# Patient Record
Sex: Female | Born: 1995
Health system: Southern US, Community
[De-identification: ages and names within clinical notes are randomized; demographics above are authoritative.]

## PROBLEM LIST (undated history)

## (undated) DIAGNOSIS — R011 Cardiac murmur, unspecified: Secondary | ICD-10-CM

## (undated) DIAGNOSIS — Z87442 Personal history of urinary calculi: Secondary | ICD-10-CM

## (undated) DIAGNOSIS — J45909 Unspecified asthma, uncomplicated: Secondary | ICD-10-CM

## (undated) DIAGNOSIS — F419 Anxiety disorder, unspecified: Secondary | ICD-10-CM

## (undated) DIAGNOSIS — R519 Headache, unspecified: Secondary | ICD-10-CM

## (undated) DIAGNOSIS — M419 Scoliosis, unspecified: Secondary | ICD-10-CM

## (undated) DIAGNOSIS — D649 Anemia, unspecified: Secondary | ICD-10-CM

## (undated) DIAGNOSIS — N281 Cyst of kidney, acquired: Secondary | ICD-10-CM

## (undated) DIAGNOSIS — N83209 Unspecified ovarian cyst, unspecified side: Secondary | ICD-10-CM

## (undated) DIAGNOSIS — F32A Depression, unspecified: Secondary | ICD-10-CM

## (undated) HISTORY — PX: TONSILLECTOMY: SUR1361

---
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
## 2017-04-15 ENCOUNTER — Encounter (HOSPITAL_COMMUNITY): Payer: Self-pay | Admitting: Emergency Medicine

## 2017-04-15 ENCOUNTER — Emergency Department (HOSPITAL_COMMUNITY)
Admission: EM | Admit: 2017-04-15 | Discharge: 2017-04-15 | Disposition: A | Discharge status: Home / Self Care | Payer: Self-pay | Attending: Emergency Medicine | Admitting: Emergency Medicine

## 2017-04-15 DIAGNOSIS — J029 Acute pharyngitis, unspecified: Secondary | ICD-10-CM | POA: Insufficient documentation

## 2017-04-15 LAB — RAPID STREP SCREEN (MED CTR MEBANE ONLY): Streptococcus, Group A Screen (Direct): NEGATIVE

## 2017-04-15 NOTE — ED Triage Notes (Signed)
Patient presents with S/S cold. Scratchy throat, ear ache cough x 1 day

## 2017-04-15 NOTE — ED Provider Notes (Signed)
[facility] Provider Note   CSN: [id number] Arrival date & time: 04/15/17  1620     History   Chief Complaint Chief Complaint  Patient presents with  . Cough    cold like S/S    HPI Jill Patel is a 21 y.o. female.  HPI   21 year old female presents today with complaints of upper respiratory infection.  Patient notes rhinorrhea and sore throat yesterday.  She notes pain in the bilateral ears.  She denies any fever, productive cough, chest pain or shortness of breath.  No nausea vomiting or abdominal pain.  Patient reports numerous close sick contacts with similar symptoms.  Patient notes history of tonsillectomy in the past.  No medications prior to arrival.       Past Medical History:  Diagnosis Date  . Asthma     There are no active problems to display for this patient.   Past Surgical History:  Procedure Laterality Date  . TONSILLECTOMY      OB History    Gravida Para Term Preterm AB Living   0             SAB TAB Ectopic Multiple Live Births                   Home Medications    Prior to Admission medications   Not on File    Family History Family History  Problem Relation Age of Onset  . Diabetes Maternal Grandfather   . Heart disease Maternal Grandfather   . Cancer Maternal Grandfather     Social History Social History  Substance Use Topics  . Smoking status: Never Smoker  . Smokeless tobacco: Never Used  . Alcohol use No     Allergies   Kiwi extract; Aleve [naproxen sodium]; Mango flavor; Other; and Pineapple   Review of Systems Review of Systems  All other systems reviewed and are negative.    Physical Exam Updated Vital Signs BP 101/63 (BP Location: Left Arm)   Pulse 82   Temp 98.4 F (36.9 C) (Oral)   Resp 16   LMP 03/18/2017   SpO2 100%   Physical Exam  Constitutional: She is oriented to person, place, and time. She appears well-developed and well-nourished.  HENT:  Head: Normocephalic and  atraumatic.  Eyes: Pupils are equal, round, and reactive to light. Conjunctivae are normal. Right eye exhibits no discharge. Left eye exhibits no discharge. No scleral icterus.  Neck: Normal range of motion. No JVD present. No tracheal deviation present.  Cardiovascular: Normal rate, regular rhythm, normal heart sounds and intact distal pulses.  Exam reveals no gallop and no friction rub.   No murmur heard. Pulmonary/Chest: Effort normal and breath sounds normal. No stridor. No respiratory distress. She has no wheezes. She has no rales. She exhibits no tenderness.  Neurological: She is alert and oriented to person, place, and time. Coordination normal.  Psychiatric: She has a normal mood and affect. Her behavior is normal. Judgment and thought content normal.  Nursing note and vitals reviewed.    ED Treatments / Results  Labs (all labs ordered are listed, but only abnormal results are displayed) Labs Reviewed  RAPID STREP SCREEN (NOT AT [facility])  CULTURE, GROUP A STREP [facility])    EKG  EKG Interpretation None       Radiology No results found.  Procedures Procedures (including critical care time)  Medications Ordered in ED Medications - No data to display   Initial Impression / Assessment and Plan /  ED Course  I have reviewed the triage vital signs and the nursing notes.  Pertinent labs & imaging results that were available during my care of the patient were reviewed by me and considered in my medical decision making (see chart for details).      Final Clinical Impressions(s) / ED Diagnoses   Final diagnoses:  Viral pharyngitis    Labs:   Imaging:  Consults:  Therapeutics:  Discharge Meds:   Assessment/Plan: 21 year old female presents today with likely viral URI.  She is well-appearing in no acute distress.  No signs of bacterial infection.  Symptomatic care instructions given strict return precautions given.  Patient verbalized understanding and agreement to  today's plan had no further questions or concerns the time discharge.      New Prescriptions There are no discharge medications for this patient.    Eyvonne Mechanic, PA-C 04/15/17 2038    Pricilla Loveless, MD 04/17/17 [phone]

## 2017-04-15 NOTE — Discharge Instructions (Signed)
Please read attached information. If you experience any new or worsening signs or symptoms please return to the emergency room for evaluation. Please follow-up with your primary care provider or specialist as discussed.  °

## 2017-04-18 LAB — CULTURE, GROUP A STREP (THRC)

## 2017-11-06 ENCOUNTER — Ambulatory Visit (HOSPITAL_COMMUNITY)
Admission: EM | Admit: 2017-11-06 | Discharge: 2017-11-06 | Disposition: A | Discharge status: Home / Self Care | Payer: BC Managed Care – PPO | Attending: Family Medicine | Admitting: Family Medicine

## 2017-11-06 ENCOUNTER — Encounter (HOSPITAL_COMMUNITY): Payer: Self-pay | Admitting: Emergency Medicine

## 2017-11-06 ENCOUNTER — Other Ambulatory Visit: Payer: Self-pay

## 2017-11-06 DIAGNOSIS — J321 Chronic frontal sinusitis: Secondary | ICD-10-CM | POA: Diagnosis not present

## 2017-11-06 MED ORDER — AMOXICILLIN-POT CLAVULANATE 875-125 MG PO TABS: 1.0000 | ORAL_TABLET | Freq: Two times a day (BID) | ORAL | 0 refills | Status: DC

## 2017-11-06 MED ORDER — PREDNISONE 10 MG PO TABS: 40.0000 mg | ORAL_TABLET | Freq: Every day | ORAL | 0 refills | Status: AC

## 2017-11-06 NOTE — ED Triage Notes (Signed)
C/o "stuffy" nose, slight cough and sore throat

## 2017-11-06 NOTE — ED Provider Notes (Signed)
[facility]    CSN: [id number] Arrival date & time: 11/06/17  1814     History   Chief Complaint Chief Complaint  Patient presents with  . Facial Pain    HPI Jill Patel is a 22 y.o. female.   22 year old female, with history of asthma, presenting today due to possible sinusitis.  Patient has had facial pressure, nasal congestion and cough for the past week.  Symptoms been gradually worsening.  Patient does have a history of recurrent sinusitis.  Denies any fever or chills.  The history is provided by the patient.  URI  Presenting symptoms: congestion and cough   Presenting symptoms: no ear pain, no fever and no sore throat   Severity:  Moderate Onset quality:  Gradual Duration:  1 week Timing:  Constant Progression:  Worsening Chronicity:  Recurrent Relieved by:  Nothing Worsened by:  Nothing Ineffective treatments:  None tried Associated symptoms: sinus pain   Associated symptoms: no arthralgias, no headaches, no myalgias, no neck pain, no sneezing and no swollen glands   Risk factors: sick contacts   Risk factors: not elderly, no chronic cardiac disease, no chronic kidney disease, no chronic respiratory disease, no diabetes mellitus, no recent illness and no recent travel     Past Medical History:  Diagnosis Date  . Asthma     There are no active problems to display for this patient.   Past Surgical History:  Procedure Laterality Date  . TONSILLECTOMY      OB History    Gravida  0   Para      Term      Preterm      AB      Living        SAB      TAB      Ectopic      Multiple      Live Births               Home Medications    Prior to Admission medications   Medication Sig Start Date End Date Taking? Authorizing Provider  amoxicillin-clavulanate (AUGMENTIN) 875-125 MG tablet Take 1 tablet by mouth every 12 (twelve) hours. 11/06/17   Ahyana Skillin C, PA-C  predniSONE (DELTASONE) 10 MG tablet Take 4 tablets (40 mg  total) by mouth daily for 5 days. 11/06/17 11/11/17  Alecia Lemming, PA-C    Family History Family History  Problem Relation Age of Onset  . Diabetes Maternal Grandfather   . Heart disease Maternal Grandfather   . Cancer Maternal Grandfather     Social History Social History   Tobacco Use  . Smoking status: Never Smoker  . Smokeless tobacco: Never Used  Substance Use Topics  . Alcohol use: No  . Drug use: No     Allergies   Kiwi extract; Aleve [naproxen sodium]; Mango flavor; Other; and Pineapple   Review of Systems Review of Systems  Constitutional: Negative for chills and fever.  HENT: Positive for congestion and sinus pain. Negative for ear pain, sneezing and sore throat.   Eyes: Negative for pain and visual disturbance.  Respiratory: Positive for cough. Negative for shortness of breath.   Cardiovascular: Negative for chest pain and palpitations.  Gastrointestinal: Negative for abdominal pain and vomiting.  Genitourinary: Negative for dysuria and hematuria.  Musculoskeletal: Negative for arthralgias, back pain, myalgias and neck pain.  Skin: Negative for color change and rash.  Neurological: Negative for seizures, syncope and headaches.  All other systems reviewed  and are negative.    Physical Exam Triage Vital Signs ED Triage Vitals [11/06/17 1909]  Enc Vitals Group     BP 108/70     Pulse Rate 84     Resp      Temp 97.8 F (36.6 C)     Temp Source Oral     SpO2 100 %     Weight      Height      Head Circumference      Peak Flow      Pain Score      Pain Loc      Pain Edu?      Excl. in GC?    No data found.  Updated Vital Signs BP 108/70 (BP Location: Left Arm)   Pulse 84   Temp 97.8 F (36.6 C) (Oral)   LMP 11/04/2017   SpO2 100%   Visual Acuity Right Eye Distance:   Left Eye Distance:   Bilateral Distance:    Right Eye Near:   Left Eye Near:    Bilateral Near:     Physical Exam  Constitutional: She appears well-developed and  well-nourished. No distress.  HENT:  Head: Normocephalic and atraumatic.  Right Ear: Hearing, tympanic membrane, external ear and ear canal normal.  Left Ear: Hearing, tympanic membrane, external ear and ear canal normal.  Nose: Right sinus exhibits maxillary sinus tenderness and frontal sinus tenderness. Left sinus exhibits maxillary sinus tenderness and frontal sinus tenderness.  Mouth/Throat: Oropharynx is clear and moist. No oropharyngeal exudate, posterior oropharyngeal edema, posterior oropharyngeal erythema or tonsillar abscesses.  Eyes: Conjunctivae are normal.  Neck: Neck supple.  Cardiovascular: Normal rate and regular rhythm.  No murmur heard. Pulmonary/Chest: Effort normal and breath sounds normal. No stridor. No respiratory distress. She has no wheezes. She has no rhonchi. She has no rales.  Abdominal: Soft. There is no tenderness.  Musculoskeletal: She exhibits no edema.  Neurological: She is alert.  Skin: Skin is warm and dry.  Psychiatric: She has a normal mood and affect.  Nursing note and vitals reviewed.    UC Treatments / Results  Labs (all labs ordered are listed, but only abnormal results are displayed) Labs Reviewed - No data to display  EKG None Radiology No results found.  Procedures Procedures (including critical care time)  Medications Ordered in UC Medications - No data to display   Initial Impression / Assessment and Plan / UC Course  I have reviewed the triage vital signs and the nursing notes.  Pertinent labs & imaging results that were available during my care of the patient were reviewed by me and considered in my medical decision making (see chart for details).     Sinusitis - will treat with augmentin and prednisone  Final Clinical Impressions(s) / UC Diagnoses   Final diagnoses:  Chronic frontal sinusitis    ED Discharge Orders        Ordered    amoxicillin-clavulanate (AUGMENTIN) 875-125 MG tablet  Every 12 hours      11/06/17 1915    predniSONE (DELTASONE) 10 MG tablet  Daily     11/06/17 1915       Controlled Substance Prescriptions [place] Controlled Substance Registry consulted? Not Applicable   Cameron AliBlue, Starlina Lapre C, PA-C 11/06/17 1922

## 2017-12-26 ENCOUNTER — Encounter (HOSPITAL_COMMUNITY): Payer: Self-pay | Admitting: Family Medicine

## 2017-12-26 ENCOUNTER — Ambulatory Visit (HOSPITAL_COMMUNITY)
Admission: EM | Admit: 2017-12-26 | Discharge: 2017-12-26 | Disposition: A | Discharge status: Home / Self Care | Payer: BC Managed Care – PPO | Attending: Family Medicine | Admitting: Family Medicine

## 2017-12-26 DIAGNOSIS — B354 Tinea corporis: Secondary | ICD-10-CM

## 2017-12-26 MED ORDER — KETOCONAZOLE 2 % EX CREA: 1.0000 "application " | TOPICAL_CREAM | Freq: Every day | CUTANEOUS | 0 refills | Status: DC

## 2017-12-26 NOTE — ED Provider Notes (Signed)
[facility]    CSN: [id number] Arrival date & time: 12/26/17  1758     History   Chief Complaint Chief Complaint  Patient presents with  . Rash    HPI Jill Patel is a 22 y.o. female.   HPI  Patient's had a rash for 2 weeks.  It is present on her arms chest and back.  It does not itch.  She has not used any creams.  She is here because it appears to be getting worse.  She is in good health and on no medications.  Past Medical History:  Diagnosis Date  . Asthma     There are no active problems to display for this patient.   Past Surgical History:  Procedure Laterality Date  . TONSILLECTOMY      OB History    Gravida  0   Para      Term      Preterm      AB      Living        SAB      TAB      Ectopic      Multiple      Live Births               Home Medications    Prior to Admission medications   Medication Sig Start Date End Date Taking? Authorizing Provider  ketoconazole (NIZORAL) 2 % cream Apply 1 application topically daily. 12/26/17   Eustace MooreNelson, Aristea Posada Sue, MD    Family History Family History  Problem Relation Age of Onset  . Diabetes Maternal Grandfather   . Heart disease Maternal Grandfather   . Cancer Maternal Grandfather     Social History Social History   Tobacco Use  . Smoking status: Never Smoker  . Smokeless tobacco: Never Used  Substance Use Topics  . Alcohol use: No  . Drug use: No     Allergies   Kiwi extract; Aleve [naproxen sodium]; Mango flavor; Other; and Pineapple   Review of Systems Review of Systems  Constitutional: Negative for chills and fever.  HENT: Negative for ear pain and sore throat.   Eyes: Negative for pain and visual disturbance.  Respiratory: Negative for cough and shortness of breath.   Cardiovascular: Negative for chest pain and palpitations.  Gastrointestinal: Negative for abdominal pain and vomiting.  Genitourinary: Negative for dysuria and hematuria.    Musculoskeletal: Negative for arthralgias and back pain.  Skin: Positive for rash. Negative for color change.  Neurological: Negative for seizures and syncope.  All other systems reviewed and are negative.    Physical Exam Triage Vital Signs ED Triage Vitals  Enc Vitals Group     BP 12/26/17 1818 105/63     Pulse Rate 12/26/17 1818 80     Resp 12/26/17 1818 18     Temp 12/26/17 1818 98.2 F (36.8 C)     Temp src --      SpO2 12/26/17 1818 100 %     Weight --      Height --      Head Circumference --      Peak Flow --      Pain Score 12/26/17 1817 0     Pain Loc --      Pain Edu? --      Excl. in GC? --    No data found.  Updated Vital Signs BP 105/63   Pulse 80   Temp 98.2 F (36.8 C)  Resp 18   SpO2 100%   Visual Acuity Right Eye Distance:   Left Eye Distance:   Bilateral Distance:    Right Eye Near:   Left Eye Near:    Bilateral Near:     Physical Exam  Constitutional: She appears well-developed and well-nourished. No distress.  HENT:  Head: Normocephalic and atraumatic.  Mouth/Throat: Oropharynx is clear and moist.  Eyes: Pupils are equal, round, and reactive to light. Conjunctivae are normal.  Neck: Normal range of motion.  Cardiovascular: Normal rate.  Pulmonary/Chest: Effort normal. No respiratory distress.  Abdominal: Soft. She exhibits no distension.  Musculoskeletal: Normal range of motion. She exhibits no edema.  Neurological: She is alert.  Skin: Skin is warm and dry.  On skin of right bicep, left breast, and mid back there are multiple circular to ovoid regions.  Annular.  Scaling around the edge and clear in the center.     UC Treatments / Results  Labs (all labs ordered are listed, but only abnormal results are displayed) Labs Reviewed - No data to display  EKG None  Radiology No results found.  Procedures Procedures (including critical care time)  Medications Ordered in UC Medications - No data to display  Initial  Impression / Assessment and Plan / UC Course  I have reviewed the triage vital signs and the nursing notes.  Pertinent labs & imaging results that were available during my care of the patient were reviewed by me and considered in my medical decision making (see chart for details).     Discussed tinea corporis.  Causes, treatment, care Final Clinical Impressions(s) / UC Diagnoses   Final diagnoses:  Tinea corporis     Discharge Instructions     Apply once a day Return as needed   ED Prescriptions    Medication Sig Dispense Auth. Provider   ketoconazole (NIZORAL) 2 % cream Apply 1 application topically daily. 15 g Eustace Moore, MD     Controlled Substance Prescriptions [place] Controlled Substance Registry consulted? Not Applicable   Eustace Moore, MD 12/26/17 [phone]

## 2017-12-26 NOTE — Discharge Instructions (Signed)
Apply once a day Return as needed

## 2017-12-26 NOTE — ED Triage Notes (Signed)
Pt here for circular dry patches to skin. 1 on her right arm, 1 on leg and 2 on breast. sts they don't itch and this has been x 1 month.

## 2018-05-02 ENCOUNTER — Other Ambulatory Visit: Payer: Self-pay

## 2018-05-02 ENCOUNTER — Encounter (HOSPITAL_COMMUNITY): Payer: Self-pay

## 2018-05-02 ENCOUNTER — Emergency Department (HOSPITAL_COMMUNITY)
Admission: EM | Admit: 2018-05-02 | Discharge: 2018-05-03 | Disposition: A | Discharge status: Home / Self Care | Payer: BC Managed Care – PPO | Attending: Emergency Medicine | Admitting: Emergency Medicine

## 2018-05-02 DIAGNOSIS — J45909 Unspecified asthma, uncomplicated: Secondary | ICD-10-CM | POA: Insufficient documentation

## 2018-05-02 DIAGNOSIS — T7621XA Adult sexual abuse, suspected, initial encounter: Secondary | ICD-10-CM | POA: Diagnosis present

## 2018-05-02 DIAGNOSIS — T7421XA Adult sexual abuse, confirmed, initial encounter: Secondary | ICD-10-CM | POA: Diagnosis not present
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
## 2018-05-03 LAB — PREGNANCY, URINE: PREG TEST UR: NEGATIVE

## 2018-05-03 LAB — POC URINE PREG, ED: Preg Test, Ur: NEGATIVE

## 2018-05-03 MED ORDER — ULIPRISTAL ACETATE 30 MG PO TABS
30.0000 mg | ORAL_TABLET | Freq: Once | ORAL | Status: AC
  Administered 2018-05-03: 30 mg via ORAL
  Filled 2018-05-03: qty 1

## 2018-05-03 MED ORDER — ONDANSETRON 8 MG PO TBDP
8.0000 mg | ORAL_TABLET | Freq: Once | ORAL | Status: AC
  Administered 2018-05-03: 8 mg via ORAL
  Filled 2018-05-03: qty 1
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
## 2018-05-03 NOTE — SANE Note (Signed)
Follow-up Phone Call  Patient gives verbal consent for a FNE/SANE follow-up phone call in 48-72 hours: NO Patient's telephone number: [phone] Patient gives verbal consent to leave voicemail at the phone number listed above: No DO NOT CALL between the hours of: No consent to call

## 2018-07-08 IMAGING — US US ART/VEN ABD/PELV/SCROTUM DOPPLER LTD
1 series · 13 of 25 positions shown · non-contrast
Comparison: None

CLINICAL DATA: Left lower quadrant pain for 1 day

EXAM:
TRANSABDOMINAL AND TRANSVAGINAL ULTRASOUND OF PELVIS
DOPPLER ULTRASOUND OF OVARIES
TECHNIQUE: Both transabdominal and transvaginal ultrasound examinations of the
pelvis were performed. Transabdominal technique was performed for
global imaging of the pelvis including uterus, ovaries, adnexal
regions, and pelvic cul-de-sac.
It was necessary to proceed with endovaginal exam following the
transabdominal exam to visualize the uterus and right ovary. Color
and duplex Doppler ultrasound was utilized to evaluate blood flow to
the ovaries.

[Series 1: us art/ven abd/pelv/scrotum doppler ltd · 0.20mm/px · 13 of 106 slices shown]
[im 1/106]
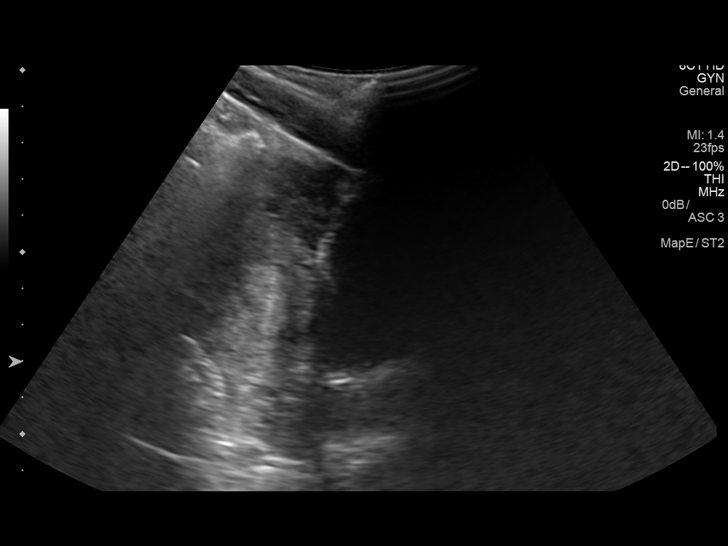
[im 9/106]
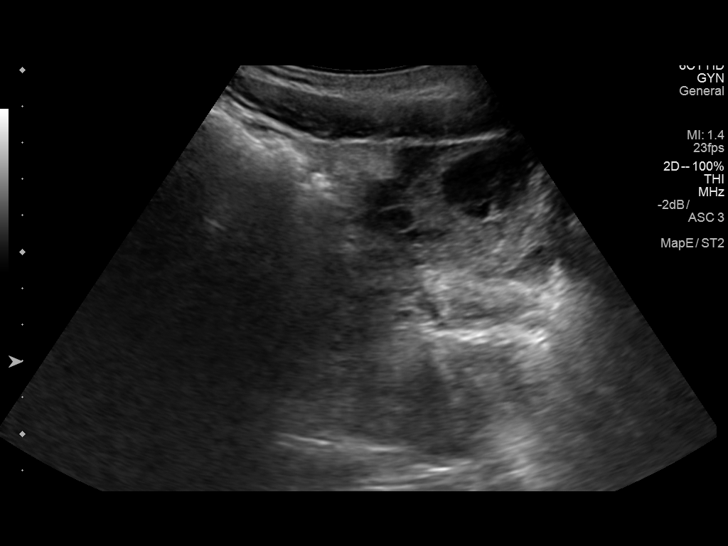
[im 18/106]
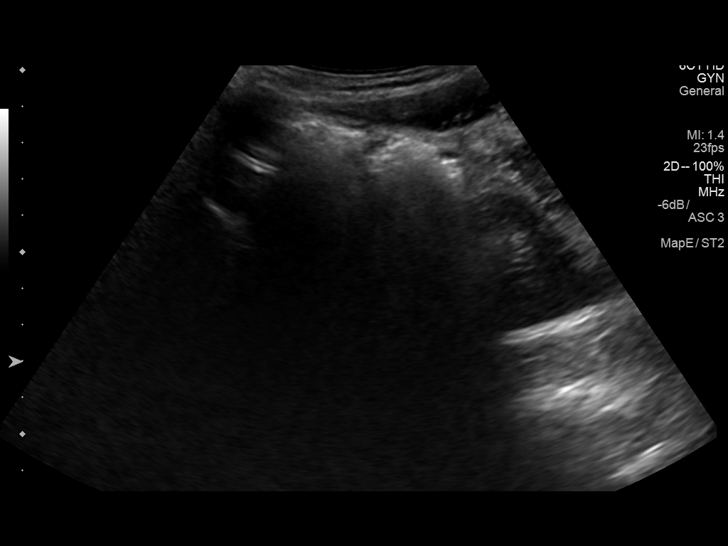
[im 27/106]
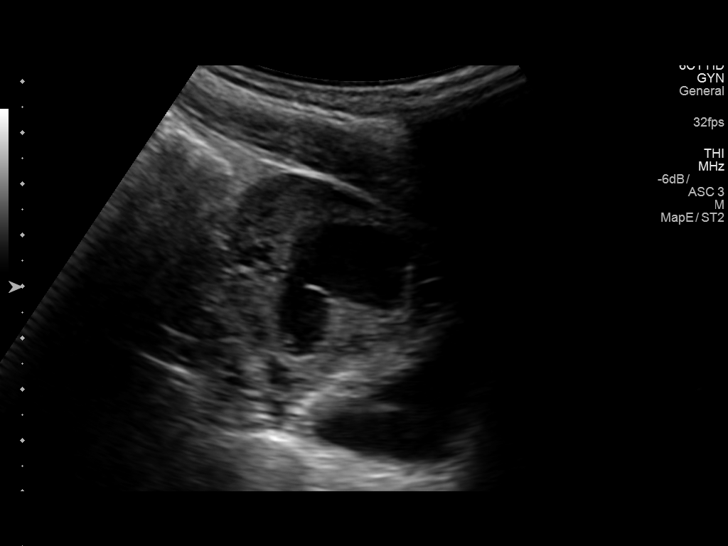
[im 36/106]
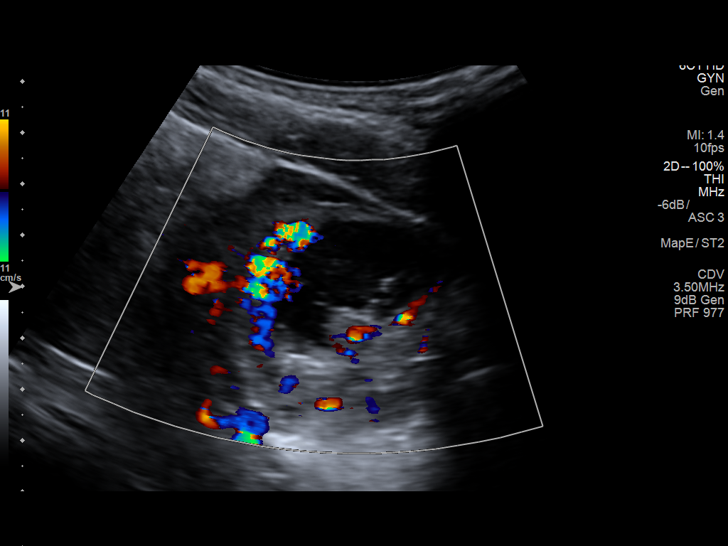
[im 44/106]
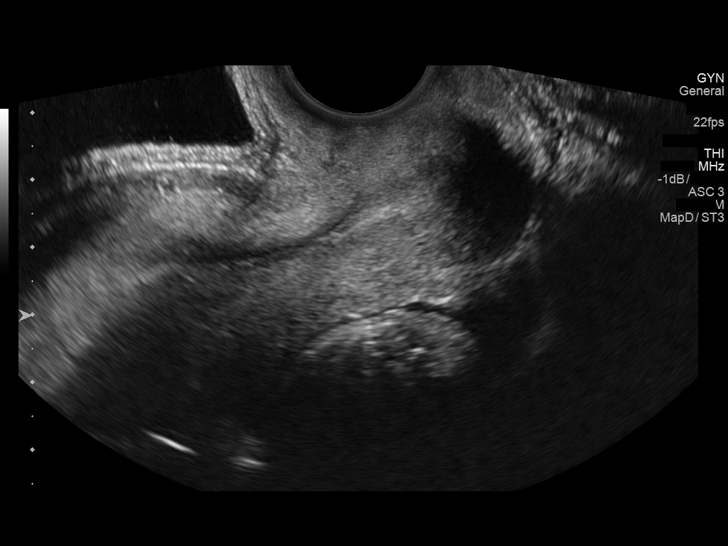
[im 53/106]
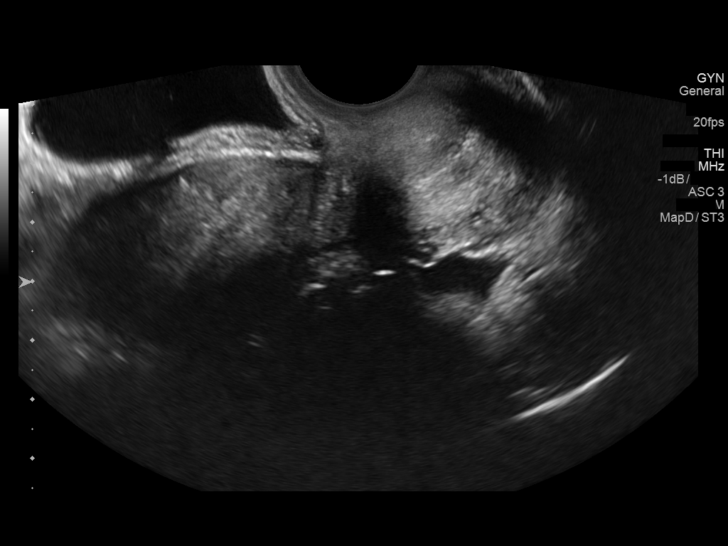
[im 62/106]
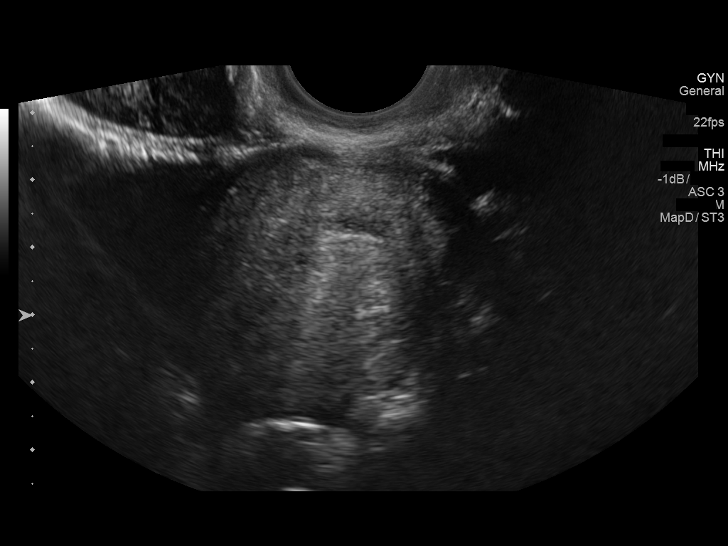
[im 71/106]
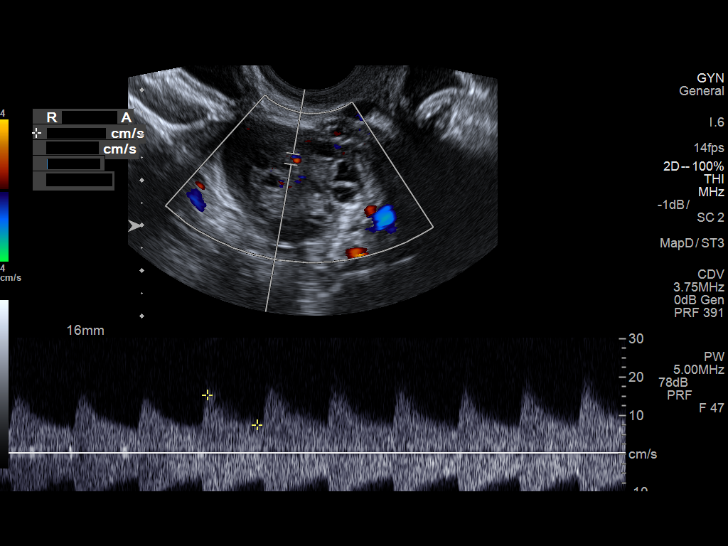
[im 79/106]
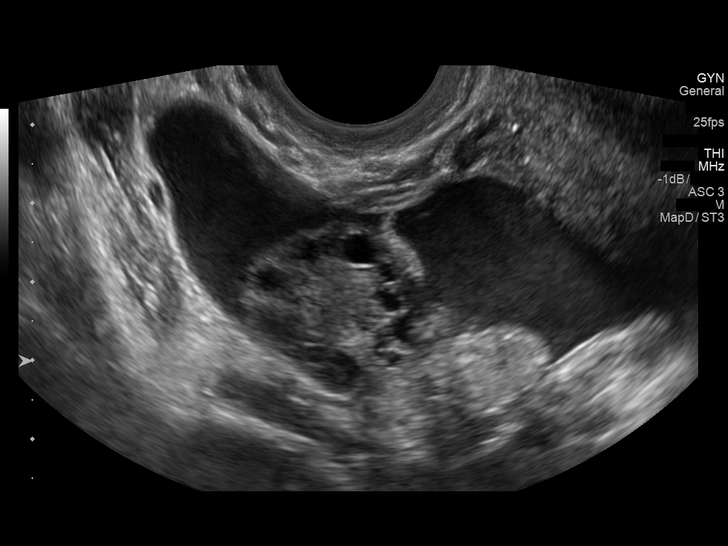
[im 88/106]
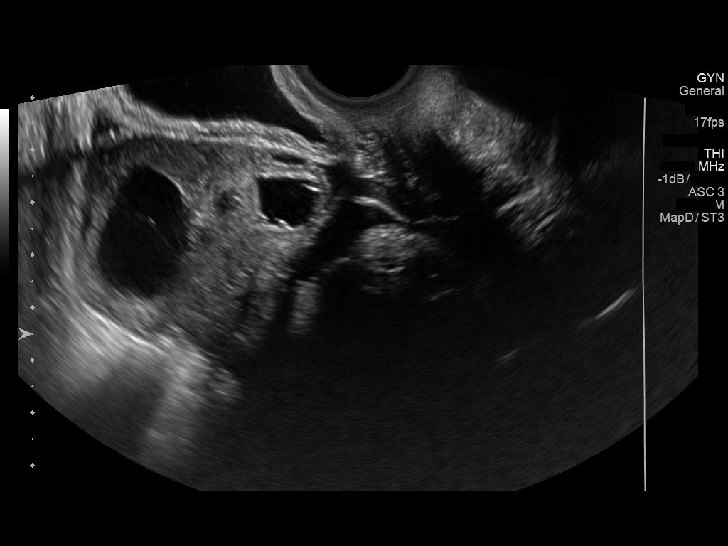
[im 97/106]
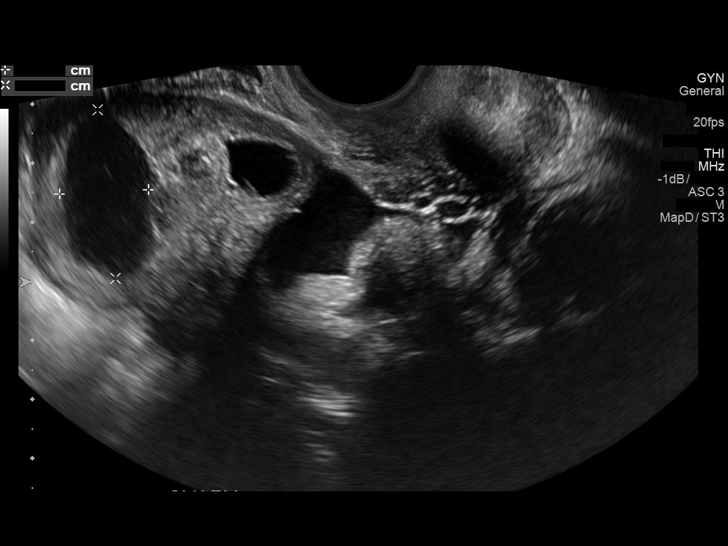
[im 106/106]
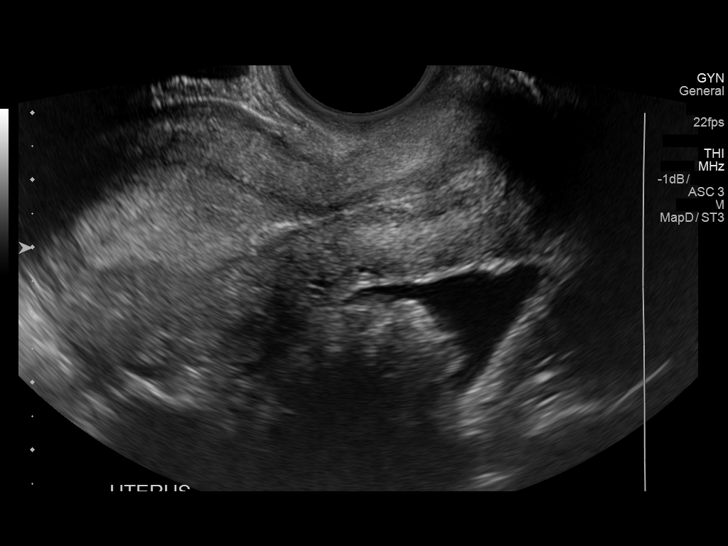

[13 of 25 positions shown; findings below may reference images not displayed]

FINDINGS: Uterus

Measurements: 6.7 x 3.7 x 4.4 cm. No fibroids or other mass
visualized.

Endometrium

Thickness: 12 mm.  No focal abnormality visualized.

Right ovary

Measurements: 2.4 x 2.3 x 2.2 cm. Normal appearance/no adnexal mass.

Left ovary

Measurements: 5.3 x 4 x 3.4 cm. Mildly complex cyst in the left
ovary measuring 1.5 x 2.9 x 2 cm.

Pulsed Doppler evaluation of both ovaries demonstrates normal
low-resistance arterial and venous waveforms.

Other findings

Small amount of free fluid within the pelvis and bilateral  adnexa.
IMPRESSION: 1. No sonographic evidence for ovarian torsion
2. Small free fluid in the pelvis and bilateral adnexum. Complex
appearing cyst left ovary measuring up to 2.9 cm, possible
hemorrhagic cyst.

## 2020-10-12 ENCOUNTER — Ambulatory Visit
Admission: EM | Admit: 2020-10-12 | Discharge: 2020-10-12 | Disposition: A | Discharge status: Home / Self Care | Payer: BC Managed Care – PPO | Attending: Emergency Medicine | Admitting: Emergency Medicine

## 2020-10-12 ENCOUNTER — Other Ambulatory Visit: Payer: Self-pay

## 2020-10-12 DIAGNOSIS — N898 Other specified noninflammatory disorders of vagina: Secondary | ICD-10-CM | POA: Insufficient documentation

## 2020-10-12 DIAGNOSIS — R102 Pelvic and perineal pain: Secondary | ICD-10-CM | POA: Insufficient documentation

## 2020-10-12 DIAGNOSIS — N926 Irregular menstruation, unspecified: Secondary | ICD-10-CM | POA: Insufficient documentation

## 2020-10-12 LAB — POCT URINE PREGNANCY: Preg Test, Ur: NEGATIVE

## 2020-10-12 MED ORDER — ONDANSETRON 4 MG PO TBDP: 4.0000 mg | ORAL_TABLET | Freq: Three times a day (TID) | ORAL | 0 refills | Status: DC | PRN

## 2020-10-12 MED ORDER — ACETAMINOPHEN-CAFF-PYRILAMINE 500-60-15 MG PO TABS: 2.0000 | ORAL_TABLET | Freq: Three times a day (TID) | ORAL | 0 refills | Status: DC | PRN

## 2020-10-12 NOTE — ED Provider Notes (Signed)
[facility]    CSN: [id number] Arrival date & time: 10/12/20  1417      History   Chief Complaint Chief Complaint  Patient presents with  . Amenorrhea    HPI Jill Patel is a 25 y.o. female history of asthma presenting today for evaluation of late menstrual cycle..  Reports last menstrual cycle was mid February, was supposed to start on 3/19, but is approximately 5 days late.  She has had some cramping in lower abdomen recently which is typical for her menstrual cycles.  She is a nauseous in the mornings.  Reports some hot flashes and increased gas of recently.  She is not on birth control.  Does report increase in discharge recently, but denies other pelvic symptoms.  HPI  Past Medical History:  Diagnosis Date  . Asthma     There are no problems to display for this patient.   Past Surgical History:  Procedure Laterality Date  . TONSILLECTOMY      OB History    Gravida  0   Para      Term      Preterm      AB      Living        SAB      IAB      Ectopic      Multiple      Live Births               Home Medications    Prior to Admission medications   Medication Sig Start Date End Date Taking? Authorizing Provider  Acetaminophen-Caff-Pyrilamine 500-60-15 MG TABS Take 2 tablets by mouth every 8 (eight) hours as needed (cramping). 10/12/20  Yes Makisha Marrin C, PA-C  ondansetron (ZOFRAN ODT) 4 MG disintegrating tablet Take 1 tablet (4 mg total) by mouth every 8 (eight) hours as needed for nausea or vomiting. 10/12/20  Yes Iqra Rotundo, Barrett C, PA-C    Family History Family History  Problem Relation Age of Onset  . Diabetes Maternal Grandfather   . Heart disease Maternal Grandfather   . Cancer Maternal Grandfather     Social History Social History   Tobacco Use  . Smoking status: Never Smoker  . Smokeless tobacco: Never Used  Vaping Use  . Vaping Use: Some days  . Substances: Nicotine, Flavoring  Substance Use  Topics  . Alcohol use: No  . Drug use: No     Allergies   Kiwi extract, Aleve [naproxen sodium], Mango flavor, Other, and Pineapple   Review of Systems Review of Systems  Constitutional: Negative for fever.  Respiratory: Negative for shortness of breath.   Cardiovascular: Negative for chest pain.  Gastrointestinal: Positive for nausea. Negative for abdominal pain, diarrhea and vomiting.  Genitourinary: Positive for menstrual problem. Negative for dysuria, flank pain, genital sores, hematuria, vaginal bleeding, vaginal discharge and vaginal pain.  Musculoskeletal: Negative for back pain.  Skin: Negative for rash.  Neurological: Negative for dizziness, light-headedness and headaches.     Physical Exam Triage Vital Signs ED Triage Vitals  Enc Vitals Group     BP 10/12/20 1511 109/71     Pulse Rate 10/12/20 1511 77     Resp 10/12/20 1511 18     Temp 10/12/20 1511 98.3 F (36.8 C)     Temp Source 10/12/20 1511 Oral     SpO2 10/12/20 1511 98 %     Weight --      Height --  Head Circumference --      Peak Flow --      Pain Score 10/12/20 1509 0     Pain Loc --      Pain Edu? --      Excl. in GC? --    No data found.  Updated Vital Signs BP 109/71 (BP Location: Left Arm)   Pulse 77   Temp 98.3 F (36.8 C) (Oral)   Resp 18   LMP 09/08/2020 (Exact Date)   SpO2 98%   Visual Acuity Right Eye Distance:   Left Eye Distance:   Bilateral Distance:    Right Eye Near:   Left Eye Near:    Bilateral Near:     Physical Exam Vitals and nursing note reviewed.  Constitutional:      Appearance: She is well-developed.     Comments: No acute distress  HENT:     Head: Normocephalic and atraumatic.     Nose: Nose normal.  Eyes:     Conjunctiva/sclera: Conjunctivae normal.  Cardiovascular:     Rate and Rhythm: Normal rate.  Pulmonary:     Effort: Pulmonary effort is normal. No respiratory distress.  Abdominal:     General: There is no distension.  Musculoskeletal:         General: Normal range of motion.     Cervical back: Neck supple.  Skin:    General: Skin is warm and dry.  Neurological:     Mental Status: She is alert and oriented to person, place, and time.      UC Treatments / Results  Labs (all labs ordered are listed, but only abnormal results are displayed) Labs Reviewed  POCT URINE PREGNANCY  CERVICOVAGINAL ANCILLARY ONLY    EKG   Radiology No results found.  Procedures Procedures (including critical care time)  Medications Ordered in UC Medications - No data to display  Initial Impression / Assessment and Plan / UC Course  I have reviewed the triage vital signs and the nursing notes.  Pertinent labs & imaging results that were available during my care of the patient were reviewed by me and considered in my medical decision making (see chart for details).     Pregnancy test negative, screening for STDs with vaginal swab, treating symptomatically and supportively with Zofran and Midol as needed for nausea and cramping.  Continue to monitor for onset of cycle and follow-up with OB/GYN if continuing to have amenorrhea.  Discussed strict return precautions. Patient verbalized understanding and is agreeable with plan.  Final Clinical Impressions(s) / UC Diagnoses   Final diagnoses:  Pelvic cramping  Vaginal discharge  Menstrual period late     Discharge Instructions     Pregnancy test negative Vaginal swab pending to screen for STDs/discharge causes Zofran as needed for nausea May use Midol as needed for cramping Follow-up if not improving or worsening   ED Prescriptions    Medication Sig Dispense Auth. Provider   ondansetron (ZOFRAN ODT) 4 MG disintegrating tablet Take 1 tablet (4 mg total) by mouth every 8 (eight) hours as needed for nausea or vomiting. 20 tablet Bertrum Helmstetter C, PA-C   Acetaminophen-Caff-Pyrilamine 500-60-15 MG TABS Take 2 tablets by mouth every 8 (eight) hours as needed (cramping). 20  tablet Aubry Rankin, [place] C, PA-C     PDMP not reviewed this encounter.   Lew Dawes, [place] 10/12/20 1552

## 2020-10-12 NOTE — Discharge Instructions (Addendum)
Pregnancy test negative Vaginal swab pending to screen for STDs/discharge causes Zofran as needed for nausea May use Midol as needed for cramping Follow-up if not improving or worsening

## 2020-10-12 NOTE — ED Triage Notes (Signed)
Pt presents for late period.  Was due for period on 03/19.  Took test on 03/21 and it was negative.  Has had unprotected intercourse in the last month.  Was told by sister that her food cravings are weird.  Also reports gas x 3 days and hot flashes.

## 2020-10-13 ENCOUNTER — Telehealth (HOSPITAL_COMMUNITY): Payer: Self-pay | Admitting: Emergency Medicine

## 2020-10-13 LAB — CERVICOVAGINAL ANCILLARY ONLY
Bacterial Vaginitis (gardnerella): POSITIVE — AB
Candida Glabrata: NEGATIVE
Candida Vaginitis: NEGATIVE
Chlamydia: POSITIVE — AB
Comment: NEGATIVE
Comment: NEGATIVE
Comment: NEGATIVE
Comment: NEGATIVE
Comment: NEGATIVE
Comment: NORMAL
Neisseria Gonorrhea: NEGATIVE
Trichomonas: POSITIVE — AB

## 2020-10-13 MED ORDER — DOXYCYCLINE HYCLATE 100 MG PO CAPS: 100.0000 mg | ORAL_CAPSULE | Freq: Two times a day (BID) | ORAL | 0 refills | Status: AC

## 2020-10-13 MED ORDER — METRONIDAZOLE 500 MG PO TABS: 500.0000 mg | ORAL_TABLET | Freq: Two times a day (BID) | ORAL | 0 refills | Status: DC

## 2020-11-10 ENCOUNTER — Emergency Department (HOSPITAL_COMMUNITY): Payer: BC Managed Care – PPO

## 2020-11-10 ENCOUNTER — Encounter (HOSPITAL_COMMUNITY): Payer: Self-pay | Admitting: *Deleted

## 2020-11-10 ENCOUNTER — Other Ambulatory Visit: Payer: Self-pay

## 2020-11-10 ENCOUNTER — Emergency Department (HOSPITAL_COMMUNITY)
Admission: EM | Admit: 2020-11-10 | Discharge: 2020-11-10 | Disposition: A | Discharge status: Home / Self Care | Payer: BC Managed Care – PPO | Attending: Emergency Medicine | Admitting: Emergency Medicine

## 2020-11-10 DIAGNOSIS — A749 Chlamydial infection, unspecified: Secondary | ICD-10-CM | POA: Insufficient documentation

## 2020-11-10 DIAGNOSIS — R1084 Generalized abdominal pain: Secondary | ICD-10-CM | POA: Diagnosis present

## 2020-11-10 DIAGNOSIS — N939 Abnormal uterine and vaginal bleeding, unspecified: Secondary | ICD-10-CM | POA: Diagnosis not present

## 2020-11-10 DIAGNOSIS — R102 Pelvic and perineal pain: Secondary | ICD-10-CM

## 2020-11-10 DIAGNOSIS — J45909 Unspecified asthma, uncomplicated: Secondary | ICD-10-CM | POA: Insufficient documentation

## 2020-11-10 HISTORY — DX: Cyst of kidney, acquired: N28.1

## 2020-11-10 HISTORY — DX: Unspecified ovarian cyst, unspecified side: N83.209

## 2020-11-10 LAB — CBC
HCT: 36.5 % (ref 36.0–46.0)
Hemoglobin: 12.2 g/dL (ref 12.0–15.0)
MCH: 31.2 pg (ref 26.0–34.0)
MCHC: 33.4 g/dL (ref 30.0–36.0)
MCV: 93.4 fL (ref 80.0–100.0)
Platelets: 215 10*3/uL (ref 150–400)
RBC: 3.91 MIL/uL (ref 3.87–5.11)
RDW: 11.8 % (ref 11.5–15.5)
WBC: 7.7 10*3/uL (ref 4.0–10.5)
nRBC: 0 % (ref 0.0–0.2)

## 2020-11-10 LAB — BASIC METABOLIC PANEL
Anion gap: 6 (ref 5–15)
BUN: 14 mg/dL (ref 6–20)
CO2: 25 mmol/L (ref 22–32)
Calcium: 9.2 mg/dL (ref 8.9–10.3)
Chloride: 109 mmol/L (ref 98–111)
Creatinine, Ser: 0.72 mg/dL (ref 0.44–1.00)
GFR, Estimated: >60 mL/min (ref >60)
Glucose, Bld: 95 mg/dL (ref 70–99)
Potassium: 3.6 mmol/L (ref 3.5–5.1)
Sodium: 140 mmol/L (ref 135–145)

## 2020-11-10 LAB — ABO/RH: ABO/RH(D): A POS

## 2020-11-10 LAB — HCG, QUANTITATIVE, PREGNANCY: hCG, Beta Chain, Quant, S: <1 m[IU]/mL (ref <5)

## 2020-11-10 MED ORDER — DOXYCYCLINE HYCLATE 100 MG PO CAPS: 100.0000 mg | ORAL_CAPSULE | Freq: Two times a day (BID) | ORAL | 0 refills | Status: DC

## 2020-11-10 MED ORDER — ONDANSETRON HCL 4 MG/2ML IJ SOLN
4.0000 mg | Freq: Once | INTRAMUSCULAR | Status: AC
  Administered 2020-11-10: 4 mg via INTRAVENOUS
  Filled 2020-11-10: qty 2

## 2020-11-10 NOTE — Discharge Instructions (Addendum)
Please take antibiotics as prescribed Recheck with a gyn doctor in the next 1-2 weeks

## 2020-11-10 NOTE — ED Triage Notes (Signed)
Pt states she is 2 months preg, started having abd cramps yesterday afternoon. This morning noticed some vaginal bleeding/spotting.

## 2020-11-10 NOTE — ED Provider Notes (Addendum)
Jill Patel Provider Note   CSN: [id number] Arrival date & time: 11/10/20  1103     History Chief Complaint  Patient presents with  . Vaginal Bleeding    Jill Patel is a 25 y.o. female.  HPI 25 year old G0 P1 last normal menstrual period in February, with last episode of vaginal bleeding in March.  She states [facility]  She been having some abdominal pain and vaginal bleeding.  It started as spotting she feels that it has increased now.  She is not lightheaded.  She denies any headache, head injury, chest pain, dyspnea, passing out, nausea, or vomiting.  She has not had any prenatal care    Past Medical History:  Diagnosis Date  . Asthma   . Kidney cysts   . Ovarian cyst     There are no problems to display for this patient.   Past Surgical History:  Procedure Laterality Date  . TONSILLECTOMY       OB History    Gravida  1   Para      Term      Preterm      AB      Living        SAB      IAB      Ectopic      Multiple      Live Births              Family History  Problem Relation Age of Onset  . Diabetes Maternal Grandfather   . Heart disease Maternal Grandfather   . Cancer Maternal Grandfather     Social History   Tobacco Use  . Smoking status: Never Smoker  . Smokeless tobacco: Never Used  Vaping Use  . Vaping Use: Some days  . Substances: Nicotine, Flavoring  Substance Use Topics  . Alcohol use: No  . Drug use: No    Home Medications Prior to Admission medications   Medication Sig Start Date End Date Taking? Authorizing Provider  Acetaminophen-Caff-Pyrilamine 500-60-15 MG TABS Take 2 tablets by mouth every 8 (eight) hours as needed (cramping). 10/12/20   Wieters, Hallie C, PA-C  metroNIDAZOLE (FLAGYL) 500 MG tablet Take 1 tablet (500 mg total) by mouth 2 (two) times daily. 10/13/20   Merrilee Jansky, MD  ondansetron (ZOFRAN  ODT) 4 MG disintegrating tablet Take 1 tablet (4 mg total) by mouth every 8 (eight) hours as needed for nausea or vomiting. 10/12/20   Wieters, Hallie C, PA-C    Allergies    Kiwi extract, Aleve [naproxen sodium], Mango flavor, Other, and Pineapple  Review of Systems   Review of Systems  All other systems reviewed and are negative.   Physical Exam Updated Vital Signs BP 130/84 (BP Location: Left Arm)   Pulse 97   Temp 98.7 F (37.1 C) (Oral)   Resp 18   Ht 1.6 m (5\' 3" )   Wt 54.9 kg   LMP 10/13/2020   SpO2 100%   BMI 21.43 kg/m   Physical Exam Vitals and nursing note reviewed. Exam conducted with a chaperone present.  Constitutional:      Appearance: Normal appearance.  HENT:     Head: Normocephalic.     Right Ear: External ear normal.     Left Ear: External ear normal.     Mouth/Throat:     Mouth: Mucous membranes are moist.  Eyes:  Extraocular Movements: Extraocular movements intact.     Pupils: Pupils are equal, round, and reactive to light.  Cardiovascular:     Rate and Rhythm: Normal rate and regular rhythm.     Pulses: Normal pulses.     Heart sounds: Normal heart sounds.  Pulmonary:     Effort: Pulmonary effort is normal.     Breath sounds: Normal breath sounds.  Abdominal:     Palpations: Abdomen is soft.     Tenderness: There is abdominal tenderness.     Comments: Mild diffuse tenderness to palpation  Genitourinary:    Vagina: Bleeding present.     Cervix: Cervical motion tenderness present.     Uterus: Enlarged.      Adnexa:        Right: No mass.         Left: No mass.    Musculoskeletal:     Cervical back: Normal range of motion.  Neurological:     Mental Status: She is alert.     ED Results / Procedures / Treatments   Labs (all labs ordered are listed, but only abnormal results are displayed) Labs Reviewed  CBC  HCG, QUANTITATIVE, PREGNANCY  BASIC METABOLIC PANEL  ABO/RH    EKG None  Radiology No results  found.  Procedures Procedures   Medications Ordered in ED Medications  ondansetron (ZOFRAN) injection 4 mg (has no administration in time range)    ED Course  I have reviewed the triage vital signs and the nursing notes.  Pertinent labs & imaging results that were available during my care of the patient were reviewed by me and considered in my medical decision making (see chart for details).    MDM Rules/Calculators/A&P                          25 year old female reports positive pregnancy test at [name] and at home.  She is having pelvic pain and bleeding.  Review of records reveal positive chlamydia and trichomonas March 24.  She reports being treated with Flagyl.  She does not report any other medication.  Patient with vaginal bleeding here.  Based on dates this would be consistent with a regular menstrual period Patient is having pelvic ultrasound performed now Pelvic US without acute abnormality Patient appears stable for d/c Final Clinical Impression(s) / ED Diagnoses Final diagnoses:  Vaginal bleeding  Chlamydia    Rx / DC Orders ED Discharge Orders         Ordered    doxycycline (VIBRAMYCIN) 100 MG capsule  2 times daily        11/10/20 1428           Margarita Grizzle, MD 11/10/20 1428    Margarita Grizzle, MD 11/10/20 1428

## 2021-05-28 ENCOUNTER — Other Ambulatory Visit: Payer: Self-pay

## 2021-05-28 ENCOUNTER — Emergency Department (HOSPITAL_BASED_OUTPATIENT_CLINIC_OR_DEPARTMENT_OTHER)
Admission: EM | Admit: 2021-05-28 | Discharge: 2021-05-28 | Disposition: A | Discharge status: Home / Self Care | Payer: BC Managed Care – PPO | Source: Home / Self Care | Attending: Emergency Medicine | Admitting: Emergency Medicine

## 2021-05-28 ENCOUNTER — Encounter (HOSPITAL_BASED_OUTPATIENT_CLINIC_OR_DEPARTMENT_OTHER): Payer: Self-pay | Admitting: *Deleted

## 2021-05-28 ENCOUNTER — Emergency Department (HOSPITAL_COMMUNITY)
Admission: EM | Admit: 2021-05-28 | Discharge: 2021-05-28 | Disposition: A | Discharge status: Home / Self Care | Payer: BC Managed Care – PPO | Attending: Emergency Medicine | Admitting: Emergency Medicine

## 2021-05-28 DIAGNOSIS — X58XXXA Exposure to other specified factors, initial encounter: Secondary | ICD-10-CM | POA: Insufficient documentation

## 2021-05-28 DIAGNOSIS — M545 Low back pain, unspecified: Secondary | ICD-10-CM | POA: Insufficient documentation

## 2021-05-28 DIAGNOSIS — S339XXA Sprain of unspecified parts of lumbar spine and pelvis, initial encounter: Secondary | ICD-10-CM | POA: Insufficient documentation

## 2021-05-28 DIAGNOSIS — J45909 Unspecified asthma, uncomplicated: Secondary | ICD-10-CM | POA: Diagnosis not present

## 2021-05-28 DIAGNOSIS — Z5321 Procedure and treatment not carried out due to patient leaving prior to being seen by health care provider: Secondary | ICD-10-CM | POA: Insufficient documentation

## 2021-05-28 DIAGNOSIS — S39012A Strain of muscle, fascia and tendon of lower back, initial encounter: Secondary | ICD-10-CM

## 2021-05-28 HISTORY — DX: Scoliosis, unspecified: M41.9

## 2021-05-28 MED ORDER — METHOCARBAMOL 500 MG PO TABS: 500.0000 mg | ORAL_TABLET | Freq: Once | ORAL | Status: DC

## 2021-05-28 MED ORDER — PREDNISONE 50 MG PO TABS
60.0000 mg | ORAL_TABLET | Freq: Once | ORAL | Status: AC
  Administered 2021-05-28: 60 mg via ORAL
  Filled 2021-05-28: qty 1

## 2021-05-28 MED ORDER — PREDNISONE 20 MG PO TABS: ORAL_TABLET | ORAL | 0 refills | Status: DC

## 2021-05-28 MED ORDER — CYCLOBENZAPRINE HCL 5 MG PO TABS: 5.0000 mg | ORAL_TABLET | Freq: Three times a day (TID) | ORAL | 0 refills | Status: DC | PRN

## 2021-05-28 MED ORDER — CYCLOBENZAPRINE HCL 5 MG PO TABS
5.0000 mg | ORAL_TABLET | Freq: Once | ORAL | Status: AC
  Administered 2021-05-28: 5 mg via ORAL
  Filled 2021-05-28: qty 1

## 2021-05-28 NOTE — ED Provider Notes (Signed)
[facility] EMERGENCY DEPARTMENT Provider Note   CSN: [id number] Arrival date & time: 05/28/21  1612     History Chief Complaint  Patient presents with   Back Pain    Jill Patel is a 25 y.o. female here presenting with back pain.  Patient has history of scoliosis.  She states that she woke up yesterday noticed some swelling in the left side of her lower back.  Denies any trauma or injury.  Patient has been taking ibuprofen with no relief.  Denies any numbness or weakness or incontinence.  Denies any urinary symptoms or being pregnant.  The history is provided by the patient.      Past Medical History:  Diagnosis Date   Asthma    Kidney cysts    Ovarian cyst    Scoliosis     There are no problems to display for this patient.   Past Surgical History:  Procedure Laterality Date   TONSILLECTOMY       OB History     Gravida  1   Para      Term      Preterm      AB      Living         SAB      IAB      Ectopic      Multiple      Live Births              Family History  Problem Relation Age of Onset   Diabetes Maternal Grandfather    Heart disease Maternal Grandfather    Cancer Maternal Grandfather     Social History   Tobacco Use   Smoking status: Never   Smokeless tobacco: Never  Vaping Use   Vaping Use: Some days   Substances: Nicotine, Flavoring  Substance Use Topics   Alcohol use: No   Drug use: No    Home Medications Prior to Admission medications   Medication Sig Start Date End Date Taking? Authorizing Provider  Acetaminophen-Caff-Pyrilamine 500-60-15 MG TABS Take 2 tablets by mouth every 8 (eight) hours as needed (cramping). Patient not taking: Reported on 11/10/2020 10/12/20   Wieters, Fran [place] C, PA-C  doxycycline (VIBRAMYCIN) 100 MG capsule Take 1 capsule (100 mg total) by mouth 2 (two) times daily. 11/10/20   Margarita Grizzle, MD  metroNIDAZOLE (FLAGYL) 500 MG tablet Take 1 tablet (500 mg total) by mouth 2  (two) times daily. Patient not taking: Reported on 11/10/2020 10/13/20   Merrilee Jansky, MD  ondansetron (ZOFRAN ODT) 4 MG disintegrating tablet Take 1 tablet (4 mg total) by mouth every 8 (eight) hours as needed for nausea or vomiting. Patient not taking: Reported on 11/10/2020 10/12/20   Wieters, Fran Lowes C, PA-C  Prenatal Vit-Fe Fumarate-FA (PRENATAL PO) Take 1 tablet by mouth daily.    [provider]    Allergies    Kiwi extract, Aleve [naproxen sodium], Mango flavor, Other, and Pineapple  Review of Systems   Review of Systems  Musculoskeletal:  Positive for back pain.  All other systems reviewed and are negative.  Physical Exam Updated Vital Signs BP (!) 105/58 (BP Location: Right Arm)   Pulse 67   Temp 98.6 F (37 C) (Oral)   Resp 16   Ht 5\' 3"  (1.6 m)   Wt 54.4 kg   LMP 05/14/2021 (Exact Date)   SpO2 100%   BMI 21.26 kg/m   Physical Exam Vitals and nursing note reviewed.  Constitutional:  Appearance: Normal appearance.     Comments: Slightly uncomfortable  HENT:     Head: Normocephalic.     Nose: Nose normal.     Mouth/Throat:     Mouth: Mucous membranes are moist.  Eyes:     Extraocular Movements: Extraocular movements intact.     Pupils: Pupils are equal, round, and reactive to light.  Cardiovascular:     Rate and Rhythm: Normal rate and regular rhythm.     Pulses: Normal pulses.     Heart sounds: Normal heart sounds.  Pulmonary:     Effort: Pulmonary effort is normal.     Breath sounds: Normal breath sounds.  Abdominal:     General: Abdomen is flat.     Palpations: Abdomen is soft.  Musculoskeletal:     Cervical back: Normal range of motion and neck supple.     Comments: Patient has slowly ulcers in the lower back.  Patient has mild left paralumbar tenderness.  No saddle anesthesia.  Skin:    Capillary Refill: Capillary refill takes less than 2 seconds.  Neurological:     General: No focal deficit present.     Mental Status: She is alert  and oriented to person, place, and time.     Comments: Patient has no saddle anesthesia.  Patient has normal reflexes bilateral legs.  Patient able to ambulate by herself.  Patient has normal strength with hip flexion bilaterally  Psychiatric:        Behavior: Behavior normal.    ED Results / Procedures / Treatments   Labs (all labs ordered are listed, but only abnormal results are displayed) Labs Reviewed - No data to display  EKG None  Radiology No results found.  Procedures Procedures   Medications Ordered in ED Medications  predniSONE (DELTASONE) tablet 60 mg (has no administration in time range)  cyclobenzaprine (FLEXERIL) tablet 5 mg (has no administration in time range)    ED Course  I have reviewed the triage vital signs and the nursing notes.  Pertinent labs & imaging results that were available during my care of the patient were reviewed by me and considered in my medical decision making (see chart for details).    MDM Rules/Calculators/A&P                           Jill Patel is a 25 y.o. female  Here with back pain.  Patient has a history of scoliosis. Has no trauma or injury. Neuro exam unremarkable. Likely muscle strain vs sciatica. Will dc home with steroids, NSAIDs, muscle relaxants   Final Clinical Impression(s) / ED Diagnoses Final diagnoses:  None    Rx / DC Orders ED Discharge Orders     None        Charlynne Pander, MD 05/28/21 1756

## 2021-05-28 NOTE — Discharge Instructions (Addendum)
You likely strain your lower back   Take motrin for pain   Take flexeril for muscle spasms   Take prednisone as prescribed   See orthopedic or spine doctor for follow up   Return to ER if you have worse back pain, numbness, weakness

## 2021-05-28 NOTE — ED Triage Notes (Signed)
Sen by Lucien Mons earlier today LWBS d/t wait Pt states the left lower part of her back  States she has scoliosis and this happens sometimes. States she needs to be checked out and get a note for work.

## 2021-05-28 NOTE — ED Triage Notes (Signed)
Pt states the left lower part of her back is swollen. States she has scoliosis and this happens sometimes. States she needs to be checked out and get a note for work.

## 2021-05-28 NOTE — ED Provider Notes (Signed)
Emergency Medicine Provider Triage Evaluation Note  Jill Patel , a 25 y.o. female  was evaluated in triage.  Pt complains of sided low back pain.  States that it started yesterday and has been constant, aching, nonradiating pain.  Denies trauma or falls.  She states that she is scoliosis and has had similar pain before previously.  She denies weakness to her lower extremities, saddle anesthesia, fevers, IV drug use.  Review of Systems  Positive: Low back pain Negative: Fever  Physical Exam  BP 121/68 (BP Location: Left Arm)   Pulse 93   Temp 99 F (37.2 C) (Oral)   Resp 17   Ht 5\' 3"  (1.6 m)   Wt 54.4 kg   LMP 05/14/2021 (Exact Date)   SpO2 99%   BMI 21.26 kg/m  Gen:   Awake, no distress   Resp:  Normal effort  MSK:   Moves extremities without difficulty.  Pulses 2+ bilateral DP. Other:  Left lumbar paraspinal scale or tenderness to palpation.  There is no midline bony tenderness.  Ambulates without difficulty.  Medical Decision Making  Medically screening exam initiated at 2:19 PM.  Appropriate orders placed.  Helayne Feighner was informed that the remainder of the evaluation will be completed by another provider, this initial triage assessment does not replace that evaluation, and the importance of remaining in the ED until their evaluation is complete.     05/16/2021, PA-C 05/28/21 1422    13/07/22, MD 05/28/21 1431

## 2021-05-29 ENCOUNTER — Telehealth: Payer: Self-pay

## 2021-05-29 NOTE — Telephone Encounter (Signed)
Transition Care Management Follow-up Telephone Call Date of discharge and from where: 05/28/2021-[facility]  How have you been since you were released from the hospital? Patient stated she is doing ok and will pick up her meds today.  Any questions or concerns? No  Items Reviewed: Did the pt receive and understand the discharge instructions provided? Yes  Medications obtained and verified? Yes  Other? No  Any new allergies since your discharge? No  Dietary orders reviewed? No Do you have support at home? Yes   Home Care and Equipment/Supplies: Were home health services ordered? not applicable If so, what is the name of the agency? N/A  Has the agency set up a time to come to the patient's home? not applicable Were any new equipment or medical supplies ordered?  No What is the name of the medical supply agency? N/A Were you able to get the supplies/equipment? not applicable Do you have any questions related to the use of the equipment or supplies? No  Functional Questionnaire: (I = Independent and D = Dependent) ADLs: I  Bathing/Dressing- I  Meal Prep- I  Eating- I  Maintaining continence- I  Transferring/Ambulation- I  Managing Meds- I  Follow up appointments reviewed:  PCP Hospital f/u appt confirmed? No   Specialist Hospital f/u appt confirmed? No   Are transportation arrangements needed? No  If their condition worsens, is the pt aware to call PCP or go to the Emergency Dept.? Yes Was the patient provided with contact information for the PCP's office or ED? Yes Was to pt encouraged to call back with questions or concerns? Yes

## 2021-07-22 NOTE — L&D Delivery Note (Signed)
Delivery Note Jill Patel is a 26 y.o. G4P0030 at [redacted]w[redacted]d admitted for IOL for decreased fetal movements.   GBS Status: Negative/-- (09/06 0950) Maximum Maternal Temperature: 98.4  Labor course: Initial SVE: closed. Augmentation with: AROM, Pitocin, and Cytotec. She then progressed to complete.  ROM: 4h 2m with clear fluid  Birth: At 61 a viable female was delivered via spontaneous vaginal delivery (Presentation: ;  ). Nuchal cord present: Yes.  Shoulders and body delivered in usual fashion. Infant placed directly on mom's abdomen for bonding/skin-to-skin, baby dried and stimulated. Cord clamped x 2 after 5  minute and cut by FOB.  Cord blood collected.  The placenta separated spontaneously and delivered via gentle cord traction.  Pitocin infused rapidly IV per protocol.  Fundus firm with massage.  Placenta inspected and appears to be intact with a 3 VC.  Placenta/Cord with the following complications: None .  Cord pH: not collected Sponge and instrument count were correct x2.  Intrapartum complications:  None Anesthesia:  epidural Episiotomy: none Lacerations:  small side-wall lacerations on maternal left that are hemostatic and well-approximated Suture Repair:  NA EBL (mL): 112   Infant: APGAR (1 MIN): 9   APGAR (5 MINS): 9   APGAR (10 MINS):    Infant weight: pending  Mom to postpartum.  Baby to Couplet care / Skin to Skin. Placenta to  L and D    Plans to Breastfeed Contraception: none Circumcision: N/A  Note sent to [facility]: [facility] for pp visit.  Mervyn Skeeters Annick Dimaio CNM, [facility] 04/16/2022 6:26 PM

## 2021-08-16 ENCOUNTER — Ambulatory Visit (INDEPENDENT_AMBULATORY_CARE_PROVIDER_SITE_OTHER): Payer: BC Managed Care – PPO

## 2021-08-16 ENCOUNTER — Other Ambulatory Visit: Payer: Self-pay

## 2021-08-16 DIAGNOSIS — N912 Amenorrhea, unspecified: Secondary | ICD-10-CM | POA: Diagnosis not present

## 2021-08-16 DIAGNOSIS — Z3481 Encounter for supervision of other normal pregnancy, first trimester: Secondary | ICD-10-CM

## 2021-08-16 DIAGNOSIS — O219 Vomiting of pregnancy, unspecified: Secondary | ICD-10-CM

## 2021-08-16 LAB — POCT URINE PREGNANCY: Preg Test, Ur: POSITIVE — AB

## 2021-08-16 MED ORDER — BLOOD PRESSURE KIT DEVI: 1.0000 | 0 refills | Status: DC

## 2021-08-16 MED ORDER — DICLEGIS 10-10 MG PO TBEC: 2.0000 | DELAYED_RELEASE_TABLET | Freq: Every day | ORAL | 5 refills | Status: DC

## 2021-08-16 NOTE — Progress Notes (Signed)
Jill Patel presents today for UPT. She has no unusual complaints. LMP: 07/15/21 EDD 04/21/22 Patient is [redacted]w[redacted]d by LMP.    OBJECTIVE: Appears well, in no apparent distress.  OB History     Gravida  1   Para      Term      Preterm      AB      Living         SAB      IAB      Ectopic      Multiple      Live Births             Home UPT Result: positive In-Office UPT result:positive  I have reviewed the patient's medical, obstetrical, social, and family histories, and medications.   ASSESSMENT: Positive pregnancy test  PLAN Prenatal care to be completed at: [facility] Patient to be scheduled for New OB intake in about 4-5 weeks.

## 2021-08-29 ENCOUNTER — Emergency Department (HOSPITAL_BASED_OUTPATIENT_CLINIC_OR_DEPARTMENT_OTHER)
Admission: EM | Admit: 2021-08-29 | Discharge: 2021-08-29 | Disposition: A | Discharge status: Home / Self Care | Payer: BC Managed Care – PPO | Attending: Emergency Medicine | Admitting: Emergency Medicine

## 2021-08-29 ENCOUNTER — Emergency Department (HOSPITAL_BASED_OUTPATIENT_CLINIC_OR_DEPARTMENT_OTHER): Payer: BC Managed Care – PPO

## 2021-08-29 ENCOUNTER — Other Ambulatory Visit: Payer: Self-pay

## 2021-08-29 ENCOUNTER — Encounter (HOSPITAL_BASED_OUTPATIENT_CLINIC_OR_DEPARTMENT_OTHER): Payer: Self-pay | Admitting: Emergency Medicine

## 2021-08-29 DIAGNOSIS — O219 Vomiting of pregnancy, unspecified: Secondary | ICD-10-CM | POA: Insufficient documentation

## 2021-08-29 DIAGNOSIS — Z3A01 Less than 8 weeks gestation of pregnancy: Secondary | ICD-10-CM | POA: Insufficient documentation

## 2021-08-29 DIAGNOSIS — R102 Pelvic and perineal pain: Secondary | ICD-10-CM

## 2021-08-29 DIAGNOSIS — Z20822 Contact with and (suspected) exposure to covid-19: Secondary | ICD-10-CM | POA: Diagnosis not present

## 2021-08-29 DIAGNOSIS — O26891 Other specified pregnancy related conditions, first trimester: Secondary | ICD-10-CM | POA: Diagnosis not present

## 2021-08-29 DIAGNOSIS — R197 Diarrhea, unspecified: Secondary | ICD-10-CM | POA: Insufficient documentation

## 2021-08-29 DIAGNOSIS — R1031 Right lower quadrant pain: Secondary | ICD-10-CM | POA: Diagnosis not present

## 2021-08-29 DIAGNOSIS — R109 Unspecified abdominal pain: Secondary | ICD-10-CM | POA: Diagnosis not present

## 2021-08-29 DIAGNOSIS — R1032 Left lower quadrant pain: Secondary | ICD-10-CM | POA: Diagnosis not present

## 2021-08-29 DIAGNOSIS — Z3491 Encounter for supervision of normal pregnancy, unspecified, first trimester: Secondary | ICD-10-CM

## 2021-08-29 DIAGNOSIS — O26899 Other specified pregnancy related conditions, unspecified trimester: Secondary | ICD-10-CM

## 2021-08-29 DIAGNOSIS — N9489 Other specified conditions associated with female genital organs and menstrual cycle: Secondary | ICD-10-CM | POA: Diagnosis not present

## 2021-08-29 DIAGNOSIS — R112 Nausea with vomiting, unspecified: Secondary | ICD-10-CM

## 2021-08-29 LAB — URINALYSIS, ROUTINE W REFLEX MICROSCOPIC
Bilirubin Urine: NEGATIVE
Glucose, UA: NEGATIVE mg/dL
Ketones, ur: NEGATIVE mg/dL
Nitrite: NEGATIVE
Protein, ur: NEGATIVE mg/dL
Specific Gravity, Urine: 1.02 (ref 1.005–1.030)
pH: 7 (ref 5.0–8.0)

## 2021-08-29 LAB — BASIC METABOLIC PANEL
Anion gap: 9 (ref 5–15)
BUN: 8 mg/dL (ref 6–20)
CO2: 24 mmol/L (ref 22–32)
Calcium: 9.8 mg/dL (ref 8.9–10.3)
Chloride: 102 mmol/L (ref 98–111)
Creatinine, Ser: 0.62 mg/dL (ref 0.44–1.00)
GFR, Estimated: >60 mL/min (ref >60)
Glucose, Bld: 83 mg/dL (ref 70–99)
Potassium: 3.5 mmol/L (ref 3.5–5.1)
Sodium: 135 mmol/L (ref 135–145)

## 2021-08-29 LAB — RESP PANEL BY RT-PCR (FLU A&B, COVID) ARPGX2
Influenza A by PCR: NEGATIVE
Influenza B by PCR: NEGATIVE
SARS Coronavirus 2 by RT PCR: NEGATIVE

## 2021-08-29 LAB — HCG, QUANTITATIVE, PREGNANCY: hCG, Beta Chain, Quant, S: 68141 m[IU]/mL — ABNORMAL HIGH (ref <5)

## 2021-08-29 LAB — CBC WITH DIFFERENTIAL/PLATELET
Abs Immature Granulocytes: 0.02 10*3/uL (ref 0.00–0.07)
Basophils Absolute: 0 10*3/uL (ref 0.0–0.1)
Basophils Relative: 0 %
Eosinophils Absolute: 0 10*3/uL (ref 0.0–0.5)
Eosinophils Relative: 0 %
HCT: 37 % (ref 36.0–46.0)
Hemoglobin: 13 g/dL (ref 12.0–15.0)
Immature Granulocytes: 0 %
Lymphocytes Relative: 23 %
Lymphs Abs: 2.2 10*3/uL (ref 0.7–4.0)
MCH: 31.5 pg (ref 26.0–34.0)
MCHC: 35.1 g/dL (ref 30.0–36.0)
MCV: 89.6 fL (ref 80.0–100.0)
Monocytes Absolute: 0.6 10*3/uL (ref 0.1–1.0)
Monocytes Relative: 7 %
Neutro Abs: 6.7 10*3/uL (ref 1.7–7.7)
Neutrophils Relative %: 70 %
Platelets: 268 10*3/uL (ref 150–400)
RBC: 4.13 MIL/uL (ref 3.87–5.11)
RDW: 11.2 % — ABNORMAL LOW (ref 11.5–15.5)
WBC: 9.6 10*3/uL (ref 4.0–10.5)
nRBC: 0 % (ref 0.0–0.2)

## 2021-08-29 LAB — URINALYSIS, MICROSCOPIC (REFLEX)

## 2021-08-29 MED ORDER — LACTATED RINGERS IV BOLUS
1000.0000 mL | Freq: Once | INTRAVENOUS | Status: AC
  Administered 2021-08-29: 1000 mL via INTRAVENOUS

## 2021-08-29 NOTE — Progress Notes (Signed)
Warm blanket provided to patient at this time. No further needs.  °

## 2021-08-29 NOTE — Discharge Instructions (Addendum)
You were seen in the ER for evaluation of your diarrhea and lower abdominal pain. Your lab work was normal. Your [place] was normal. The abdominal pain is likely muscular due to you vomiting and/or growing pains from pregnancy. Please increase the dosage on your Diclegis as prescribed to help with the nausea and vomiting. You can also try 5 small meals a day instead of 3 meals. Try a bland diet. If you have any vaginal bleeding, abnormal vaginal discharge, worsening abdominal pain, or nausea and vomiting uncontrolled by your medication, please return to the ER.

## 2021-08-29 NOTE — ED Notes (Signed)
Dc instructions reviewed with patient no questions or concerns at this time. Will follow up with obgyn

## 2021-08-29 NOTE — ED Triage Notes (Signed)
Pt [redacted] weeks pregnant woke up vomiting/diarrhea today, now complaints of lower abd and lower back pain.

## 2021-08-29 NOTE — ED Provider Notes (Signed)
[facility] Provider Note   CSN: [id number] Arrival date & time: 08/29/21  4580     History Chief Complaint  Patient presents with   Abdominal Pain   Emesis    Jill Patel is a 26 y.o. female G1P0 presents to the ED for evaluation of nausea, vomiting, diarrhea, and lower abdominal pain.  Around 0700-0800 the patient was in the bathroom and had an episode of diarrhea.  She reports that after that she got hot and vomited and has been dry heaving.  She reports she is having some diffuse lower abdominal tenderness. Last night, she at [place]. Her LMP was 07-15-2021.  She has [facility], but no ultrasound performed.  She denies any dysuria, hematuria, fevers, vaginal bleeding, vaginal discharge, chest pain, dark/tarry stools, hematemesis, hematochezia ,or shortness of breath.  She reports that she has been having nausea and vomiting throughout her pregnancy and was given Diclegis early on in addition to prenatals. She reports she occasionally takes her Diclegis at night. Medical history includes asthma and scoliosis.  Allergic to NSAIDs.  Denies any tobacco, EtOH, illicit drug use ever.   Abdominal Pain Associated symptoms: diarrhea, nausea and vomiting   Associated symptoms: no chills, no dysuria, no fever, no hematuria, no vaginal bleeding and no vaginal discharge   Emesis Associated symptoms: abdominal pain and diarrhea   Associated symptoms: no chills and no fever       Home Medications Prior to Admission medications   Medication Sig Start Date End Date Taking? Authorizing Provider  Blood Pressure Monitoring (BLOOD PRESSURE KIT) DEVI 1 kit by Does not apply route once a week. 08/16/21   Woodroe Mode, MD  DICLEGIS 10-10 MG TBEC Take 2 tablets by mouth at bedtime. If symptoms persist, add one tablet in the morning and one in the afternoon 08/16/21   Woodroe Mode, MD  Prenatal Vit-Fe Fumarate-FA (PRENATAL PO)  Take 1 tablet by mouth daily.    [provider]      Allergies    Kiwi extract, Aleve [naproxen sodium], Mango flavor, Other, and Pineapple    Review of Systems   Review of Systems  Constitutional:  Negative for chills and fever.  Gastrointestinal:  Positive for abdominal pain, diarrhea, nausea and vomiting. Negative for blood in stool.  Genitourinary:  Negative for dysuria, hematuria, vaginal bleeding, vaginal discharge and vaginal pain.   Physical Exam Updated Vital Signs BP 112/60    Pulse 71    Temp 98.5 F (36.9 C) (Oral)    Resp 18    Ht _0  (1.6 m)    Wt 56.7 kg    LMP 07/15/2021 (Exact Date)    SpO2 100%    BMI 22.14 kg/m  Physical Exam Vitals and nursing note reviewed.  Constitutional:      General: She is not in acute distress.    Appearance: Normal appearance. She is not toxic-appearing.  HENT:     Head: Normocephalic and atraumatic.  Eyes:     General: No scleral icterus. Cardiovascular:     Rate and Rhythm: Normal rate and regular rhythm.  Pulmonary:     Effort: Pulmonary effort is normal. No respiratory distress.     Breath sounds: Normal breath sounds.  Abdominal:     General: Abdomen is flat. Bowel sounds are normal. There is no distension.     Palpations: Abdomen is soft. There is no mass.     Tenderness: There  is abdominal tenderness in the right lower quadrant, suprapubic area and left lower quadrant. There is no right CVA tenderness, left CVA tenderness, guarding or rebound. Negative signs include Rovsing's sign, psoas sign and obturator sign.  Genitourinary:    Comments: Deferred given no vaginal complaints Musculoskeletal:        General: No deformity.     Cervical back: Normal range of motion.  Skin:    General: Skin is warm and dry.  Neurological:     General: No focal deficit present.     Mental Status: She is alert. Mental status is at baseline.     ED Results / Procedures / Treatments   Labs (all labs ordered are listed, but  only abnormal results are displayed) Labs Reviewed  URINALYSIS, ROUTINE W REFLEX MICROSCOPIC - Abnormal; Notable for the following components:      Result Value   Hgb urine dipstick TRACE (*)    Leukocytes,Ua SMALL (*)    All other components within normal limits  CBC WITH DIFFERENTIAL/PLATELET - Abnormal; Notable for the following components:   RDW 11.2 (*)    All other components within normal limits  URINALYSIS, MICROSCOPIC (REFLEX) - Abnormal; Notable for the following components:   Bacteria, UA RARE (*)    All other components within normal limits  BASIC METABOLIC PANEL  HCG, QUANTITATIVE, PREGNANCY    EKG None  Radiology US OB LESS THAN 14 WEEKS WITH OB TRANSVAGINAL  Result Date: 08/29/2021 CLINICAL DATA:  Abdominal pain. EXAM: OBSTETRIC <14 WK [place] AND TRANSVAGINAL OB US TECHNIQUE: Both transabdominal and transvaginal ultrasound examinations were performed for complete evaluation of the gestation as well as the maternal uterus, adnexal regions, and pelvic cul-de-sac. Transvaginal technique was performed to assess early pregnancy. COMPARISON:  None. FINDINGS: Intrauterine gestational sac: Single Yolk sac:  Not Visualized. Embryo:  Visualized. Cardiac Activity: Visualized. Heart Rate: 122 bpm CRL:  9.0 mm   6 w   6 d                  [place] EDC: April 21, 2022. Subchorionic hemorrhage:  None visualized. Maternal uterus/adnexae: Ovaries unremarkable. No free fluid is noted. IMPRESSION: Single live intrauterine gestation of 6 weeks 6 days. Electronically Signed   By: Marijo Conception M.D.   On: 08/29/2021 13:14     Procedures Procedures    Medications Ordered in ED Medications  lactated ringers bolus 1,000 mL (1,000 mLs Intravenous New Bag/Given 08/29/21 1120)    ED Course/ Medical Decision Making/ A&P                           Medical Decision Making Amount and/or Complexity of Data Reviewed Labs: ordered. Radiology: ordered.  26 year old female presents emergency department for  evaluation of nausea, vomiting, diarrhea, and lower abdominal pain since today.  Differential diagnosis includes but is not limited to, viral illness, COVID, flu, hyperemesis gravidarum, normal abdominal pain in pregnancy, ectopic, ovarian torsion, UTI, diverticulitis, colitis.  Vital signs are unremarkable.  Patient normotensive, afebrile, normal pulse rate, satting 100% on room air without any increased work of breathing.  Physical exam shows a mildly tender lower abdomen with no point tenderness.  No distention.  No overlying skin changes noted.  Her lungs are clear to auscultation bilaterally.  She has moist mucous membranes.  I think this is likely her hyperemesis gravidarum in pregnancy, but concern for lower abdominal pain.  Will order ultrasound to rule out torsion  and or ectopic pregnancy.  Labs ordered.  This time, I will suspicion for any colitis or diverticulitis as the patient only had 1 episode of not black, not bloody diarrhea. Likely due to the food she had last night as she has not had any other episodes.  I independently interpreted and reviewed the patient's labs and imaging and agree with radiologist interpretations.  BMP shows no electrolyte abnormality.  CBC shows no go cytosis or anemia.  Urinalysis shows trace blood with small amount of leukocytes but rare bacteria and 0-5 white blood cells and 0-5 red blood cells seen on microscopy.  6-10 squames visualized.  Likely dirty catch.  Not consistent with UTI patient does not have any urinary tract symptoms.  hCG [place] Brown is 68,141 which is consistent with her LMP .  COVID and flu are negative.  Ultrasound shows Single live intrauterine gestation of 6 weeks 6 days.   Patient was given 1 L LR and has not had any vomiting at this time.  Will p.o. challenge once ultrasound has populated.  PO Challenge passed. The patient reports that she is feeling better and would like to go home. At this time, I think this is reasonable. The patient has a  confirmed IUP, normal lab work, and can now tolerate foods/fluids. I recommended she increase her dose of her Diclegis to BID instead of nightly. Recommended she continue to keep in contact with her OBGYN and attend her upcoming appointment. Strict return precautions given. The patient verbalizes understanding and agrees to plan. The patient is stable and being discharged home in good condition.   Final Clinical Impression(s) / ED Diagnoses Final diagnoses:  Nausea vomiting and diarrhea  Normal intrauterine pregnancy on prenatal ultrasound in first trimester    Rx / DC Orders ED Discharge Orders     None         Sherrell Puller, PA-C 08/31/21 [place], DO 08/31/21 1534

## 2021-08-29 NOTE — ED Notes (Signed)
EDP at bedside  

## 2021-08-30 ENCOUNTER — Telehealth: Payer: Self-pay | Admitting: *Deleted

## 2021-08-30 NOTE — Telephone Encounter (Signed)
Transition Care Management Follow-up Telephone Call Date of discharge and from where: 08/29/2021 - [facility] How have you been since you were released from the hospital? "I am doing fine" Any questions or concerns? No  Items Reviewed: Did the pt receive and understand the discharge instructions provided? Yes  Medications obtained and verified?  N/A Other? No  Any new allergies since your discharge? No  Dietary orders reviewed? No Do you have support at home? Yes    Functional Questionnaire: (I = Independent and D = Dependent) ADLs: I  Bathing/Dressing- I  Meal Prep- I  Eating- I  Maintaining continence- I  Transferring/Ambulation- I  Managing Meds- I  Follow up appointments reviewed:  PCP Hospital f/u appt confirmed? No  - no PCP Specialist Hospital f/u appt confirmed? No   Are transportation arrangements needed? No  If their condition worsens, is the pt aware to call PCP or go to the Emergency Dept.? Yes Was the patient provided with contact information for the PCP's office or ED? Yes Was to pt encouraged to call back with questions or concerns? Yes

## 2021-09-13 ENCOUNTER — Other Ambulatory Visit: Payer: Self-pay

## 2021-09-13 ENCOUNTER — Emergency Department (HOSPITAL_BASED_OUTPATIENT_CLINIC_OR_DEPARTMENT_OTHER)
Admission: EM | Admit: 2021-09-13 | Discharge: 2021-09-13 | Disposition: A | Discharge status: Home / Self Care | Payer: BC Managed Care – PPO | Attending: Emergency Medicine | Admitting: Emergency Medicine

## 2021-09-13 ENCOUNTER — Encounter (HOSPITAL_BASED_OUTPATIENT_CLINIC_OR_DEPARTMENT_OTHER): Payer: Self-pay | Admitting: Emergency Medicine

## 2021-09-13 DIAGNOSIS — O99281 Endocrine, nutritional and metabolic diseases complicating pregnancy, first trimester: Secondary | ICD-10-CM | POA: Diagnosis not present

## 2021-09-13 DIAGNOSIS — Z3A01 Less than 8 weeks gestation of pregnancy: Secondary | ICD-10-CM | POA: Diagnosis not present

## 2021-09-13 DIAGNOSIS — E871 Hypo-osmolality and hyponatremia: Secondary | ICD-10-CM | POA: Diagnosis not present

## 2021-09-13 DIAGNOSIS — D72829 Elevated white blood cell count, unspecified: Secondary | ICD-10-CM | POA: Diagnosis not present

## 2021-09-13 DIAGNOSIS — O219 Vomiting of pregnancy, unspecified: Secondary | ICD-10-CM | POA: Diagnosis not present

## 2021-09-13 DIAGNOSIS — E878 Other disorders of electrolyte and fluid balance, not elsewhere classified: Secondary | ICD-10-CM | POA: Diagnosis not present

## 2021-09-13 LAB — URINALYSIS, ROUTINE W REFLEX MICROSCOPIC
Bilirubin Urine: NEGATIVE
Glucose, UA: NEGATIVE mg/dL
Hgb urine dipstick: NEGATIVE
Ketones, ur: 15 mg/dL — AB
Nitrite: NEGATIVE
Protein, ur: NEGATIVE mg/dL
Specific Gravity, Urine: 1.015 (ref 1.005–1.030)
pH: 7 (ref 5.0–8.0)

## 2021-09-13 LAB — CBC WITH DIFFERENTIAL/PLATELET
Abs Immature Granulocytes: 0.09 10*3/uL — ABNORMAL HIGH (ref 0.00–0.07)
Basophils Absolute: 0 10*3/uL (ref 0.0–0.1)
Basophils Relative: 0 %
Eosinophils Absolute: 0 10*3/uL (ref 0.0–0.5)
Eosinophils Relative: 0 %
HCT: 31.5 % — ABNORMAL LOW (ref 36.0–46.0)
Hemoglobin: 11 g/dL — ABNORMAL LOW (ref 12.0–15.0)
Immature Granulocytes: 1 %
Lymphocytes Relative: 9 %
Lymphs Abs: 1.2 10*3/uL (ref 0.7–4.0)
MCH: 31.3 pg (ref 26.0–34.0)
MCHC: 34.9 g/dL (ref 30.0–36.0)
MCV: 89.5 fL (ref 80.0–100.0)
Monocytes Absolute: 1 10*3/uL (ref 0.1–1.0)
Monocytes Relative: 7 %
Neutro Abs: 11.4 10*3/uL — ABNORMAL HIGH (ref 1.7–7.7)
Neutrophils Relative %: 83 %
Platelets: 287 10*3/uL (ref 150–400)
RBC: 3.52 MIL/uL — ABNORMAL LOW (ref 3.87–5.11)
RDW: 11.3 % — ABNORMAL LOW (ref 11.5–15.5)
WBC: 13.9 10*3/uL — ABNORMAL HIGH (ref 4.0–10.5)
nRBC: 0 % (ref 0.0–0.2)

## 2021-09-13 LAB — COMPREHENSIVE METABOLIC PANEL
ALT: 16 U/L (ref 0–44)
AST: 19 U/L (ref 15–41)
Albumin: 3.9 g/dL (ref 3.5–5.0)
Alkaline Phosphatase: 57 U/L (ref 38–126)
Anion gap: 8 (ref 5–15)
BUN: 5 mg/dL — ABNORMAL LOW (ref 6–20)
CO2: 23 mmol/L (ref 22–32)
Calcium: 9.1 mg/dL (ref 8.9–10.3)
Chloride: 102 mmol/L (ref 98–111)
Creatinine, Ser: 0.6 mg/dL (ref 0.44–1.00)
GFR, Estimated: >60 mL/min (ref >60)
Glucose, Bld: 75 mg/dL (ref 70–99)
Potassium: 3.4 mmol/L — ABNORMAL LOW (ref 3.5–5.1)
Sodium: 133 mmol/L — ABNORMAL LOW (ref 135–145)
Total Bilirubin: 0.4 mg/dL (ref 0.3–1.2)
Total Protein: 6.5 g/dL (ref 6.5–8.1)

## 2021-09-13 LAB — LIPASE, BLOOD: Lipase: 27 U/L (ref 11–51)

## 2021-09-13 LAB — URINALYSIS, MICROSCOPIC (REFLEX)

## 2021-09-13 MED ORDER — METOCLOPRAMIDE HCL 10 MG PO TABS: 10.0000 mg | ORAL_TABLET | Freq: Four times a day (QID) | ORAL | 0 refills | Status: DC

## 2021-09-13 MED ORDER — SODIUM CHLORIDE 0.9 % IV SOLN: INTRAVENOUS | Status: DC | PRN

## 2021-09-13 MED ORDER — CEPHALEXIN 500 MG PO CAPS: 500.0000 mg | ORAL_CAPSULE | Freq: Two times a day (BID) | ORAL | 0 refills | Status: DC

## 2021-09-13 MED ORDER — SODIUM CHLORIDE 0.9 % IV BOLUS
1000.0000 mL | Freq: Once | INTRAVENOUS | Status: AC
  Administered 2021-09-13: 1000 mL via INTRAVENOUS

## 2021-09-13 MED ORDER — PROCHLORPERAZINE EDISYLATE 10 MG/2ML IJ SOLN
5.0000 mg | Freq: Once | INTRAMUSCULAR | Status: AC
  Administered 2021-09-13: 5 mg via INTRAVENOUS
  Filled 2021-09-13: qty 2

## 2021-09-13 MED ORDER — METOCLOPRAMIDE HCL 5 MG/ML IJ SOLN
10.0000 mg | Freq: Once | INTRAMUSCULAR | Status: AC
  Administered 2021-09-13: 10 mg via INTRAVENOUS
  Filled 2021-09-13: qty 2

## 2021-09-13 NOTE — ED Provider Notes (Signed)
[facility] DEPART[facility] Provider Note   CSN: [id number] Arrival date & time: 09/13/21  0857     History  Chief Complaint  Patient presents with   Emesis    Jill Patel is a 26 y.o. female.  Jill Patel is a 26 y.o. female currently approximately [redacted] weeks pregnant, who presents to the emergency department for evaluation of vomiting.  Patient reports since 6 PM last night she has had persistent vomiting.  Reports no blood in the vomit.  She denies associated abdominal pain.  Did have 1 episode of nonbloody diarrhea.  Reports she has not been able to keep down anything today including the Diclegis that was previously prescribed by her OB/GYN for nausea and vomiting.  She reports she has been feeling a bit lightheaded but has not passed out.  No associated pelvic pain, no vaginal bleeding or vaginal discharge.  Patient has already had ultrasound with confirmed IUP.  The history is provided by the patient and a friend.      Home Medications Prior to Admission medications   Medication Sig Start Date End Date Taking? Authorizing Provider  metoCLOPramide (REGLAN) 10 MG tablet Take 1 tablet (10 mg total) by mouth every 6 (six) hours. Patient not taking: Reported on 09/17/2021 09/13/21  Yes Jacqlyn Larsen, PA-C  acetaminophen (TYLENOL) 500 MG tablet Take 2 tablets (1,000 mg total) by mouth every 6 (six) hours as needed for moderate pain. Patient not taking: Reported on 09/17/2021 09/14/21   Julianne Handler, CNM  Blood Pressure Monitoring (BLOOD PRESSURE KIT) DEVI 1 kit by Does not apply route once a week. 08/16/21   Woodroe Mode, MD  cyclobenzaprine (FLEXERIL) 10 MG tablet Take 1 tablet (10 mg total) by mouth 3 (three) times daily as needed for muscle spasms. 09/14/21   Julianne Handler, CNM  DICLEGIS 10-10 MG TBEC Take 2 tablets by mouth at bedtime. If symptoms persist, add one tablet in the morning and one in the afternoon 08/16/21   Woodroe Mode, MD  Prenatal  Vit-Fe Fumarate-FA (PRENATAL PO) Take 1 tablet by mouth daily.    [provider]  prochlorperazine (COMPAZINE) 10 MG tablet Take 1 tablet (10 mg total) by mouth every 6 (six) hours as needed for nausea or vomiting. Patient not taking: Reported on 09/17/2021 09/14/21   Julianne Handler, CNM      Allergies    Kiwi extract, Aleve [naproxen sodium], Mango flavor, Other, and Pineapple    Review of Systems   Review of Systems  Constitutional:  Negative for chills and fever.  HENT: Negative.    Respiratory:  Negative for cough and shortness of breath.   Cardiovascular:  Negative for chest pain.  Gastrointestinal:  Positive for diarrhea, nausea and vomiting. Negative for abdominal pain and blood in stool.  Genitourinary:  Negative for dysuria, frequency, pelvic pain, vaginal bleeding and vaginal discharge.  Musculoskeletal:  Negative for myalgias.  Neurological:  Positive for light-headedness. Negative for syncope.  All other systems reviewed and are negative.  Physical Exam Updated Vital Signs BP (!) 92/47 (BP Location: Right Arm)    Pulse 83    Temp 98.3 F (36.8 C) (Oral)    Resp 16    LMP 07/15/2021 (Exact Date)    SpO2 99%  Physical Exam Vitals and nursing note reviewed.  Constitutional:      General: She is not in acute distress.    Appearance: Normal appearance. She is well-developed. She is not diaphoretic.  HENT:  Head: Normocephalic and atraumatic.  Eyes:     General:        Right eye: No discharge.        Left eye: No discharge.     Pupils: Pupils are equal, round, and reactive to light.  Cardiovascular:     Rate and Rhythm: Normal rate and regular rhythm.     Pulses: Normal pulses.     Heart sounds: Normal heart sounds.  Pulmonary:     Effort: Pulmonary effort is normal. No respiratory distress.     Breath sounds: Normal breath sounds. No wheezing or rales.     Comments: Respirations equal and unlabored, patient able to speak in full sentences, lungs clear to  auscultation bilaterally  Abdominal:     General: Bowel sounds are normal. There is no distension.     Palpations: Abdomen is soft. There is no mass.     Tenderness: There is no abdominal tenderness. There is no guarding.     Comments: Abdomen soft, nondistended, nontender to palpation in all quadrants without guarding or peritoneal signs  Musculoskeletal:        General: No deformity.     Cervical back: Neck supple.  Skin:    General: Skin is warm and dry.     Capillary Refill: Capillary refill takes less than 2 seconds.  Neurological:     Mental Status: She is alert and oriented to person, place, and time.     Coordination: Coordination normal.     Comments: Speech is clear, able to follow commands Moves extremities without ataxia, coordination intact  Psychiatric:        Mood and Affect: Mood normal.        Behavior: Behavior normal.    ED Results / Procedures / Treatments   Labs (all labs ordered are listed, but only abnormal results are displayed) Labs Reviewed  COMPREHENSIVE METABOLIC PANEL - Abnormal; Notable for the following components:      Result Value   Sodium 133 (*)    Potassium 3.4 (*)    BUN 5 (*)    All other components within normal limits  CBC WITH DIFFERENTIAL/PLATELET - Abnormal; Notable for the following components:   WBC 13.9 (*)    RBC 3.52 (*)    Hemoglobin 11.0 (*)    HCT 31.5 (*)    RDW 11.3 (*)    Neutro Abs 11.4 (*)    Abs Immature Granulocytes 0.09 (*)    All other components within normal limits  URINALYSIS, ROUTINE W REFLEX MICROSCOPIC - Abnormal; Notable for the following components:   Color, Urine STRAW (*)    Ketones, ur 15 (*)    Leukocytes,Ua SMALL (*)    All other components within normal limits  URINALYSIS, MICROSCOPIC (REFLEX) - Abnormal; Notable for the following components:   Bacteria, UA RARE (*)    All other components within normal limits  LIPASE, BLOOD    EKG None  Radiology No results  found.  Procedures Procedures    Medications Ordered in ED Medications  sodium chloride 0.9 % bolus 1,000 mL (0 mLs Intravenous Stopped 09/13/21 1336)  metoCLOPramide (REGLAN) injection 10 mg (10 mg Intravenous Given 09/13/21 1026)  prochlorperazine (COMPAZINE) injection 5 mg (5 mg Intravenous Given 09/13/21 1336)    ED Course/ Medical Decision Making/ A&P                           .This patient presents to the ED  for concern of Nausea and vomiting, currently pregnant, this involves an extensive number of treatment options, and is a complaint that carries with it a high risk of complications and morbidity.  The differential diagnosis includes morning sickness, hyperemesis gravidarum, gastroenteritis, PUD, food borne illness, dehydration, electrolyte derangement   Co morbidities that complicate the patient evaluation  pregnancy   Additional history obtained:  Additional history obtained from friend at bedside External records from outside source obtained and reviewed including recent OB appointments, prior ultrasound   Lab Tests:  I Ordered, and personally interpreted labs.  The pertinent results include:  Mild leukocytosis, likely in the setting of repetitive vomiting, hemoglobin at baseline, very mild hyponatremia and hypochloremia likely in the setting of vomiting, normal renal function and liver function.  Normal lipase.  Urinalysis with rare bacteria present, will treat for asymptomatic bacteriuria in pregnancy   Cardiac Monitoring:  The patient was maintained on a cardiac monitor.  I personally viewed and interpreted the cardiac monitored which showed an underlying rhythm of: NSR   Medicines ordered and prescription drug management:  I ordered medication including IV fluid bolus, IV Reglan Reevaluation of the patient after these medicines showed that the patient improved, patient still having some mild nausea but no further vomiting, dose of Compazine given as well I  have reviewed the patients home medicines and have made adjustments as needed   Test Considered:  Pelvic ultrasound but given that patient has already had intrauterine pregnancy confirmed that she is not having any abdominal pain or associated vaginal bleeding do not feel this is indicated at this time.  Discussed this with patient who is in agreement.   Critical Interventions:  Antiemetics and rehydration   Problem List / ED Course:  Patient with nausea and vomiting in the setting of early pregnancy, suspect morning sickness versus gastroenteritis or foodborne illness, no abdominal tenderness.  After medications patient's symptoms have resolved and she is tolerating p.o., lab work is overall reassuring   Reevaluation:  After the interventions noted above, I reevaluated the patient and found that they have :improved     Dispostion:  After consideration of the diagnostic results and the patients response to treatment, I feel that the patent would benefit from discharge home with continued outpatient treatment, given her response she will not require admission at this time.          Final Clinical Impression(s) / ED Diagnoses Final diagnoses:  Nausea and vomiting in pregnancy    Rx / DC Orders ED Discharge Orders          Ordered    metoCLOPramide (REGLAN) 10 MG tablet  Every 6 hours        09/13/21 1417    cephALEXin (KEFLEX) 500 MG capsule  2 times daily,   Status:  Discontinued        09/13/21 1417              Janet Berlin 09/18/21 0116    Isla Pence, MD 09/18/21 0700

## 2021-09-13 NOTE — ED Triage Notes (Signed)
Emesis , non stop from 6 pm last night , diarrhea , denies abdominal pain , lightheaded .

## 2021-09-13 NOTE — Discharge Instructions (Addendum)
Your lab work today was reassuring, very mild signs of dehydration.  Your urine did show a small amount of bacteria in it, please take antibiotics as prescribed.  To help with nausea and vomiting make sure you are eating small frequent meals, use Diclegis as prescribed by your OB/GYN.  For breakthrough nausea and vomiting you can use prescribed Reglan.  Follow-up with your OB/GYN closely for continued treatment of nausea and vomiting in pregnancy.  Return for new or worsening symptoms.

## 2021-09-13 NOTE — ED Notes (Signed)
ED Provider at bedside. 

## 2021-09-14 ENCOUNTER — Encounter (HOSPITAL_COMMUNITY): Payer: Self-pay

## 2021-09-14 ENCOUNTER — Emergency Department (HOSPITAL_COMMUNITY): Payer: BC Managed Care – PPO

## 2021-09-14 ENCOUNTER — Inpatient Hospital Stay (HOSPITAL_COMMUNITY)
Admission: AD | Admit: 2021-09-14 | Discharge: 2021-09-14 | Disposition: A | Discharge status: Home / Self Care | Payer: BC Managed Care – PPO | Attending: Obstetrics and Gynecology | Admitting: Obstetrics and Gynecology

## 2021-09-14 ENCOUNTER — Other Ambulatory Visit: Payer: Self-pay

## 2021-09-14 ENCOUNTER — Telehealth: Payer: Self-pay

## 2021-09-14 DIAGNOSIS — Z3A08 8 weeks gestation of pregnancy: Secondary | ICD-10-CM | POA: Diagnosis not present

## 2021-09-14 DIAGNOSIS — Z3A01 Less than 8 weeks gestation of pregnancy: Secondary | ICD-10-CM | POA: Diagnosis not present

## 2021-09-14 DIAGNOSIS — O210 Mild hyperemesis gravidarum: Secondary | ICD-10-CM | POA: Insufficient documentation

## 2021-09-14 DIAGNOSIS — O26891 Other specified pregnancy related conditions, first trimester: Secondary | ICD-10-CM | POA: Diagnosis not present

## 2021-09-14 DIAGNOSIS — O99891 Other specified diseases and conditions complicating pregnancy: Secondary | ICD-10-CM | POA: Diagnosis not present

## 2021-09-14 DIAGNOSIS — M791 Myalgia, unspecified site: Secondary | ICD-10-CM | POA: Diagnosis not present

## 2021-09-14 DIAGNOSIS — Y92410 Unspecified street and highway as the place of occurrence of the external cause: Secondary | ICD-10-CM | POA: Diagnosis not present

## 2021-09-14 DIAGNOSIS — M7918 Myalgia, other site: Secondary | ICD-10-CM | POA: Diagnosis not present

## 2021-09-14 DIAGNOSIS — O219 Vomiting of pregnancy, unspecified: Secondary | ICD-10-CM | POA: Diagnosis not present

## 2021-09-14 LAB — WET PREP, GENITAL
Sperm: NONE SEEN
Trich, Wet Prep: NONE SEEN
WBC, Wet Prep HPF POC: <10 (ref <10)
Yeast Wet Prep HPF POC: NONE SEEN

## 2021-09-14 MED ORDER — PROCHLORPERAZINE MALEATE 10 MG PO TABS
10.0000 mg | ORAL_TABLET | Freq: Four times a day (QID) | ORAL | Status: DC | PRN
  Administered 2021-09-14: 10 mg via ORAL
  Filled 2021-09-14 (×2): qty 1

## 2021-09-14 MED ORDER — PROCHLORPERAZINE MALEATE 10 MG PO TABS: 10.0000 mg | ORAL_TABLET | Freq: Four times a day (QID) | ORAL | 0 refills | Status: DC | PRN

## 2021-09-14 MED ORDER — ACETAMINOPHEN 500 MG PO TABS: 1000.0000 mg | ORAL_TABLET | Freq: Four times a day (QID) | ORAL | 0 refills | Status: DC | PRN

## 2021-09-14 MED ORDER — CYCLOBENZAPRINE HCL 10 MG PO TABS
10.0000 mg | ORAL_TABLET | Freq: Three times a day (TID) | ORAL | 0 refills | Status: DC | PRN
Start: 2021-09-14 — End: 2022-02-11

## 2021-09-14 MED ORDER — ALUM & MAG HYDROXIDE-SIMETH 200-200-20 MG/5ML PO SUSP
30.0000 mL | Freq: Once | ORAL | Status: AC
  Administered 2021-09-14: 30 mL via ORAL
  Filled 2021-09-14: qty 30

## 2021-09-14 MED ORDER — CYCLOBENZAPRINE HCL 5 MG PO TABS
10.0000 mg | ORAL_TABLET | Freq: Three times a day (TID) | ORAL | Status: DC | PRN
  Administered 2021-09-14: 10 mg via ORAL
  Filled 2021-09-14: qty 2

## 2021-09-14 MED ORDER — ACETAMINOPHEN 500 MG PO TABS
1000.0000 mg | ORAL_TABLET | Freq: Four times a day (QID) | ORAL | Status: DC | PRN
  Administered 2021-09-14: 1000 mg via ORAL
  Filled 2021-09-14: qty 2

## 2021-09-14 NOTE — Telephone Encounter (Signed)
Transition Care Management Follow-up Telephone Call Date of discharge and from where: 09/13/2021 from [facility] How have you been since you were released from the hospital? Patient stated that she is feeling some better. Patient did not have any questions or concerns at this time.  Any questions or concerns? No  Items Reviewed: Did the pt receive and understand the discharge instructions provided? Yes  Medications obtained and verified? Yes  Other? No  Any new allergies since your discharge? No  Dietary orders reviewed? No Do you have support at home? Yes   Functional Questionnaire: (I = Independent and D = Dependent) ADLs: I  Bathing/Dressing- I  Meal Prep- I  Eating- I  Maintaining continence- I  Transferring/Ambulation- I  Managing Meds- I   Follow up appointments reviewed:  PCP Hospital f/u appt confirmed? No   Specialist Hospital f/u appt confirmed? Yes  Scheduled to see Sharen Counter, CNM on 10/02/2021 @ 03:10pm. Are transportation arrangements needed? No  If their condition worsens, is the pt aware to call PCP or go to the Emergency Dept.? Yes Was the patient provided with contact information for the PCP's office or ED? Yes Was to pt encouraged to call back with questions or concerns? Yes
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
## 2021-09-16 LAB — CULTURE, OB URINE: Culture: <10000 — AB

## 2021-09-17 ENCOUNTER — Ambulatory Visit (INDEPENDENT_AMBULATORY_CARE_PROVIDER_SITE_OTHER): Payer: BC Managed Care – PPO

## 2021-09-17 VITALS — BP 105/53 | HR 88 | Temp 97.8°F | Wt 125.0 lb

## 2021-09-17 DIAGNOSIS — Z1331 Encounter for screening for depression: Secondary | ICD-10-CM

## 2021-09-17 DIAGNOSIS — Z3A00 Weeks of gestation of pregnancy not specified: Secondary | ICD-10-CM

## 2021-09-17 DIAGNOSIS — Z3480 Encounter for supervision of other normal pregnancy, unspecified trimester: Secondary | ICD-10-CM | POA: Insufficient documentation

## 2021-09-17 DIAGNOSIS — Z3481 Encounter for supervision of other normal pregnancy, first trimester: Secondary | ICD-10-CM

## 2021-09-17 LAB — GC/CHLAMYDIA PROBE AMP (~~LOC~~) NOT AT ARMC
Chlamydia: POSITIVE — AB
Comment: NEGATIVE
Comment: NORMAL
Neisseria Gonorrhea: NEGATIVE
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
## 2021-09-17 NOTE — Progress Notes (Signed)
Patient was assessed and managed by nursing staff during this encounter. I have reviewed the chart and agree with the documentation and plan. I have also made any necessary editorial changes. ° °Beau Vanduzer, MD °09/17/2021 12:32 PM  ° °

## 2021-09-18 ENCOUNTER — Telehealth (HOSPITAL_COMMUNITY): Payer: Self-pay

## 2021-09-18 MED ORDER — AZITHROMYCIN 500 MG PO TABS: 1000.0000 mg | ORAL_TABLET | Freq: Once | ORAL | 0 refills | Status: AC

## 2021-09-19 DIAGNOSIS — A749 Chlamydial infection, unspecified: Secondary | ICD-10-CM | POA: Insufficient documentation

## 2021-10-02 ENCOUNTER — Encounter: Payer: BC Managed Care – PPO | Admitting: Advanced Practice Midwife

## 2021-10-02 ENCOUNTER — Other Ambulatory Visit: Payer: Self-pay | Admitting: Advanced Practice Midwife

## 2021-10-02 ENCOUNTER — Ambulatory Visit (INDEPENDENT_AMBULATORY_CARE_PROVIDER_SITE_OTHER): Payer: BC Managed Care – PPO | Admitting: Licensed Clinical Social Worker

## 2021-10-02 ENCOUNTER — Other Ambulatory Visit: Payer: Self-pay

## 2021-10-02 DIAGNOSIS — Z1331 Encounter for screening for depression: Secondary | ICD-10-CM

## 2021-10-02 DIAGNOSIS — F32A Depression, unspecified: Secondary | ICD-10-CM

## 2021-10-02 DIAGNOSIS — O99340 Other mental disorders complicating pregnancy, unspecified trimester: Secondary | ICD-10-CM

## 2021-10-03 ENCOUNTER — Other Ambulatory Visit (HOSPITAL_COMMUNITY)
Admission: RE | Admit: 2021-10-03 | Discharge: 2021-10-03 | Disposition: A | Discharge status: Home / Self Care | Payer: BC Managed Care – PPO | Source: Ambulatory Visit

## 2021-10-03 ENCOUNTER — Ambulatory Visit (INDEPENDENT_AMBULATORY_CARE_PROVIDER_SITE_OTHER): Payer: BC Managed Care – PPO

## 2021-10-03 VITALS — BP 110/52 | HR 66 | Wt 112.0 lb

## 2021-10-03 DIAGNOSIS — Z3480 Encounter for supervision of other normal pregnancy, unspecified trimester: Secondary | ICD-10-CM | POA: Diagnosis not present

## 2021-10-03 DIAGNOSIS — O98811 Other maternal infectious and parasitic diseases complicating pregnancy, first trimester: Secondary | ICD-10-CM | POA: Insufficient documentation

## 2021-10-03 DIAGNOSIS — A749 Chlamydial infection, unspecified: Secondary | ICD-10-CM

## 2021-10-03 DIAGNOSIS — Z62810 Personal history of physical and sexual abuse in childhood: Secondary | ICD-10-CM

## 2021-10-03 DIAGNOSIS — Z3490 Encounter for supervision of normal pregnancy, unspecified, unspecified trimester: Secondary | ICD-10-CM | POA: Diagnosis present

## 2021-10-03 DIAGNOSIS — Z3A11 11 weeks gestation of pregnancy: Secondary | ICD-10-CM | POA: Diagnosis not present

## 2021-10-03 NOTE — Progress Notes (Signed)
NEW OB, reports no problems today. 
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
## 2021-10-03 NOTE — BH Specialist Note (Signed)
Integrated Behavioral Health Initial In-Person Visit ? ?MRN: [id number] ?Name: Margrette Wynia ? ?Number of Integrated Behavioral Health Clinician visits: 1 ?Session Start time:   3:35pm ?Session End time: 4:06pm ?Total time in minutes: 31 mins in person at [facility]  ? ?Types of Service: Individual psychotherapy ? ?Interpretor:No. Interpretor Name and Language: none ? ? Warm Hand Off Completed. ?  ? ?  ? ? ?Subjective: ?Ellary Casamento is a 26 y.o. female accompanied by Partner/Significant Other ?Patient was referred by Chana Bode MD  for positive depressive screen. ?Patient reports the following symptoms/concerns: depressed mood, social isolation, limited social support, worry, crying and negative self image  ?Duration of problem: over one year; Severity of problem: mild ? ?Objective: ?Mood: Depressed and Affect: Depressed ?Risk of harm to self or others: No plan to harm self or others ? ?Life Context: ?Family and Social: Partner is supportive  ?School/Work: n/a ?Self-Care: n/a ?Life Changes: New pregnancy ? ?Patient and/or Family's Strengths/Protective Factors: ?Concrete supports in place (healthy food, safe environments, etc.) ? ?Goals Addressed: ?Patient will: ?Reduce symptoms of: depression ?Increase knowledge and/or ability of: coping skills  ?Demonstrate ability to: Increase healthy adjustment to current life circumstances and Increase adequate support systems for patient/family ? ?Progress towards Goals: ?Ongoing ? ?Interventions: ?Interventions utilized: Supportive Counseling  ?Standardized Assessments completed: PHQ 9 ? ?Patient and/or Family Response: Ms. Mentzer responded well to visit ? ? ?Assessment: ?Patient currently experiencing depression affecting pregnancy. ?  ?Patient may benefit from integrated behavioral health. ? ?Plan: ?Follow up with behavioral health clinician on : 10/31/2021 ?Behavioral recommendations: Communicate needs with partner for added support, prioritize rest, begin journal  writing to identify emotional triggers, engage in self care  ?Referral(s): Integrated [employer] (In Clinic) ? ? ?Gwyndolyn Saxon, LCSW ? ? ? ? ? ? ? ? ?

## 2021-10-04 LAB — CBC/D/PLT+RPR+RH+ABO+RUBIGG...
Antibody Screen: NEGATIVE
Basophils Absolute: 0 10*3/uL (ref 0.0–0.2)
Basos: 0 %
EOS (ABSOLUTE): 0 10*3/uL (ref 0.0–0.4)
Eos: 0 %
HCV Ab: NONREACTIVE
HIV Screen 4th Generation wRfx: NONREACTIVE
Hematocrit: 29.4 % — ABNORMAL LOW (ref 34.0–46.6)
Hemoglobin: 10.1 g/dL — ABNORMAL LOW (ref 11.1–15.9)
Hepatitis B Surface Ag: NEGATIVE
Immature Grans (Abs): 0 10*3/uL (ref 0.0–0.1)
Immature Granulocytes: 0 %
Lymphocytes Absolute: 2 10*3/uL (ref 0.7–3.1)
Lymphs: 28 %
MCH: 30.9 pg (ref 26.6–33.0)
MCHC: 34.4 g/dL (ref 31.5–35.7)
MCV: 90 fL (ref 79–97)
Monocytes Absolute: 0.4 10*3/uL (ref 0.1–0.9)
Monocytes: 6 %
Neutrophils Absolute: 4.7 10*3/uL (ref 1.4–7.0)
Neutrophils: 66 %
Platelets: 270 10*3/uL (ref 150–450)
RBC: 3.27 x10E6/uL — ABNORMAL LOW (ref 3.77–5.28)
RDW: 11.5 % — ABNORMAL LOW (ref 11.7–15.4)
RPR Ser Ql: NONREACTIVE
Rh Factor: POSITIVE
Rubella Antibodies, IGG: 1.41 index (ref >0.99)
WBC: 7.2 10*3/uL (ref 3.4–10.8)

## 2021-10-04 LAB — HEMOGLOBIN A1C
Est. average glucose Bld gHb Est-mCnc: 97 mg/dL
Hgb A1c MFr Bld: 5 % (ref 4.8–5.6)

## 2021-10-04 LAB — CERVICOVAGINAL ANCILLARY ONLY
Chlamydia: NEGATIVE
Comment: NEGATIVE
Comment: NEGATIVE
Comment: NORMAL
Neisseria Gonorrhea: NEGATIVE
Trichomonas: NEGATIVE

## 2021-10-04 LAB — HCV INTERPRETATION

## 2021-10-05 ENCOUNTER — Other Ambulatory Visit: Payer: Self-pay | Admitting: *Deleted

## 2021-10-05 LAB — CYTOLOGY - PAP
Comment: NEGATIVE
Diagnosis: NEGATIVE
Diagnosis: REACTIVE
High risk HPV: NEGATIVE

## 2021-10-05 MED ORDER — IRON (FERROUS SULFATE) 325 (65 FE) MG PO TABS: 1.0000 | ORAL_TABLET | ORAL | 3 refills | Status: DC

## 2021-10-05 NOTE — Progress Notes (Signed)
Iron supplement ordered today per provider request. ?See lab result ?

## 2021-10-18 ENCOUNTER — Telehealth: Payer: Self-pay

## 2021-10-18 ENCOUNTER — Other Ambulatory Visit: Payer: Self-pay

## 2021-10-18 DIAGNOSIS — D563 Thalassemia minor: Secondary | ICD-10-CM

## 2021-10-18 NOTE — Telephone Encounter (Signed)
Patient made aware of [facility] results and that she will be referred to Genetic Counseling for further evaluation. Referral to MFM placed. Patient informed that she will be contacted regarding appt date and time. Patient verbalized understanding ?

## 2021-10-18 NOTE — Telephone Encounter (Signed)
Attempted to call patient to inform of [employer] results. Patient is a Silent Carrier for Alpha- Thalassemia. ? ?No answer. Unable to leave vm due to vm box not being set up. ?

## 2021-10-24 ENCOUNTER — Other Ambulatory Visit: Payer: Self-pay | Admitting: *Deleted

## 2021-10-24 DIAGNOSIS — Z3480 Encounter for supervision of other normal pregnancy, unspecified trimester: Secondary | ICD-10-CM

## 2021-10-31 ENCOUNTER — Ambulatory Visit (INDEPENDENT_AMBULATORY_CARE_PROVIDER_SITE_OTHER): Payer: BC Managed Care – PPO | Admitting: Licensed Clinical Social Worker

## 2021-10-31 ENCOUNTER — Ambulatory Visit (INDEPENDENT_AMBULATORY_CARE_PROVIDER_SITE_OTHER): Payer: BC Managed Care – PPO | Admitting: Certified Nurse Midwife

## 2021-10-31 VITALS — BP 106/61 | HR 71 | Wt 117.2 lb

## 2021-10-31 DIAGNOSIS — F32A Depression, unspecified: Secondary | ICD-10-CM

## 2021-10-31 DIAGNOSIS — O99340 Other mental disorders complicating pregnancy, unspecified trimester: Secondary | ICD-10-CM

## 2021-10-31 DIAGNOSIS — D563 Thalassemia minor: Secondary | ICD-10-CM

## 2021-10-31 DIAGNOSIS — Z3402 Encounter for supervision of normal first pregnancy, second trimester: Secondary | ICD-10-CM

## 2021-10-31 DIAGNOSIS — Z3A15 15 weeks gestation of pregnancy: Secondary | ICD-10-CM
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
## 2021-11-01 NOTE — BH Specialist Note (Signed)
Integrated Behavioral Health Follow Up In-Person Visit ? ?MRN: [id number] ?Name: Jill Patel ? ?Number of [facility] Clinician visits: 2 ?Session Start time: 0915 ?  ?Session End time: 0950 ? ?Total time in minutes: 35 ?In person at [facility]  ? ?Types of Service: Individual psychotherapy ? ?Interpretor:No. Interpretor Name and Language: none ? ?Subjective: ?[name] is a 26 y.o. female accompanied by Partner/Significant Other ?Patient was referred by Rosendo Gros MD for positive depression screen. ?Patient reports the following symptoms/concerns: low mood, unstable housing, feeling on edge  ?Duration of problem: over one year ; Severity of problem: mild ? ?Objective: ?Mood: Depressed and Affect: Depressed ?Risk of harm to self or others: No plan to harm self or others ? ?Life Context: ?Family and Social: Lives with friend in student housing  ?School/Work: n/a ?Self-Care: n/a ?Life Changes: New pregnancy ? ?Patient and/or Family's Strengths/Protective Factors: ?Sense of purpose ? ?Goals Addressed: ?Patient will: ? Reduce symptoms of: depression  ? Increase knowledge and/or ability of: coping skills and self-management skills  ? Demonstrate ability to: Increase healthy adjustment to current life circumstances ? ?Progress towards Goals: ?Ongoing ? ?Interventions: ?Interventions utilized:  Supportive Counseling ?Standardized Assessments completed: PHQ 9 ? ?Patient and/or Family Response: Ms Crump responded well to visit  ? ?Assessment: ?Patient currently experiencing depression.  ? ?Patient may benefit from integrated behavioral health. ? ?Plan: ?Follow up with behavioral health clinician on :  11/28/2021 ?Behavioral recommendations:  Communicate needs with partner for added support, prioritize rest, begin journal writing to identify emotional triggers, engage in self care  ?Referral(s): [facility] (In Clinic) ?"From scale of 1-10, how likely are you to follow  plan?":   ? ?Lynnea Ferrier, LCSW ? ? ?

## 2021-11-02 LAB — AFP, SERUM, OPEN SPINA BIFIDA
AFP MoM: 0.93
AFP Value: 38 ng/mL
Gest. Age on Collection Date: 15.3 weeks
Maternal Age At EDD: 23.9 yr
OSBR Risk 1 IN: 10000
Test Results:: NEGATIVE
Weight: 117 [lb_av]

## 2021-11-27 ENCOUNTER — Ambulatory Visit: Payer: BC Managed Care – PPO | Admitting: *Deleted

## 2021-11-27 ENCOUNTER — Other Ambulatory Visit: Payer: Self-pay | Admitting: Genetics

## 2021-11-27 ENCOUNTER — Encounter: Payer: Self-pay | Admitting: *Deleted

## 2021-11-27 ENCOUNTER — Ambulatory Visit: Payer: BC Managed Care – PPO

## 2021-11-27 ENCOUNTER — Ambulatory Visit (HOSPITAL_BASED_OUTPATIENT_CLINIC_OR_DEPARTMENT_OTHER): Payer: BC Managed Care – PPO

## 2021-11-27 ENCOUNTER — Other Ambulatory Visit: Payer: Self-pay | Admitting: *Deleted

## 2021-11-27 ENCOUNTER — Ambulatory Visit (HOSPITAL_BASED_OUTPATIENT_CLINIC_OR_DEPARTMENT_OTHER): Payer: BC Managed Care – PPO | Admitting: Obstetrics

## 2021-11-27 VITALS — BP 105/64 | HR 75

## 2021-11-27 DIAGNOSIS — Z148 Genetic carrier of other disease: Secondary | ICD-10-CM | POA: Diagnosis not present

## 2021-11-27 DIAGNOSIS — O35BXX0 Maternal care for other (suspected) fetal abnormality and damage, fetal cardiac anomalies, not applicable or unspecified: Secondary | ICD-10-CM | POA: Diagnosis not present

## 2021-11-27 DIAGNOSIS — Z3480 Encounter for supervision of other normal pregnancy, unspecified trimester: Secondary | ICD-10-CM | POA: Insufficient documentation

## 2021-11-27 DIAGNOSIS — D563 Thalassemia minor: Secondary | ICD-10-CM

## 2021-11-27 DIAGNOSIS — O358XX0 Maternal care for other (suspected) fetal abnormality and damage, not applicable or unspecified: Secondary | ICD-10-CM | POA: Diagnosis not present

## 2021-11-27 DIAGNOSIS — Z3A19 19 weeks gestation of pregnancy: Secondary | ICD-10-CM

## 2021-11-27 DIAGNOSIS — Z363 Encounter for antenatal screening for malformations: Secondary | ICD-10-CM

## 2021-11-27 DIAGNOSIS — Z362 Encounter for other antenatal screening follow-up: Secondary | ICD-10-CM

## 2021-11-27 NOTE — Progress Notes (Signed)
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   ?EDC: 04/21/2022 LMP: 07/15/2021 Referring Provider:  ?Brand Males, CNM  ?EGA: 107w2d Genetic Counselor: ?Teena Dunk, MS, CGC  ?OB Hx: G4P0030 Date of Appointment: 11/27/2021  ?Accompanied by: Father of the current pregnancy (Jill Patel) Face to Face Time: 40 Minutes  ? ?Previous Testing Completed: ?Jill Patel previously completed Non-Invasive Prenatal Screening (NIPS) in this pregnancy. The result is low risk, consistent with a female fetus. This screening significantly reduces the risk that the current pregnancy has Down syndrome, Trisomy 57, Trisomy 5, and common sex chromosome conditions, however, the risk is not zero given the limitations of NIPS. Additionally, there are many genetic conditions that cannot be detected by NIPS.  ?Jill Patel previously completed carrier screening. She screened to be a silent carrier for alpha thalassemia. She screened to not be a carrier for Cystic Fibrosis (CF), Spinal Muscular Atrophy (SMA), and beta hemoglobinopathies. A negative result on carrier screening reduces the likelihood of being a carrier, however, does not entirely rule out the possibility. ?Jill Patel previously completed a maternal serum AFP screen in this pregnancy. The result is screen negative. A negative result reduces the risk that the current pregnancy has an open neural tube defect. Closed neural tube defects and some open defects may not be detected by this screen.   ? ?Medical History:  ?This is Jill Patel's 4th pregnancy. She has had 3 early losses, 2 of which were elective. ?Reports she takes prenatal vitamins. ?Reports a chlamydia infection which was treated.  ?Denies personal history of diabetes, high blood pressure, thyroid conditions, and seizures. ?Denies bleeding and fevers in this pregnancy. ?Denies using tobacco, alcohol, or street drugs in this pregnancy.  ? ?Family History: A  pedigree was created and scanned into Epic under the Media tab. ?Jill Patel reports her father had bilateral polydactyly on his hands.  ?Maternal ethnicity reported as African American and paternal ethnicity reported as African American. ?Denies Ashkenazi Jewish ancestry. ?Family history not remarkable for consanguinity, intellectual disability, autism spectrum disorder, multiple spontaneous abortions, still births, or unexplained neonatal death.  ?   ?Genetic Counseling:  ? ?Silent Carrier for Alpha Thalassemia. Jill Patel is a silent carrier for alpha thalassemia (??/?-). Positive for the pathogenic alpha 3.7 deletion of the HBA2 gene. With Jill Patel's alpha thalassemia screening result, we know that she has three working copies of the alpha-globin genes while the 4th alpha-globin gene is deleted. Each of Jill Patel's children will either inherit two functional copies or one functional copy with one deletion from her. Jill Patel is not at an increased risk to have a baby with fetal hydrops due to Hemoglobin Barts disease (--/--) regardless of Jill's carrier status. We discussed there would be a 25% risk for the current pregnancy to be affected with Hemoglobin H disease (--/?-) if Jill is found to be an alpha thalassemia carrier in the cis configuration (??/--). Clinical features of Hemoglobin H disease are highly variable and generally develop in the first years of life. The primary symptoms include moderate anemia with marked microcytosis, jaundice, and hepatosplenomegaly. Some affected individuals do not require blood transfusions while others may require occasional blood transfusions throughout their lifetime. Because Jill Patel is a silent carrier for alpha thalassemia (??/?-), carrier screening for Jill was offered to determine risk for the current pregnancy. We reviewed with the couple that if Jill is found to be a carrier for alpha thalassemia, given his African American ancestry, it is more likely  for him to be  a silent carrier (??/?-) or a carrier in the trans configuration (?-/?-) as the cis configuration (??/--) has been reported very rarely in individuals with African American ancestry. If Jill is a non-carrier (??/??), a silent carrier (??/?-), or a carrier in the trans configuration (?-/?-) there would not be an increased risk for the pregnancy to have Hemoglobin H disease.  ? ?Family history of Polydactyly. Genetic counseling reviewed with the couple that polydactyly can be seen in isolation or alongside other congenital birth defects. When seen in isolation, polydactyly is typically inherited in an autosomal dominant manner with incomplete penetrance. In general, all babies have approximately a 3-5% risk for a birth defect. Ultrasound may detect some birth defects, but it may not detect all birth defects. A normal ultrasound does not guarantee a healthy pregnancy. ?  ? ?Testing/Screening Options:  ? ?Carrier Screening. Per the ACOG Committee Opinion 691, if an individual is found to be a carrier for a specific condition, the individual's reproductive partner should be offered testing in order to receive informed genetic counseling about potential reproductive outcomes. Genetic counseling offered Jill carrier screening for alpha thalassemia given Jill Patel's carrier status. ?  ?  ?Patient Plan: ? ?Proceed with: Routine prenatal care ?Informed consent was obtained. All questions were answered. ? ?Declined: Carrier Screening for Jill for alpha thalassemia  ? ?Thank you for sharing in the care of Jill Patel with [place].  ?Please do not hesitate to contact us if you have any questions. ? ?Teena Dunk, MS, CGC ?Certified Genetic Counselor ?

## 2021-11-27 NOTE — Progress Notes (Signed)
MFM Note ? ?Jill Patel was seen for a detailed fetal anatomy scan.  She has screened positive as a silent carrier for alpha thalassemia. ? ?She denies any significant past medical history and denies any problems in her current pregnancy.  ?  ?She had a cell free DNA test earlier in her pregnancy which indicated a low risk for trisomy 62, 19, and 13. A female fetus is predicted.  ? ?She was informed that the fetal growth and amniotic fluid level were appropriate for her gestational age.  ? ?On today's exam, an intracardiac echogenic focus was noted in the left ventricle of the fetal heart.   ? ?The small association between an echogenic focus and Down syndrome was discussed. Due to the echogenic focus noted today, the patient was offered and declined an amniocentesis today for definitive diagnosis of fetal aneuploidy.  She reports that she is comfortable with her negative cell free DNA test. ? ?The patient was informed that anomalies may be missed due to technical limitations. If the fetus is in a suboptimal position or maternal habitus is increased, visualization of the fetus in the maternal uterus may be impaired. ? ?The patient is meeting with our genetic counselor today to discuss the significance of being a silent carrier for alpha thalassemia. ? ?A follow-up exam was scheduled in 4 weeks to complete the views of the fetal anatomy. ? ?The patient stated that all of her questions had been answered today. ? ?A total of 30 minutes was spent counseling and coordinating the care for this patient.  Greater than 50% of the time was spent in direct face-to-face contact. ?

## 2021-11-28 ENCOUNTER — Ambulatory Visit (INDEPENDENT_AMBULATORY_CARE_PROVIDER_SITE_OTHER): Payer: BC Managed Care – PPO | Admitting: Student

## 2021-11-28 VITALS — BP 109/59 | HR 81 | Wt 120.2 lb

## 2021-11-28 DIAGNOSIS — Z3402 Encounter for supervision of normal first pregnancy, second trimester: Secondary | ICD-10-CM

## 2021-11-28 DIAGNOSIS — Z3A19 19 weeks gestation of pregnancy: Secondary | ICD-10-CM

## 2021-11-28 NOTE — Progress Notes (Signed)
Pt presents for ROB. No unusual complaints. 
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
## 2021-12-26 ENCOUNTER — Encounter: Payer: Self-pay | Admitting: Medical

## 2021-12-26 ENCOUNTER — Ambulatory Visit (INDEPENDENT_AMBULATORY_CARE_PROVIDER_SITE_OTHER): Payer: BC Managed Care – PPO | Admitting: Medical

## 2021-12-26 VITALS — BP 98/60 | HR 70 | Wt 122.2 lb

## 2021-12-26 DIAGNOSIS — O283 Abnormal ultrasonic finding on antenatal screening of mother: Secondary | ICD-10-CM | POA: Insufficient documentation

## 2021-12-26 DIAGNOSIS — D563 Thalassemia minor: Secondary | ICD-10-CM

## 2021-12-26 DIAGNOSIS — A749 Chlamydial infection, unspecified: Secondary | ICD-10-CM

## 2021-12-26 DIAGNOSIS — Z3A23 23 weeks gestation of pregnancy: Secondary | ICD-10-CM

## 2021-12-26 DIAGNOSIS — O98812 Other maternal infectious and parasitic diseases complicating pregnancy, second trimester: Secondary | ICD-10-CM

## 2021-12-26 DIAGNOSIS — Z3480 Encounter for supervision of other normal pregnancy, unspecified trimester: Secondary | ICD-10-CM

## 2021-12-26 MED ORDER — PRENATAL 27-0.8 MG PO TABS: 1.0000 | ORAL_TABLET | Freq: Every day | ORAL | 6 refills | Status: DC

## 2021-12-26 NOTE — Progress Notes (Signed)
ROB 23.[redacted] wks GA Needs RX PNV with Iron per pt, does not want to take PNV and FE separately.
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
## 2021-12-27 ENCOUNTER — Ambulatory Visit: Payer: BC Managed Care – PPO | Admitting: *Deleted

## 2021-12-27 ENCOUNTER — Ambulatory Visit: Payer: BC Managed Care – PPO | Attending: Obstetrics

## 2021-12-27 VITALS — BP 106/53 | HR 68

## 2021-12-27 DIAGNOSIS — O285 Abnormal chromosomal and genetic finding on antenatal screening of mother: Secondary | ICD-10-CM | POA: Diagnosis not present

## 2021-12-27 DIAGNOSIS — O283 Abnormal ultrasonic finding on antenatal screening of mother: Secondary | ICD-10-CM

## 2021-12-27 DIAGNOSIS — D563 Thalassemia minor: Secondary | ICD-10-CM | POA: Diagnosis not present

## 2021-12-27 DIAGNOSIS — A749 Chlamydial infection, unspecified: Secondary | ICD-10-CM | POA: Diagnosis present

## 2021-12-27 DIAGNOSIS — O98812 Other maternal infectious and parasitic diseases complicating pregnancy, second trimester: Secondary | ICD-10-CM | POA: Insufficient documentation

## 2021-12-27 DIAGNOSIS — O358XX1 Maternal care for other (suspected) fetal abnormality and damage, fetus 1: Secondary | ICD-10-CM

## 2021-12-27 DIAGNOSIS — Z3A23 23 weeks gestation of pregnancy: Secondary | ICD-10-CM | POA: Diagnosis not present

## 2021-12-27 DIAGNOSIS — Z362 Encounter for other antenatal screening follow-up: Secondary | ICD-10-CM | POA: Insufficient documentation

## 2021-12-27 DIAGNOSIS — O358XX0 Maternal care for other (suspected) fetal abnormality and damage, not applicable or unspecified: Secondary | ICD-10-CM

## 2022-01-02 ENCOUNTER — Inpatient Hospital Stay (HOSPITAL_COMMUNITY)
Admission: AD | Admit: 2022-01-02 | Discharge: 2022-01-02 | Disposition: A | Discharge status: Home / Self Care | Payer: BC Managed Care – PPO | Attending: Obstetrics and Gynecology | Admitting: Obstetrics and Gynecology

## 2022-01-02 ENCOUNTER — Encounter (HOSPITAL_COMMUNITY): Payer: Self-pay | Admitting: Obstetrics and Gynecology

## 2022-01-02 DIAGNOSIS — O283 Abnormal ultrasonic finding on antenatal screening of mother: Secondary | ICD-10-CM | POA: Diagnosis not present

## 2022-01-02 DIAGNOSIS — J029 Acute pharyngitis, unspecified: Secondary | ICD-10-CM | POA: Diagnosis present

## 2022-01-02 DIAGNOSIS — Z3A24 24 weeks gestation of pregnancy: Secondary | ICD-10-CM | POA: Diagnosis not present

## 2022-01-02 DIAGNOSIS — Z20822 Contact with and (suspected) exposure to covid-19: Secondary | ICD-10-CM | POA: Insufficient documentation

## 2022-01-02 DIAGNOSIS — R519 Headache, unspecified: Secondary | ICD-10-CM | POA: Insufficient documentation

## 2022-01-02 DIAGNOSIS — Z886 Allergy status to analgesic agent status: Secondary | ICD-10-CM | POA: Insufficient documentation

## 2022-01-02 DIAGNOSIS — Z87891 Personal history of nicotine dependence: Secondary | ICD-10-CM | POA: Insufficient documentation

## 2022-01-02 DIAGNOSIS — J069 Acute upper respiratory infection, unspecified: Secondary | ICD-10-CM | POA: Diagnosis not present

## 2022-01-02 DIAGNOSIS — G44209 Tension-type headache, unspecified, not intractable: Secondary | ICD-10-CM | POA: Diagnosis not present

## 2022-01-02 DIAGNOSIS — O99512 Diseases of the respiratory system complicating pregnancy, second trimester: Secondary | ICD-10-CM | POA: Diagnosis not present

## 2022-01-02 DIAGNOSIS — O26892 Other specified pregnancy related conditions, second trimester: Secondary | ICD-10-CM | POA: Insufficient documentation

## 2022-01-02 HISTORY — DX: Anemia, unspecified: D64.9

## 2022-01-02 HISTORY — DX: Depression, unspecified: F32.A

## 2022-01-02 HISTORY — DX: Anxiety disorder, unspecified: F41.9

## 2022-01-02 HISTORY — DX: Cardiac murmur, unspecified: R01.1

## 2022-01-02 HISTORY — DX: Headache, unspecified: R51.9

## 2022-01-02 LAB — RESP PANEL BY RT-PCR (FLU A&B, COVID) ARPGX2
Influenza A by PCR: NEGATIVE
Influenza B by PCR: NEGATIVE
SARS Coronavirus 2 by RT PCR: NEGATIVE

## 2022-01-02 LAB — URINALYSIS, ROUTINE W REFLEX MICROSCOPIC
Bilirubin Urine: NEGATIVE
Glucose, UA: NEGATIVE mg/dL
Ketones, ur: NEGATIVE mg/dL
Nitrite: NEGATIVE
Protein, ur: NEGATIVE mg/dL
RBC / HPF: >50 RBC/hpf — ABNORMAL HIGH (ref 0–5)
Specific Gravity, Urine: 1.013 (ref 1.005–1.030)
pH: 7 (ref 5.0–8.0)

## 2022-01-02 MED ORDER — METOCLOPRAMIDE HCL 10 MG PO TABS
10.0000 mg | ORAL_TABLET | Freq: Once | ORAL | Status: AC
  Administered 2022-01-02: 10 mg via ORAL
  Filled 2022-01-02: qty 1

## 2022-01-02 MED ORDER — ONDANSETRON HCL 4 MG PO TABS: 4.0000 mg | ORAL_TABLET | Freq: Once | ORAL | Status: DC

## 2022-01-02 MED ORDER — DIPHENHYDRAMINE HCL 25 MG PO CAPS
25.0000 mg | ORAL_CAPSULE | Freq: Once | ORAL | Status: AC
  Administered 2022-01-02: 25 mg via ORAL
  Filled 2022-01-02: qty 1

## 2022-01-02 NOTE — MAU Provider Note (Signed)
History     CSN: [id number]  Arrival date and time: 01/02/22 1802   Event Date/Time   First Provider Initiated Contact with Patient 01/02/22 1844      Chief Complaint  Patient presents with   Headache   Sore Throat    eara   Otalgia   Emesis   26 y.o. G4P0030 @24 .3 wks presenting with HA, cough, sore throat, Rt ear pain, and N/V. Reports onset of sx today. Reports 2-3 episodes of vomiting today. Denies fever. Reports occasional SOB. Cough is non-productive. Reports partner recently had similar sx and had negative Covid and Flu test. HA is frontal and bilaterally temporal. She tried taking Tylenol but vomited right away. Reports intermittent LAP when she coughs. Denies VB or LOF. Reports good FM.    OB History     Gravida  4   Para      Term      Preterm      AB  3   Living  0      SAB  1   IAB  2   Ectopic      Multiple      Live Births  0        Obstetric Comments  Elective w/meds x2         Past Medical History:  Diagnosis Date   Anemia    Anxiety    Asthma    Depression    never on meds   Headache    Heart murmur    at birth   Kidney cysts    Ovarian cyst    Scoliosis     Past Surgical History:  Procedure Laterality Date   TONSILLECTOMY      Family History  Problem Relation Age of Onset   Healthy Mother    Healthy Father    Diabetes Maternal Grandfather    Heart disease Maternal Grandfather    Cancer Maternal Grandfather     Social History   Tobacco Use   Smoking status: Former    Types: Cigars   Smokeless tobacco: Never   Tobacco comments:    Black and mild (2022) and vapes (quit with preg)  Vaping Use   Vaping Use: Former   Substances: Nicotine, Flavoring  Substance Use Topics   Alcohol use: Not Currently   Drug use: No    Allergies:  Allergies  Allergen Reactions   Kiwi Extract Anaphylaxis   Aleve [Naproxen Sodium]     Stomach bleeds   Mango Flavor     Scratchy throat   Other     Eggplant - scratchy  throat    Pineapple     Scratchy throat     Medications Prior to Admission  Medication Sig Dispense Refill Last Dose   acetaminophen (TYLENOL) 500 MG tablet Take 2 tablets (1,000 mg total) by mouth every 6 (six) hours as needed for moderate pain. 30 tablet 0 01/02/2022 at 1800   guaiFENesin (MUCINEX) 600 MG 12 hr tablet Take 600 mg by mouth 2 (two) times daily.   01/02/2022   Blood Pressure Monitoring (BLOOD PRESSURE KIT) DEVI 1 kit by Does not apply route once a week. 1 each 0    cyclobenzaprine (FLEXERIL) 10 MG tablet Take 1 tablet (10 mg total) by mouth 3 (three) times daily as needed for muscle spasms. (Patient not taking: Reported on 10/31/2021) 30 tablet 0    Iron, Ferrous Sulfate, 325 (65 Fe) MG TABS Take 1 tablet by mouth every other day. (  Patient not taking: Reported on 12/26/2021) 30 tablet 3    metoCLOPramide (REGLAN) 10 MG tablet Take 1 tablet (10 mg total) by mouth every 6 (six) hours. (Patient not taking: Reported on 09/17/2021) 10 tablet 0    Prenatal Vit-Fe Fumarate-FA (MULTIVITAMIN-PRENATAL) 27-0.8 MG TABS tablet Take 1 tablet by mouth daily. 30 tablet 6    prochlorperazine (COMPAZINE) 10 MG tablet Take 1 tablet (10 mg total) by mouth every 6 (six) hours as needed for nausea or vomiting. (Patient not taking: Reported on 09/17/2021) 30 tablet 0     Review of Systems  Constitutional:  Negative for chills and fever.  HENT:  Positive for congestion, ear pain, rhinorrhea and sore throat.   Respiratory:  Positive for cough and shortness of breath.   Gastrointestinal:  Positive for abdominal pain.  Genitourinary:  Negative for vaginal bleeding and vaginal discharge.  Neurological:  Positive for headaches.   Physical Exam   Blood pressure 107/62, pulse 94, temperature 98.7 F (37.1 C), temperature source Oral, resp. rate 20, last menstrual period 07/15/2021, SpO2 100 %, unknown if currently breastfeeding.  Physical Exam Vitals and nursing note reviewed.  Constitutional:       General: She is not in acute distress.    Appearance: Normal appearance.  HENT:     Head: Normocephalic and atraumatic.     Right Ear: Hearing, tympanic membrane, ear canal and external ear normal.     Left Ear: Hearing, tympanic membrane, ear canal and external ear normal.     Nose:     Right Sinus: No maxillary sinus tenderness or frontal sinus tenderness.     Left Sinus: Frontal sinus tenderness present. No maxillary sinus tenderness.     Mouth/Throat:     Lips: Pink.     Mouth: Mucous membranes are moist.     Pharynx: Oropharynx is clear.     Tonsils: No tonsillar exudate or tonsillar abscesses.  Cardiovascular:     Rate and Rhythm: Normal rate and regular rhythm.     Heart sounds: Normal heart sounds.  Pulmonary:     Effort: Pulmonary effort is normal. No respiratory distress.     Breath sounds: Normal breath sounds. No stridor. No wheezing, rhonchi or rales.  Abdominal:     Palpations: Abdomen is soft.     Tenderness: There is no abdominal tenderness.     Comments: gravid  Musculoskeletal:        General: Normal range of motion.     Cervical back: Normal range of motion.  Skin:    General: Skin is warm and dry.  Neurological:     General: No focal deficit present.     Mental Status: She is alert and oriented to person, place, and time.  Psychiatric:        Mood and Affect: Mood normal.        Behavior: Behavior normal.   EFM: 145 bpm, mod variability, no accels, no decels Toco: none  Results for orders placed or performed during the hospital encounter of 01/02/22 (from the past 24 hour(s))  Urinalysis, Routine w reflex microscopic Urine, Clean Catch     Status: Abnormal   Collection Time: 01/02/22  6:35 PM  Result Value Ref Range   Color, Urine YELLOW YELLOW   APPearance CLOUDY (A) CLEAR   Specific Gravity, Urine 1.013 1.005 - 1.030   pH 7.0 5.0 - 8.0   Glucose, UA NEGATIVE NEGATIVE mg/dL   Hgb urine dipstick MODERATE (A) NEGATIVE   Bilirubin Urine  NEGATIVE  NEGATIVE   Ketones, ur NEGATIVE NEGATIVE mg/dL   Protein, ur NEGATIVE NEGATIVE mg/dL   Nitrite NEGATIVE NEGATIVE   Leukocytes,Ua LARGE (A) NEGATIVE   RBC / HPF >50 (H) 0 - 5 RBC/hpf   WBC, UA 11-20 0 - 5 WBC/hpf   Bacteria, UA FEW (A) NONE SEEN   Squamous Epithelial / LPF 6-10 0 - 5   Mucus PRESENT    Granular Casts, UA PRESENT    Amorphous Crystal PRESENT   Resp Panel by RT-PCR (Flu A&B, Covid) Anterior Nasal Swab     Status: None   Collection Time: 01/02/22  6:40 PM   Specimen: Anterior Nasal Swab  Result Value Ref Range   SARS Coronavirus 2 by RT PCR NEGATIVE NEGATIVE   Influenza A by PCR NEGATIVE NEGATIVE   Influenza B by PCR NEGATIVE NEGATIVE   MAU Course  Procedures Reglan Benadryl  MDM Labs ordered and reviewed. Sx improved. Likely viral. Discussed supportive care. UA with bacteria and WBCs, culture ordered. Stable for discharge home.   Assessment and Plan  [redacted] weeks gestation Respiratory virus Non-intractable HA  Discharge home Follow up at [facility] as scheduled Reglan prn (has Rx) Sudafed prn OTC Mucinex q12 hrs OTC  Allergies as of 01/02/2022       Reactions   Kiwi Extract Anaphylaxis   Aleve [naproxen Sodium]    Stomach bleeds   Mango Flavor    Scratchy throat   Other    Eggplant - scratchy throat    Pineapple    Scratchy throat         Medication List     TAKE these medications    acetaminophen 500 MG tablet Commonly known as: TYLENOL Take 2 tablets (1,000 mg total) by mouth every 6 (six) hours as needed for moderate pain.   Blood Pressure Kit Devi 1 kit by Does not apply route once a week.   cyclobenzaprine 10 MG tablet Commonly known as: FLEXERIL Take 1 tablet (10 mg total) by mouth 3 (three) times daily as needed for muscle spasms.   guaiFENesin 600 MG 12 hr tablet Commonly known as: MUCINEX Take 600 mg by mouth 2 (two) times daily.   Iron (Ferrous Sulfate) 325 (65 Fe) MG Tabs Take 1 tablet by mouth every other day.    metoCLOPramide 10 MG tablet Commonly known as: REGLAN Take 1 tablet (10 mg total) by mouth every 6 (six) hours.   multivitamin-prenatal 27-0.8 MG Tabs tablet Take 1 tablet by mouth daily.   prochlorperazine 10 MG tablet Commonly known as: COMPAZINE Take 1 tablet (10 mg total) by mouth every 6 (six) hours as needed for nausea or vomiting.        Julianne Handler, CNM 01/02/2022, 8:31 PM

## 2022-01-02 NOTE — MAU Note (Signed)
Harnoor Revuelta is a 26 y.o. at [redacted]w[redacted]d here in MAU reporting: woke up with a sore throat, gradual throughout the day, rt ear ache, has  a HA.  Took TYlenol, threw up right after she took  it.  Took Mucinex, unable to keep it down. Has thrown up a few times, now having sharp pain in lower abd.   Boyfriend had recent URI.  His covid and flu test were neg. Temp 98.6 No bleeding, leaking, reports +FM  Onset of complaint: started during the night Pain score: HA:8, ear 6, abd 7 Vitals:   01/02/22 1816 01/02/22 1819  BP:  107/62  Pulse:  94  Temp:  98.7 F (37.1 C)  SpO2: 100% 100%     Lab orders placed from triage:

## 2022-01-02 NOTE — Discharge Instructions (Signed)

## 2022-01-04 LAB — CULTURE, OB URINE: Culture: 40000 — AB
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
## 2022-01-24 ENCOUNTER — Other Ambulatory Visit: Payer: BC Managed Care – PPO

## 2022-01-24 ENCOUNTER — Encounter: Payer: Self-pay | Admitting: Family Medicine

## 2022-01-24 ENCOUNTER — Ambulatory Visit (INDEPENDENT_AMBULATORY_CARE_PROVIDER_SITE_OTHER): Payer: BC Managed Care – PPO | Admitting: Family Medicine

## 2022-01-24 VITALS — BP 111/63 | HR 80 | Wt 125.7 lb

## 2022-01-24 DIAGNOSIS — Z3480 Encounter for supervision of other normal pregnancy, unspecified trimester: Secondary | ICD-10-CM

## 2022-01-24 DIAGNOSIS — O283 Abnormal ultrasonic finding on antenatal screening of mother: Secondary | ICD-10-CM

## 2022-01-24 DIAGNOSIS — Z3A27 27 weeks gestation of pregnancy: Secondary | ICD-10-CM

## 2022-01-24 NOTE — Addendum Note (Signed)
Addended by: Natale Milch D on: 01/24/2022 09:32 AM   Modules accepted: Orders

## 2022-01-24 NOTE — Addendum Note (Signed)
Addended byWarner Mccreedy on: 01/24/2022 08:54 AM   Modules accepted: Orders

## 2022-01-25 LAB — RPR: RPR Ser Ql: NONREACTIVE

## 2022-01-25 LAB — CBC
Hematocrit: 29.1 % — ABNORMAL LOW (ref 34.0–46.6)
Hemoglobin: 9.8 g/dL — ABNORMAL LOW (ref 11.1–15.9)
MCH: 30.6 pg (ref 26.6–33.0)
MCHC: 33.7 g/dL (ref 31.5–35.7)
MCV: 91 fL (ref 79–97)
Platelets: 165 10*3/uL (ref 150–450)
RBC: 3.2 x10E6/uL — ABNORMAL LOW (ref 3.77–5.28)
RDW: 11.6 % — ABNORMAL LOW (ref 11.7–15.4)
WBC: 8.1 10*3/uL (ref 3.4–10.8)

## 2022-01-25 LAB — GLUCOSE TOLERANCE, 2 HOURS W/ 1HR
Glucose, 1 hour: 128 mg/dL (ref 70–179)
Glucose, 2 hour: 88 mg/dL (ref 70–152)
Glucose, Fasting: 75 mg/dL (ref 70–91)

## 2022-01-25 LAB — HIV ANTIBODY (ROUTINE TESTING W REFLEX): HIV Screen 4th Generation wRfx: NONREACTIVE

## 2022-01-29 ENCOUNTER — Other Ambulatory Visit: Payer: Self-pay | Admitting: Family Medicine

## 2022-01-29 DIAGNOSIS — Z3480 Encounter for supervision of other normal pregnancy, unspecified trimester: Secondary | ICD-10-CM

## 2022-01-29 DIAGNOSIS — O99019 Anemia complicating pregnancy, unspecified trimester: Secondary | ICD-10-CM | POA: Insufficient documentation

## 2022-01-29 MED ORDER — IRON (FERROUS SULFATE) 325 (65 FE) MG PO TABS: 1.0000 | ORAL_TABLET | ORAL | 3 refills | Status: DC

## 2022-02-11 ENCOUNTER — Encounter (HOSPITAL_COMMUNITY): Payer: Self-pay | Admitting: Obstetrics & Gynecology

## 2022-02-11 ENCOUNTER — Inpatient Hospital Stay (HOSPITAL_COMMUNITY)
Admission: AD | Admit: 2022-02-11 | Discharge: 2022-02-11 | Disposition: A | Discharge status: Home / Self Care | Payer: BC Managed Care – PPO | Attending: Obstetrics & Gynecology | Admitting: Obstetrics & Gynecology

## 2022-02-11 DIAGNOSIS — A749 Chlamydial infection, unspecified: Secondary | ICD-10-CM | POA: Insufficient documentation

## 2022-02-11 DIAGNOSIS — O26899 Other specified pregnancy related conditions, unspecified trimester: Secondary | ICD-10-CM | POA: Diagnosis not present

## 2022-02-11 DIAGNOSIS — J45909 Unspecified asthma, uncomplicated: Secondary | ICD-10-CM | POA: Diagnosis not present

## 2022-02-11 DIAGNOSIS — O283 Abnormal ultrasonic finding on antenatal screening of mother: Secondary | ICD-10-CM

## 2022-02-11 DIAGNOSIS — Z3A30 30 weeks gestation of pregnancy: Secondary | ICD-10-CM | POA: Insufficient documentation

## 2022-02-11 DIAGNOSIS — O26893 Other specified pregnancy related conditions, third trimester: Secondary | ICD-10-CM | POA: Diagnosis present

## 2022-02-11 DIAGNOSIS — R102 Pelvic and perineal pain: Secondary | ICD-10-CM | POA: Insufficient documentation

## 2022-02-11 DIAGNOSIS — O99333 Smoking (tobacco) complicating pregnancy, third trimester: Secondary | ICD-10-CM | POA: Diagnosis not present

## 2022-02-11 DIAGNOSIS — O99513 Diseases of the respiratory system complicating pregnancy, third trimester: Secondary | ICD-10-CM | POA: Insufficient documentation

## 2022-02-11 DIAGNOSIS — M549 Dorsalgia, unspecified: Secondary | ICD-10-CM | POA: Diagnosis present

## 2022-02-11 DIAGNOSIS — N949 Unspecified condition associated with female genital organs and menstrual cycle: Secondary | ICD-10-CM

## 2022-02-11 LAB — URINALYSIS, ROUTINE W REFLEX MICROSCOPIC
Bilirubin Urine: NEGATIVE
Glucose, UA: NEGATIVE mg/dL
Ketones, ur: NEGATIVE mg/dL
Nitrite: NEGATIVE
Protein, ur: NEGATIVE mg/dL
Specific Gravity, Urine: 1.015 (ref 1.005–1.030)
pH: 7 (ref 5.0–8.0)

## 2022-02-11 LAB — WET PREP, GENITAL
Clue Cells Wet Prep HPF POC: NONE SEEN
Sperm: NONE SEEN
Trich, Wet Prep: NONE SEEN
WBC, Wet Prep HPF POC: >=10 — AB (ref <10)
Yeast Wet Prep HPF POC: NONE SEEN

## 2022-02-11 LAB — POCT FERN TEST: POCT Fern Test: NEGATIVE

## 2022-02-11 MED ORDER — CYCLOBENZAPRINE HCL 5 MG PO TABS
10.0000 mg | ORAL_TABLET | Freq: Once | ORAL | Status: AC
Start: 2022-02-11 — End: 2022-02-11
  Administered 2022-02-11: 10 mg via ORAL
  Filled 2022-02-11: qty 2

## 2022-02-11 MED ORDER — CYCLOBENZAPRINE HCL 5 MG PO TABS: 5.0000 mg | ORAL_TABLET | Freq: Three times a day (TID) | ORAL | 0 refills | Status: DC | PRN

## 2022-02-11 MED ORDER — ACETAMINOPHEN 500 MG PO TABS
1000.0000 mg | ORAL_TABLET | Freq: Once | ORAL | Status: AC
  Administered 2022-02-11: 1000 mg via ORAL
  Filled 2022-02-11: qty 2

## 2022-02-11 NOTE — Discharge Instructions (Signed)

## 2022-02-11 NOTE — MAU Provider Note (Signed)
History     CSN: [id number]  Arrival date and time: 02/11/22 0702   Event Date/Time   First Provider Initiated Contact with Patient 02/11/22 0911      Chief Complaint  Patient presents with   Pelvic Pain   Jill Patel, a  26 y.o. G4P0030 at 23w1dpresents to MAU with complaints of Lower stomach, vagina, and back pain. Patient states that she has been having vaginal pain for last 2 weeks but since last night has gotten worse. Currently rating pain 6/10. Patient states "it hurts to walk." And noted more pain with movement. States once she is resting "its not that bad." She also endorses some mild vaginal bleeding after intercourse last night, but none since. She denies pain with intercourse. She has made No attempts at relief. Denies abnormal discharge. +FM.  Denies urinary frequcy, during exam patient states she she feels leaking of fluid.     OB History     Gravida  4   Para      Term      Preterm      AB  3   Living  0      SAB  1   IAB  2   Ectopic      Multiple      Live Births  0        Obstetric Comments  Elective w/meds x2         Past Medical History:  Diagnosis Date   Anemia    Anxiety    Asthma    Depression    never on meds   Headache    Heart murmur    at birth   Kidney cysts    Ovarian cyst    Scoliosis     Past Surgical History:  Procedure Laterality Date   TONSILLECTOMY      Family History  Problem Relation Age of Onset   Healthy Mother    Healthy Father    Diabetes Maternal Grandfather    Heart disease Maternal Grandfather    Cancer Maternal Grandfather     Social History   Tobacco Use   Smoking status: Former    Types: Cigars   Smokeless tobacco: Never   Tobacco comments:    Black and mild (2022) and vapes (quit with preg)  Vaping Use   Vaping Use: Former   Substances: Nicotine, Flavoring  Substance Use Topics   Alcohol use: Not Currently   Drug use: No    Allergies:  Allergies  Allergen  Reactions   Kiwi Extract Anaphylaxis   Aleve [Naproxen Sodium]     Stomach bleeds   Mango Flavor     Scratchy throat   Other     Eggplant - scratchy throat    Pineapple     Scratchy throat     Medications Prior to Admission  Medication Sig Dispense Refill Last Dose   acetaminophen (TYLENOL) 500 MG tablet Take 2 tablets (1,000 mg total) by mouth every 6 (six) hours as needed for moderate pain. (Patient not taking: Reported on 01/24/2022) 30 tablet 0    Blood Pressure Monitoring (BLOOD PRESSURE KIT) DEVI 1 kit by Does not apply route once a week. (Patient not taking: Reported on 01/24/2022) 1 each 0    cyclobenzaprine (FLEXERIL) 10 MG tablet Take 1 tablet (10 mg total) by mouth 3 (three) times daily as needed for muscle spasms. (Patient not taking: Reported on 10/31/2021) 30 tablet 0    guaiFENesin (MUCINEX) 600 MG  12 hr tablet Take 600 mg by mouth 2 (two) times daily. (Patient not taking: Reported on 01/24/2022)      Iron, Ferrous Sulfate, 325 (65 Fe) MG TABS Take 1 tablet by mouth every other day. 30 tablet 3    metoCLOPramide (REGLAN) 10 MG tablet Take 1 tablet (10 mg total) by mouth every 6 (six) hours. (Patient not taking: Reported on 09/17/2021) 10 tablet 0    Prenatal Vit-Fe Fumarate-FA (MULTIVITAMIN-PRENATAL) 27-0.8 MG TABS tablet Take 1 tablet by mouth daily. 30 tablet 6    prochlorperazine (COMPAZINE) 10 MG tablet Take 1 tablet (10 mg total) by mouth every 6 (six) hours as needed for nausea or vomiting. (Patient not taking: Reported on 09/17/2021) 30 tablet 0     Review of Systems  Constitutional:  Negative for appetite change, chills, fatigue and fever.  Respiratory:  Negative for chest tightness, shortness of breath and wheezing.   Cardiovascular:  Negative for chest pain.  Gastrointestinal:  Positive for abdominal pain. Negative for abdominal distention, constipation, diarrhea, nausea and vomiting.  Genitourinary:  Positive for flank pain, pelvic pain and vaginal bleeding. Negative for  difficulty urinating, dyspareunia, dysuria, vaginal discharge and vaginal pain.  Musculoskeletal:  Positive for back pain.  Neurological:  Negative for weakness, light-headedness and headaches.  Psychiatric/Behavioral:  The patient is nervous/anxious.    Physical Exam   Blood pressure 107/65, pulse 76, temperature 98.8 F (37.1 C), resp. rate 18, height 5' 3"  (1.6 m), weight 57.2 kg, last menstrual period 07/15/2021, SpO2 100 %, unknown if currently breastfeeding.  Physical Exam Vitals and nursing note reviewed. Exam conducted with a chaperone present.  Constitutional:      General: She is not in acute distress.    Appearance: Normal appearance.  HENT:     Head: Normocephalic.  Cardiovascular:     Rate and Rhythm: Normal rate.  Pulmonary:     Effort: Pulmonary effort is normal.     Breath sounds: Normal breath sounds.  Abdominal:     Palpations: Abdomen is soft.     Tenderness: There is abdominal tenderness. There is guarding. There is no right CVA tenderness or left CVA tenderness.  Genitourinary:    General: Normal vulva.  Musculoskeletal:        General: No swelling. Normal range of motion.     Cervical back: Normal range of motion.  Skin:    General: Skin is warm and dry.  Neurological:     Mental Status: She is alert and oriented to person, place, and time.  Psychiatric:        Mood and Affect: Mood normal.    FHT: 130 bpm with moderate variability. Accels present occasional variable decels with quick return to baseline noted (appropriate for gestational age).  Toco: Uterine irritability   Dilation: Closed Effacement (%): Thick Cervical Position: Middle Station: Ballotable Exam by:: Huntley Dec CNM   MAU Course  Procedures  Lab Orders         Wet prep, genital         Culture, OB Urine         Urinalysis, Routine w reflex microscopic Urine, Clean Catch         Fern Test    Meds ordered this encounter  Medications   cyclobenzaprine (FLEXERIL) tablet 10 mg    acetaminophen (TYLENOL) tablet 1,000 mg   cyclobenzaprine (FLEXERIL) 5 MG tablet    Sig: Take 1 tablet (5 mg total) by mouth 3 (three) times daily as needed for  muscle spasms.    Dispense:  20 tablet    Refill:  0    Order Specific Question:   Supervising Provider    Answer:   Merrily Pew    Results for orders placed or performed during the hospital encounter of 02/11/22 (from the past 24 hour(s))  Urinalysis, Routine w reflex microscopic Urine, Clean Catch     Status: Abnormal   Collection Time: 02/11/22  7:49 AM  Result Value Ref Range   Color, Urine YELLOW YELLOW   APPearance CLOUDY (A) CLEAR   Specific Gravity, Urine 1.015 1.005 - 1.030   pH 7.0 5.0 - 8.0   Glucose, UA NEGATIVE NEGATIVE mg/dL   Hgb urine dipstick SMALL (A) NEGATIVE   Bilirubin Urine NEGATIVE NEGATIVE   Ketones, ur NEGATIVE NEGATIVE mg/dL   Protein, ur NEGATIVE NEGATIVE mg/dL   Nitrite NEGATIVE NEGATIVE   Leukocytes,Ua SMALL (A) NEGATIVE   RBC / HPF 21-50 0 - 5 RBC/hpf   WBC, UA 21-50 0 - 5 WBC/hpf   Bacteria, UA RARE (A) NONE SEEN   Squamous Epithelial / LPF 0-5 0 - 5   Mucus PRESENT    Sperm, UA PRESENT   Wet prep, genital     Status: Abnormal   Collection Time: 02/11/22  9:48 AM   Specimen: PATH Cytology Cervicovaginal Ancillary Only  Result Value Ref Range   Yeast Wet Prep HPF POC NONE SEEN NONE SEEN   Trich, Wet Prep NONE SEEN NONE SEEN   Clue Cells Wet Prep HPF POC NONE SEEN NONE SEEN   WBC, Wet Prep HPF POC >=10 (A) <10   Sperm NONE SEEN   Fern Test     Status: None   Collection Time: 02/11/22  9:51 AM  Result Value Ref Range   POCT Fern Test Negative = intact amniotic membranes      MDM Pain improved with PO flexeril and Tylenol Urine sent for Culture  Fern Negative  Wet Prep negative  Cervix closed  Low suspicion for Preterm Labor   Assessment and Plan   1. Chlamydia infection affecting pregnancy in second trimester   2. Echogenic intracardiac focus of fetus on prenatal  ultrasound   3. Round ligament pain   4. Pelvic pressure in pregnancy   5. [redacted] weeks gestation of pregnancy   - Discussed that this is is most likely Round ligament pain and that it can be a normal discomfort of pregnancy. Also discussed normalcy of increase in vaginal pressure as pregnancy progresses.   - Discussed comfort measures for relief including maternity support belts, stretching and walking.  - Recommended PO Flexeril at bedtime if patient having trouble sleeping or PO Tylenol during the day. Rx sent to outpatient pharmacy.  - FHT appropriate for gestational age upon discharge.  - Preterm labor precautions reviewed.  - Patient discharged home in stable condition and may return to MAU as needed.   Jacquiline Doe, MSN, CNM  02/11/2022, 9:11 AM

## 2022-02-12 LAB — GC/CHLAMYDIA PROBE AMP (~~LOC~~) NOT AT ARMC
Chlamydia: NEGATIVE
Comment: NEGATIVE
Comment: NORMAL
Neisseria Gonorrhea: NEGATIVE

## 2022-02-13 ENCOUNTER — Ambulatory Visit (INDEPENDENT_AMBULATORY_CARE_PROVIDER_SITE_OTHER): Payer: BC Managed Care – PPO | Admitting: Obstetrics and Gynecology

## 2022-02-13 ENCOUNTER — Encounter: Payer: Self-pay | Admitting: Obstetrics and Gynecology

## 2022-02-13 VITALS — BP 99/64 | HR 87 | Wt 131.5 lb

## 2022-02-13 DIAGNOSIS — Z3480 Encounter for supervision of other normal pregnancy, unspecified trimester: Secondary | ICD-10-CM

## 2022-02-13 DIAGNOSIS — A749 Chlamydial infection, unspecified: Secondary | ICD-10-CM

## 2022-02-13 DIAGNOSIS — O283 Abnormal ultrasonic finding on antenatal screening of mother: Secondary | ICD-10-CM

## 2022-02-13 DIAGNOSIS — Z3A30 30 weeks gestation of pregnancy: Secondary | ICD-10-CM

## 2022-02-13 DIAGNOSIS — D563 Thalassemia minor: Secondary | ICD-10-CM

## 2022-02-13 DIAGNOSIS — O98813 Other maternal infectious and parasitic diseases complicating pregnancy, third trimester: Secondary | ICD-10-CM

## 2022-02-13 LAB — CULTURE, OB URINE: Culture: 40000 — AB

## 2022-02-13 MED ORDER — PRENATAL 27-0.8 MG PO TABS: 1.0000 | ORAL_TABLET | Freq: Every day | ORAL | 6 refills | Status: DC

## 2022-02-13 NOTE — Progress Notes (Signed)
Pt presents for ROB visit. Pt was seen at MAU 7/24 for round ligament pain and pressure. Pt got maternity band and has had some relief.  Pt requesting Rx for prenatal vitamin with iron.
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
## 2022-02-14 ENCOUNTER — Other Ambulatory Visit: Payer: Self-pay | Admitting: Family Medicine

## 2022-02-14 ENCOUNTER — Encounter: Payer: Self-pay | Admitting: Family Medicine

## 2022-02-14 DIAGNOSIS — O2340 Unspecified infection of urinary tract in pregnancy, unspecified trimester: Secondary | ICD-10-CM | POA: Insufficient documentation

## 2022-02-14 MED ORDER — NITROFURANTOIN MONOHYD MACRO 100 MG PO CAPS: 100.0000 mg | ORAL_CAPSULE | Freq: Two times a day (BID) | ORAL | 0 refills | Status: DC

## 2022-02-14 NOTE — Progress Notes (Signed)
UTI treatment for MAU Ucx w S.saprophyticus

## 2022-02-27 ENCOUNTER — Encounter: Payer: Self-pay | Admitting: Obstetrics and Gynecology

## 2022-02-27 ENCOUNTER — Ambulatory Visit (INDEPENDENT_AMBULATORY_CARE_PROVIDER_SITE_OTHER): Payer: BC Managed Care – PPO | Admitting: Medical

## 2022-02-27 VITALS — BP 114/63 | HR 86 | Wt 136.5 lb

## 2022-02-27 DIAGNOSIS — Z3483 Encounter for supervision of other normal pregnancy, third trimester: Secondary | ICD-10-CM

## 2022-02-27 DIAGNOSIS — Z3480 Encounter for supervision of other normal pregnancy, unspecified trimester: Secondary | ICD-10-CM

## 2022-02-27 DIAGNOSIS — A749 Chlamydial infection, unspecified: Secondary | ICD-10-CM

## 2022-02-27 DIAGNOSIS — O98813 Other maternal infectious and parasitic diseases complicating pregnancy, third trimester: Secondary | ICD-10-CM

## 2022-02-27 DIAGNOSIS — Z3A32 32 weeks gestation of pregnancy: Secondary | ICD-10-CM

## 2022-02-27 DIAGNOSIS — O99013 Anemia complicating pregnancy, third trimester: Secondary | ICD-10-CM

## 2022-02-27 DIAGNOSIS — D563 Thalassemia minor: Secondary | ICD-10-CM

## 2022-02-27 DIAGNOSIS — O283 Abnormal ultrasonic finding on antenatal screening of mother: Secondary | ICD-10-CM

## 2022-02-27 NOTE — Progress Notes (Signed)
Patient [redacted]w[redacted]d presents for ROB. No concerns at this time.
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
## 2022-03-05 ENCOUNTER — Telehealth: Payer: Self-pay

## 2022-03-05 NOTE — Telephone Encounter (Signed)
Written orders for Breast Pump signed and faxed to [facility] 03/05/2022

## 2022-03-11 ENCOUNTER — Ambulatory Visit (INDEPENDENT_AMBULATORY_CARE_PROVIDER_SITE_OTHER): Payer: BC Managed Care – PPO | Admitting: Advanced Practice Midwife

## 2022-03-11 VITALS — BP 102/60 | HR 86 | Wt 135.0 lb

## 2022-03-11 DIAGNOSIS — O99013 Anemia complicating pregnancy, third trimester: Secondary | ICD-10-CM | POA: Diagnosis not present

## 2022-03-11 DIAGNOSIS — Z3480 Encounter for supervision of other normal pregnancy, unspecified trimester: Secondary | ICD-10-CM

## 2022-03-11 DIAGNOSIS — Z3A34 34 weeks gestation of pregnancy: Secondary | ICD-10-CM
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
## 2022-03-12 LAB — CBC
Hematocrit: 30.7 % — ABNORMAL LOW (ref 34.0–46.6)
Hemoglobin: 9.6 g/dL — ABNORMAL LOW (ref 11.1–15.9)
MCH: 28.9 pg (ref 26.6–33.0)
MCHC: 31.3 g/dL — ABNORMAL LOW (ref 31.5–35.7)
MCV: 93 fL (ref 79–97)
Platelets: 117 10*3/uL — ABNORMAL LOW (ref 150–450)
RBC: 3.32 x10E6/uL — ABNORMAL LOW (ref 3.77–5.28)
RDW: 13.4 % (ref 11.7–15.4)
WBC: 7.7 10*3/uL (ref 3.4–10.8)

## 2022-03-13 ENCOUNTER — Other Ambulatory Visit (HOSPITAL_BASED_OUTPATIENT_CLINIC_OR_DEPARTMENT_OTHER): Payer: Self-pay | Admitting: Advanced Practice Midwife

## 2022-03-13 NOTE — Progress Notes (Signed)
IV iron infusion orders placed.  Message sent to [facility] to schedule outpatient infusion.

## 2022-03-27 ENCOUNTER — Encounter: Payer: Self-pay | Admitting: Advanced Practice Midwife

## 2022-03-27 ENCOUNTER — Other Ambulatory Visit (HOSPITAL_COMMUNITY)
Admission: RE | Admit: 2022-03-27 | Discharge: 2022-03-27 | Disposition: A | Discharge status: Home / Self Care | Payer: BC Managed Care – PPO | Source: Ambulatory Visit | Attending: Advanced Practice Midwife | Admitting: Advanced Practice Midwife

## 2022-03-27 ENCOUNTER — Ambulatory Visit (INDEPENDENT_AMBULATORY_CARE_PROVIDER_SITE_OTHER): Payer: BC Managed Care – PPO | Admitting: Advanced Practice Midwife

## 2022-03-27 VITALS — BP 113/68 | HR 76 | Wt 138.6 lb

## 2022-03-27 DIAGNOSIS — Z3A36 36 weeks gestation of pregnancy: Secondary | ICD-10-CM

## 2022-03-27 DIAGNOSIS — D696 Thrombocytopenia, unspecified: Secondary | ICD-10-CM

## 2022-03-27 DIAGNOSIS — O99113 Other diseases of the blood and blood-forming organs and certain disorders involving the immune mechanism complicating pregnancy, third trimester: Secondary | ICD-10-CM

## 2022-03-27 DIAGNOSIS — Z3480 Encounter for supervision of other normal pregnancy, unspecified trimester: Secondary | ICD-10-CM

## 2022-03-27 DIAGNOSIS — O99013 Anemia complicating pregnancy, third trimester: Secondary | ICD-10-CM

## 2022-03-27 DIAGNOSIS — Z3483 Encounter for supervision of other normal pregnancy, third trimester: Secondary | ICD-10-CM | POA: Diagnosis not present
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
## 2022-03-27 NOTE — Progress Notes (Signed)
Pt presents for routine prenatal visit. Pt is a little anxious and GAD was positive. I informed her that we have someone here she can talk to, and have resources for her if needed. She states she has a good support system and that she doesn't think talking to anyone will help. No further questions or concerns at this time.

## 2022-03-28 LAB — CBC
Hematocrit: 31.6 % — ABNORMAL LOW (ref 34.0–46.6)
Hemoglobin: 10.3 g/dL — ABNORMAL LOW (ref 11.1–15.9)
MCH: 29.7 pg (ref 26.6–33.0)
MCHC: 32.6 g/dL (ref 31.5–35.7)
MCV: 91 fL (ref 79–97)
Platelets: 106 10*3/uL — ABNORMAL LOW (ref 150–450)
RBC: 3.47 x10E6/uL — ABNORMAL LOW (ref 3.77–5.28)
RDW: 13.1 % (ref 11.7–15.4)
WBC: 7.2 10*3/uL (ref 3.4–10.8)

## 2022-03-28 LAB — CERVICOVAGINAL ANCILLARY ONLY
Chlamydia: NEGATIVE
Comment: NEGATIVE
Comment: NEGATIVE
Comment: NORMAL
Neisseria Gonorrhea: NEGATIVE
Trichomonas: NEGATIVE

## 2022-03-31 LAB — STREP GP B CULTURE+RFLX: Strep Gp B Culture+Rflx: NEGATIVE

## 2022-04-04 ENCOUNTER — Encounter: Payer: Self-pay | Admitting: Obstetrics and Gynecology

## 2022-04-04 ENCOUNTER — Ambulatory Visit (INDEPENDENT_AMBULATORY_CARE_PROVIDER_SITE_OTHER): Payer: BC Managed Care – PPO | Admitting: Obstetrics and Gynecology

## 2022-04-04 VITALS — BP 105/66 | HR 68 | Wt 142.3 lb

## 2022-04-04 DIAGNOSIS — Z3480 Encounter for supervision of other normal pregnancy, unspecified trimester: Secondary | ICD-10-CM

## 2022-04-04 DIAGNOSIS — Z3A37 37 weeks gestation of pregnancy: Secondary | ICD-10-CM

## 2022-04-04 DIAGNOSIS — Z3483 Encounter for supervision of other normal pregnancy, third trimester: Secondary | ICD-10-CM

## 2022-04-04 NOTE — Progress Notes (Signed)
Patient presents for ROB visit. No concerns at this time.   

## 2022-04-04 NOTE — Progress Notes (Signed)
   LOW-RISK PREGNANCY OFFICE VISIT Patient name: Jill Patel MRN [id number]  Date of birth: [birth year] Chief Complaint:   Routine Prenatal Visit  History of Present Illness:   Jill Patel is a 26 y.o. G9P0030 female at [redacted]w[redacted]d with an Estimated Date of Delivery: 04/21/22 being seen today for ongoing management of a low-risk pregnancy.  Today she reports no complaints. Contractions: Irritability. Vag. Bleeding: None.  Movement: Present. denies leaking of fluid. Review of Systems:   Pertinent items are noted in HPI Denies abnormal vaginal discharge w/ itching/odor/irritation, headaches, visual changes, shortness of breath, chest pain, abdominal pain, severe nausea/vomiting, or problems with urination or bowel movements unless otherwise stated above. Pertinent History Reviewed:  Reviewed past medical,surgical, social, obstetrical and family history.  Reviewed problem list, medications and allergies. Physical Assessment:   Vitals:   04/04/22 0907  BP: 105/66  Pulse: 68  Weight: 142 lb 4.8 oz (64.5 kg)  Body mass index is 25.21 kg/m.        Physical Examination:   General appearance: Well appearing, and in no distress  Mental status: Alert, oriented to person, place, and time  Skin: Warm & dry  Cardiovascular: Normal heart rate noted  Respiratory: Normal respiratory effort, no distress  Abdomen: Soft, gravid, nontender  Pelvic: Cervical exam deferred         Extremities: Edema: Trace  Fetal Status: Fetal Heart Rate (bpm): 140 Fundal Height: 37 cm Movement: Present Presentation: Vertex  No results found for this or any previous visit (from the past 24 hour(s)).  Assessment & Plan:  1) Low-risk pregnancy G4P0030 at [redacted]w[redacted]d with an Estimated Date of Delivery: 04/21/22   2) Supervision of other normal pregnancy, antepartum - Discussed labor s/sx's - Excited about planned waterbirth - Birth Plan completed, but notebook is packed away in the hospital bag at home-- has pics on  phone - pics sent via MyChart for review  3) [redacted] weeks gestation of pregnancy    Meds: No orders of the defined types were placed in this encounter.  Labs/procedures today: none  Plan:  Continue routine obstetrical care   Reviewed: Term labor symptoms and general obstetric precautions including but not limited to vaginal bleeding, contractions, leaking of fluid and fetal movement were reviewed in detail with the patient.  All questions were answered. Has home bp cuff. Check bp weekly, let us know if >140/90.   Follow-up: Return in about 1 week (around 04/11/2022) for Return OB visit.  No orders of the defined types were placed in this encounter.  Raelyn Mora MSN, CNM 04/04/2022 9:48 AM

## 2022-04-09 ENCOUNTER — Ambulatory Visit (INDEPENDENT_AMBULATORY_CARE_PROVIDER_SITE_OTHER): Payer: BC Managed Care – PPO

## 2022-04-09 VITALS — BP 110/66 | HR 82 | Wt 143.0 lb

## 2022-04-09 DIAGNOSIS — Z3483 Encounter for supervision of other normal pregnancy, third trimester: Secondary | ICD-10-CM

## 2022-04-09 DIAGNOSIS — Z3A38 38 weeks gestation of pregnancy: Secondary | ICD-10-CM

## 2022-04-09 DIAGNOSIS — Z3480 Encounter for supervision of other normal pregnancy, unspecified trimester: Secondary | ICD-10-CM

## 2022-04-09 NOTE — Patient Instructions (Signed)

## 2022-04-09 NOTE — Progress Notes (Signed)
Pt presents for ROB.   Reports ctx last night that were 15-20 minutes apart.  Reports leakage of fluid that began yesterday.  Reports +fetal movement, denies vaginal bleeding.  Desires cervical check
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
## 2022-04-15 ENCOUNTER — Encounter (HOSPITAL_COMMUNITY): Payer: Self-pay | Admitting: Family Medicine

## 2022-04-15 ENCOUNTER — Inpatient Hospital Stay (HOSPITAL_COMMUNITY)
Admission: AD | Admit: 2022-04-15 | Discharge: 2022-04-18 | DRG: 806 | Disposition: A | Discharge status: Home / Self Care | Payer: BC Managed Care – PPO | Attending: Family Medicine | Admitting: Family Medicine

## 2022-04-15 ENCOUNTER — Other Ambulatory Visit: Payer: Self-pay

## 2022-04-15 DIAGNOSIS — Z87891 Personal history of nicotine dependence: Secondary | ICD-10-CM

## 2022-04-15 DIAGNOSIS — Z3A39 39 weeks gestation of pregnancy: Secondary | ICD-10-CM | POA: Diagnosis not present

## 2022-04-15 DIAGNOSIS — O9902 Anemia complicating childbirth: Secondary | ICD-10-CM | POA: Diagnosis present

## 2022-04-15 DIAGNOSIS — D696 Thrombocytopenia, unspecified: Secondary | ICD-10-CM | POA: Diagnosis not present

## 2022-04-15 DIAGNOSIS — O99019 Anemia complicating pregnancy, unspecified trimester: Secondary | ICD-10-CM | POA: Diagnosis present

## 2022-04-15 DIAGNOSIS — O9912 Other diseases of the blood and blood-forming organs and certain disorders involving the immune mechanism complicating childbirth: Secondary | ICD-10-CM | POA: Diagnosis not present

## 2022-04-15 DIAGNOSIS — O283 Abnormal ultrasonic finding on antenatal screening of mother: Secondary | ICD-10-CM | POA: Diagnosis present

## 2022-04-15 DIAGNOSIS — D563 Thalassemia minor: Secondary | ICD-10-CM | POA: Diagnosis present

## 2022-04-15 DIAGNOSIS — O368130 Decreased fetal movements, third trimester, not applicable or unspecified: Secondary | ICD-10-CM | POA: Diagnosis not present

## 2022-04-15 DIAGNOSIS — O98813 Other maternal infectious and parasitic diseases complicating pregnancy, third trimester: Principal | ICD-10-CM

## 2022-04-15 DIAGNOSIS — A749 Chlamydial infection, unspecified: Principal | ICD-10-CM

## 2022-04-15 DIAGNOSIS — Z3480 Encounter for supervision of other normal pregnancy, unspecified trimester: Secondary | ICD-10-CM

## 2022-04-15 DIAGNOSIS — O99119 Other diseases of the blood and blood-forming organs and certain disorders involving the immune mechanism complicating pregnancy, unspecified trimester: Secondary | ICD-10-CM | POA: Diagnosis present

## 2022-04-15 LAB — TYPE AND SCREEN
ABO/RH(D): A POS
Antibody Screen: NEGATIVE

## 2022-04-15 LAB — AMNISURE RUPTURE OF MEMBRANE (ROM) NOT AT ARMC: Amnisure ROM: NEGATIVE

## 2022-04-15 LAB — POCT FERN TEST: POCT Fern Test: NEGATIVE

## 2022-04-15 MED ORDER — MISOPROSTOL 25 MCG QUARTER TABLET
25.0000 ug | ORAL_TABLET | Freq: Once | ORAL | Status: AC
  Administered 2022-04-15: 25 ug via VAGINAL
  Filled 2022-04-15: qty 1

## 2022-04-15 MED ORDER — FENTANYL CITRATE (PF) 100 MCG/2ML IJ SOLN
50.0000 ug | INTRAMUSCULAR | Status: DC | PRN
  Administered 2022-04-16: 50 ug via INTRAVENOUS
  Filled 2022-04-15: qty 2

## 2022-04-15 MED ORDER — OXYTOCIN-SODIUM CHLORIDE 30-0.9 UT/500ML-% IV SOLN: 2.5000 [IU]/h | INTRAVENOUS | Status: DC

## 2022-04-15 MED ORDER — ONDANSETRON HCL 4 MG/2ML IJ SOLN: 4.0000 mg | Freq: Four times a day (QID) | INTRAMUSCULAR | Status: DC | PRN

## 2022-04-15 MED ORDER — SOD CITRATE-CITRIC ACID 500-334 MG/5ML PO SOLN: 30.0000 mL | ORAL | Status: DC | PRN

## 2022-04-15 MED ORDER — OXYTOCIN BOLUS FROM INFUSION
333.0000 mL | Freq: Once | INTRAVENOUS | Status: AC
  Administered 2022-04-16: 333 mL via INTRAVENOUS

## 2022-04-15 MED ORDER — LACTATED RINGERS IV SOLN: INTRAVENOUS | Status: DC

## 2022-04-15 MED ORDER — ACETAMINOPHEN 325 MG PO TABS
650.0000 mg | ORAL_TABLET | ORAL | Status: DC | PRN
  Administered 2022-04-16: 650 mg via ORAL
  Filled 2022-04-15: qty 2

## 2022-04-15 MED ORDER — LACTATED RINGERS IV SOLN: 500.0000 mL | INTRAVENOUS | Status: DC | PRN

## 2022-04-15 MED ORDER — OXYCODONE-ACETAMINOPHEN 5-325 MG PO TABS: 1.0000 | ORAL_TABLET | ORAL | Status: DC | PRN

## 2022-04-15 MED ORDER — MISOPROSTOL 50MCG HALF TABLET
50.0000 ug | ORAL_TABLET | Freq: Once | ORAL | Status: AC
  Administered 2022-04-15: 50 ug via ORAL
  Filled 2022-04-15: qty 1

## 2022-04-15 MED ORDER — LIDOCAINE HCL (PF) 1 % IJ SOLN: 30.0000 mL | INTRAMUSCULAR | Status: DC | PRN

## 2022-04-15 MED ORDER — TERBUTALINE SULFATE 1 MG/ML IJ SOLN: 0.2500 mg | Freq: Once | INTRAMUSCULAR | Status: DC | PRN

## 2022-04-15 MED ORDER — OXYCODONE-ACETAMINOPHEN 5-325 MG PO TABS: 2.0000 | ORAL_TABLET | ORAL | Status: DC | PRN

## 2022-04-15 NOTE — Progress Notes (Signed)
RN called to room because patient found a bloody gauze on her labor bed that did not belong to her. RN disposed of gauze, offered sincere apologizes and offered to move patient to new, clean room. Patient agreed.

## 2022-04-15 NOTE — MAU Provider Note (Signed)
Event Date/Time   First Provider Initiated Contact with Patient 04/15/22 1347     S: Ms. Jill Patel is a 26 y.o. G4P0030 at [redacted]w[redacted]d  who presents to MAU today complaining of leaking of fluid since 0530. She denies vaginal bleeding. She endorses contractions. She reports decreased fetal movement for the last week.    O: BP 115/69   Pulse 75   Temp 98 F (36.7 C) (Oral)   Resp 16   Ht 5\' 3"  (1.6 m)   Wt 64.3 kg   LMP 07/15/2021 (Exact Date)   SpO2 99%   BMI 25.10 kg/m  GENERAL: Well-developed, well-nourished female in no acute distress.  HEAD: Normocephalic, atraumatic.  CHEST: Normal effort of breathing, regular heart rate ABDOMEN: Soft, nontender, gravid PELVIC: Normal external female genitalia. Vagina is pink and rugated. Cervix with normal contour, no lesions. Normal discharge.  No pooling.   Cervical exam:  Dilation: Closed Effacement (%): Thick Exam by:: K.Wilosn,RN   Fetal Monitoring: Baseline: 130 Variability: moderate Accelerations: 15x15 Decelerations: none Contractions: occasional uc's  Results for orders placed or performed during the hospital encounter of 04/15/22 (from the past 24 hour(s))  POCT fern test     Status: Normal   Collection Time: 04/15/22  2:00 PM  Result Value Ref Range   POCT Fern Test Negative = intact amniotic membranes   Amnisure rupture of membrane (rom)not at [facility]     Status: None   Collection Time: 04/15/22  2:24 PM  Result Value Ref Range   Amnisure ROM NEGATIVE      A: SIUP at [redacted]w[redacted]d  Membranes intact  Discsused with atpient decreased fetal movement x1 week. Patient reports movement in MAU is decreased. Recommended patient stay for IOL due to decreased fetal movement. Patient agreeable to plan of care. Reviewed with patient that decreased fetal movement requires continuous monitoring therefore she has now risked out about waterbirth. Patient verbalized understanding and her primary goal is to not be on her back  P: Admit to  labor and delivery Report called to Dr. Clement Husbands  Wende Mott, CNM 04/15/2022 1:48 PM

## 2022-04-15 NOTE — MAU Note (Signed)
Jill Patel is a 26 y.o. at [redacted]w[redacted]d here in MAU reporting: been having lower stomach pain and back, started last night.  Started leaking some watery fluid around 0530, coming on and off. No bleeding Some nausea. Onset of complaint: last night Pain score: moderate Vitals:   04/15/22 1256  BP: 111/66  Pulse: 76  Resp: 18  Temp: 97.9 F (36.6 C)  SpO2: 99%     FHT:+FM, not as much as usual, fitted dress on. Lab orders placed from triage:  fern
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
## 2022-04-16 ENCOUNTER — Inpatient Hospital Stay (HOSPITAL_COMMUNITY): Payer: BC Managed Care – PPO | Admitting: Anesthesiology

## 2022-04-16 ENCOUNTER — Encounter (HOSPITAL_COMMUNITY): Payer: Self-pay | Admitting: Obstetrics and Gynecology

## 2022-04-16 ENCOUNTER — Encounter: Payer: BC Managed Care – PPO | Admitting: Advanced Practice Midwife

## 2022-04-16 DIAGNOSIS — J45909 Unspecified asthma, uncomplicated: Secondary | ICD-10-CM | POA: Diagnosis not present

## 2022-04-16 DIAGNOSIS — O9952 Diseases of the respiratory system complicating childbirth: Secondary | ICD-10-CM | POA: Diagnosis not present

## 2022-04-16 DIAGNOSIS — O9912 Other diseases of the blood and blood-forming organs and certain disorders involving the immune mechanism complicating childbirth: Secondary | ICD-10-CM | POA: Diagnosis not present

## 2022-04-16 DIAGNOSIS — Z3A39 39 weeks gestation of pregnancy: Secondary | ICD-10-CM | POA: Diagnosis not present

## 2022-04-16 DIAGNOSIS — O368130 Decreased fetal movements, third trimester, not applicable or unspecified: Secondary | ICD-10-CM | POA: Diagnosis not present

## 2022-04-16 LAB — CBC WITH DIFFERENTIAL/PLATELET
Abs Immature Granulocytes: 0.08 10*3/uL — ABNORMAL HIGH (ref 0.00–0.07)
Basophils Absolute: 0 10*3/uL (ref 0.0–0.1)
Basophils Relative: 0 %
Eosinophils Absolute: 0 10*3/uL (ref 0.0–0.5)
Eosinophils Relative: 0 %
HCT: 33.4 % — ABNORMAL LOW (ref 36.0–46.0)
Hemoglobin: 11.5 g/dL — ABNORMAL LOW (ref 12.0–15.0)
Immature Granulocytes: 1 %
Lymphocytes Relative: 9 %
Lymphs Abs: 1 10*3/uL (ref 0.7–4.0)
MCH: 30.4 pg (ref 26.0–34.0)
MCHC: 34.4 g/dL (ref 30.0–36.0)
MCV: 88.4 fL (ref 80.0–100.0)
Monocytes Absolute: 0.4 10*3/uL (ref 0.1–1.0)
Monocytes Relative: 3 %
Neutro Abs: 10.4 10*3/uL — ABNORMAL HIGH (ref 1.7–7.7)
Neutrophils Relative %: 87 %
Platelets: 114 10*3/uL — ABNORMAL LOW (ref 150–400)
RBC: 3.78 MIL/uL — ABNORMAL LOW (ref 3.87–5.11)
RDW: 13.6 % (ref 11.5–15.5)
WBC: 11.9 10*3/uL — ABNORMAL HIGH (ref 4.0–10.5)
nRBC: 0 % (ref 0.0–0.2)

## 2022-04-16 LAB — CBC
HCT: 35.8 % — ABNORMAL LOW (ref 36.0–46.0)
HCT: 33.6 % — ABNORMAL LOW (ref 36.0–46.0)
Hemoglobin: 11.7 g/dL — ABNORMAL LOW (ref 12.0–15.0)
Hemoglobin: 11 g/dL — ABNORMAL LOW (ref 12.0–15.0)
MCH: 30.6 pg (ref 26.0–34.0)
MCH: 29.9 pg (ref 26.0–34.0)
MCHC: 32.7 g/dL (ref 30.0–36.0)
MCHC: 32.7 g/dL (ref 30.0–36.0)
MCV: 93.7 fL (ref 80.0–100.0)
MCV: 91.3 fL (ref 80.0–100.0)
Platelets: 115 10*3/uL — ABNORMAL LOW (ref 150–400)
Platelets: 97 10*3/uL — ABNORMAL LOW (ref 150–400)
RBC: 3.82 MIL/uL — ABNORMAL LOW (ref 3.87–5.11)
RBC: 3.68 MIL/uL — ABNORMAL LOW (ref 3.87–5.11)
RDW: 14.2 % (ref 11.5–15.5)
RDW: 13.6 % (ref 11.5–15.5)
WBC: 7.9 10*3/uL (ref 4.0–10.5)
WBC: 15.8 10*3/uL — ABNORMAL HIGH (ref 4.0–10.5)
nRBC: 0 % (ref 0.0–0.2)
nRBC: 0 % (ref 0.0–0.2)

## 2022-04-16 MED ORDER — ONDANSETRON HCL 4 MG PO TABS: 4.0000 mg | ORAL_TABLET | ORAL | Status: DC | PRN

## 2022-04-16 MED ORDER — DIBUCAINE (PERIANAL) 1 % EX OINT: 1.0000 | TOPICAL_OINTMENT | CUTANEOUS | Status: DC | PRN

## 2022-04-16 MED ORDER — ZOLPIDEM TARTRATE 5 MG PO TABS: 5.0000 mg | ORAL_TABLET | Freq: Every evening | ORAL | Status: DC | PRN

## 2022-04-16 MED ORDER — LIDOCAINE HCL (PF) 1 % IJ SOLN
INTRAMUSCULAR | Status: DC | PRN
  Administered 2022-04-16: 6 mL via EPIDURAL
  Administered 2022-04-16: 4 mL via EPIDURAL

## 2022-04-16 MED ORDER — IBUPROFEN 600 MG PO TABS
600.0000 mg | ORAL_TABLET | Freq: Four times a day (QID) | ORAL | Status: DC
  Administered 2022-04-16 – 2022-04-18 (×5): 600 mg via ORAL
  Filled 2022-04-16 (×6): qty 1

## 2022-04-16 MED ORDER — COCONUT OIL OIL: 1.0000 | TOPICAL_OIL | Status: DC | PRN

## 2022-04-16 MED ORDER — EPHEDRINE 5 MG/ML INJ: 10.0000 mg | INTRAVENOUS | Status: DC | PRN

## 2022-04-16 MED ORDER — PHENYLEPHRINE 80 MCG/ML (10ML) SYRINGE FOR IV PUSH (FOR BLOOD PRESSURE SUPPORT): 80.0000 ug | PREFILLED_SYRINGE | INTRAVENOUS | Status: DC | PRN

## 2022-04-16 MED ORDER — SENNOSIDES-DOCUSATE SODIUM 8.6-50 MG PO TABS
2.0000 | ORAL_TABLET | Freq: Every day | ORAL | Status: DC
  Administered 2022-04-18: 2 via ORAL
  Filled 2022-04-16: qty 2

## 2022-04-16 MED ORDER — OXYTOCIN-SODIUM CHLORIDE 30-0.9 UT/500ML-% IV SOLN
1.0000 m[IU]/min | INTRAVENOUS | Status: DC
  Administered 2022-04-16: 2 m[IU]/min via INTRAVENOUS
  Filled 2022-04-16: qty 500

## 2022-04-16 MED ORDER — FENTANYL-BUPIVACAINE-NACL 0.5-0.125-0.9 MG/250ML-% EP SOLN
12.0000 mL/h | EPIDURAL | Status: DC | PRN
  Administered 2022-04-16: 12 mL/h via EPIDURAL
  Filled 2022-04-16: qty 250

## 2022-04-16 MED ORDER — WITCH HAZEL-GLYCERIN EX PADS: 1.0000 | MEDICATED_PAD | CUTANEOUS | Status: DC | PRN

## 2022-04-16 MED ORDER — TETANUS-DIPHTH-ACELL PERTUSSIS 5-2.5-18.5 LF-MCG/0.5 IM SUSY: 0.5000 mL | PREFILLED_SYRINGE | Freq: Once | INTRAMUSCULAR | Status: DC

## 2022-04-16 MED ORDER — PRENATAL MULTIVITAMIN CH
1.0000 | ORAL_TABLET | Freq: Every day | ORAL | Status: DC
  Administered 2022-04-17: 1 via ORAL
  Filled 2022-04-16: qty 1

## 2022-04-16 MED ORDER — DIPHENHYDRAMINE HCL 25 MG PO CAPS: 25.0000 mg | ORAL_CAPSULE | Freq: Four times a day (QID) | ORAL | Status: DC | PRN

## 2022-04-16 MED ORDER — LACTATED RINGERS IV SOLN
500.0000 mL | Freq: Once | INTRAVENOUS | Status: AC
  Administered 2022-04-16: 500 mL via INTRAVENOUS

## 2022-04-16 MED ORDER — BENZOCAINE-MENTHOL 20-0.5 % EX AERO: 1.0000 | INHALATION_SPRAY | CUTANEOUS | Status: DC | PRN

## 2022-04-16 MED ORDER — DIPHENHYDRAMINE HCL 50 MG/ML IJ SOLN: 12.5000 mg | INTRAMUSCULAR | Status: DC | PRN

## 2022-04-16 MED ORDER — ONDANSETRON HCL 4 MG/2ML IJ SOLN: 4.0000 mg | INTRAMUSCULAR | Status: DC | PRN

## 2022-04-16 MED ORDER — SIMETHICONE 80 MG PO CHEW: 80.0000 mg | CHEWABLE_TABLET | ORAL | Status: DC | PRN

## 2022-04-16 MED ORDER — ACETAMINOPHEN 325 MG PO TABS: 650.0000 mg | ORAL_TABLET | ORAL | Status: DC | PRN

## 2022-04-16 NOTE — Lactation Note (Signed)
This note was copied from a baby's chart. Lactation Consultation Note  Patient Name: Jill Patel Today's Date: 04/16/2022 Reason for consult: L&D Initial assessment;Primapara;Term Age:24 hours  Birthing parent with compressible/flexible areola. Using the teacup hold, infant latched with ease. Birthing parent comfortable with latch and without questions at this time.  Maternal Data Has patient been taught Hand Expression?: No Does the patient have breastfeeding experience prior to this delivery?: No  Feeding Mother's Current Feeding Choice: Breast Milk  LATCH Score Latch: Grasps breast easily, tongue down, lips flanged, rhythmical sucking. (frequent pauses, but appropriate for being recently born)  Audible Swallowing: None  Type of Nipple: Everted at rest and after stimulation  Comfort (Breast/Nipple): Soft / non-tender  Hold (Positioning): Assistance needed to correctly position infant at breast and maintain latch.  LATCH Score: 7   Lactation Tools Discussed/Used    Interventions    Discharge [place] [facility]: Yes  Consult Status Consult Status: Follow-up from L&D    Jill Patel [facility] 04/16/2022, 7:28 PM

## 2022-04-16 NOTE — Discharge Summary (Addendum)
Postpartum Discharge Summary  Date of Service updated 04/18/22    Patient Name: Jill Patel DOB: [birth year] MRN: [id number]  Date of admission: 04/15/2022 Delivery date:04/16/2022  Delivering provider: Starr Lake  Date of discharge: 04/18/2022  Admitting diagnosis: Normal labor [O80, Z37.9] Intrauterine pregnancy: [redacted]w[redacted]d    Secondary diagnosis:  Principal Problem:   Vaginal delivery Active Problems:   Supervision of other normal pregnancy, antepartum   Alpha thalassemia silent carrier   Echogenic intracardiac focus of fetus on prenatal ultrasound   Anemia of pregnancy   [place] affecting [place], [place]  Additional problems: n/a    Discharge diagnosis: Term Pregnancy Delivered                                              Post partum procedures: NA Augmentation: AROM, Pitocin, and Cytotec Complications: None  Hospital course: Induction of Labor With Vaginal Delivery   26y.o. yo G4P0030 at 350w2das admitted to the hospital 04/15/2022 for induction of labor.  Indication for induction:  decreased fetal movements .  Patient had an uncomplicated labor course as follows: Membrane Rupture Time/Date: 1:08 PM ,04/16/2022   Delivery Method:Vaginal, Spontaneous  Episiotomy: None  Lacerations:  None  Details of delivery can be found in separate delivery note.  Patient had a routine postpartum course. Patient is discharged home 04/16/22.  Newborn Data: Birth date:04/16/2022  Birth time:5:18 PM  Gender:Female  Living status:Living  Apgars:9 ,9  Weight:   Magnesium Sulfate received: No BMZ received: No Rhophylac:No MMR:N/A T-DaP: declined Flu: declined Transfusion:No  Physical exam  Vitals:   04/16/22 1601 04/16/22 1740 04/16/22 1745 04/16/22 1805  BP: 115/81 129/83 (!) 145/104 131/81  Pulse: (!) 108 (!) 115 (!) 122 (!) 109  Resp: 18 16 16 18   Temp:      TempSrc:      SpO2:      Weight:      Height:       General: alert,  cooperative, and no distress Lochia: appropriate Uterine Fundus: firm Incision: N/A DVT Evaluation: No evidence of DVT seen on physical exam. Labs: Lab Results  Component Value Date   WBC 11.9 (H) 04/16/2022   HGB 11.5 (L) 04/16/2022   HCT 33.4 (L) 04/16/2022   MCV 88.4 04/16/2022   PLT 114 (L) 04/16/2022      Latest Ref Rng & Units 09/13/2021   10:25 AM  CMP  Glucose 70 - 99 mg/dL 75   BUN 6 - 20 mg/dL 5   Creatinine 0.44 - 1.00 mg/dL 0.60   Sodium 135 - 145 mmol/L 133   Potassium 3.5 - 5.1 mmol/L 3.4   Chloride 98 - 111 mmol/L 102   CO2 22 - 32 mmol/L 23   Calcium 8.9 - 10.3 mg/dL 9.1   Total Protein 6.5 - 8.1 g/dL 6.5   Total Bilirubin 0.3 - 1.2 mg/dL 0.4   Alkaline Phos 38 - 126 U/L 57   AST 15 - 41 U/L 19   ALT 0 - 44 U/L 16    Edinburgh Score:     No data to display           After visit meds:  Allergies as of 04/18/2022       Reactions   Kiwi Extract Anaphylaxis   Aleve [naproxen Sodium]    Stomach bleeds   Mango Flavor  Scratchy throat   Other    Eggplant - scratchy throat    Pineapple    Scratchy throat         Medication List     STOP taking these medications    multivitamin-prenatal 27-0.8 MG Tabs tablet       TAKE these medications    acetaminophen 325 MG tablet Commonly known as: Tylenol Take 2 tablets (650 mg total) by mouth every 4 (four) hours as needed (for pain scale < 4).   benzocaine-Menthol 20-0.5 % Aero Commonly known as: DERMOPLAST Apply 1 Application topically as needed for irritation (perineal discomfort).   ibuprofen 600 MG tablet Commonly known as: ADVIL Take 1 tablet (600 mg total) by mouth every 6 (six) hours.   Iron (Ferrous Sulfate) 325 (65 Fe) MG Tabs Take 1 tablet by mouth every other day.   senna-docusate 8.6-50 MG tablet Commonly known as: Senokot-S Take 2 tablets by mouth daily.         Discharge home in stable condition Infant Feeding: Bottle and Breast Infant Disposition:home with  mother Discharge instruction: per After Visit Summary and Postpartum booklet. Activity: Advance as tolerated. Pelvic rest for 6 weeks.  Diet: routine diet Future Appointments:No future appointments. Follow up Visit:   PP Message sent by KK Please schedule this patient for a In person postpartum visit in 6 weeks with the following provider: Any provider. Additional Postpartum F/U: NA   Low risk pregnancy complicated by:  Nothing Delivery mode:  Vaginal, Spontaneous  Anticipated Birth Control:   none   04/16/2022 Starr Lake, CNM

## 2022-04-16 NOTE — Progress Notes (Signed)
Patient ID: Jill Patel, female   DOB: [birth year], 26 y.o.   MRN: [id number]  Subjective: -Patient in bed breathing through contractions.  FOB and other family at bedside, supportive.  Patient states she had dose of fentanyl with minimal relief of pain. Questions when she can get in the waterbirth tub.   Objective: BP 126/79   Pulse 88   Temp 98 F (36.7 C) (Oral)   Resp 17   Ht 5\' 3"  (1.6 m)   Wt 64.3 kg   LMP 07/15/2021 (Exact Date)   SpO2 99%   BMI 25.10 kg/m  No intake/output data recorded. No intake/output data recorded.  Fetal Monitoring: FHT: 130 bpm, Mod Var, -Decels, +Accels UC: Q5-2min, Coupling Noted    Vaginal Exam: SVE:   Dilation: 2 Effacement (%): 50 Station: -2 Exam by:: Facilities manager Membranes:Intact Internal Monitors: None  Augmentation/Induction: Pitocin:None Cytotec: S/P One Dose  Assessment:  IUP at 39.2 weeks Cat I FT  IOL  Plan: -Discussed IOL process and how it is difficult to conclude appropriateness of water immersion at certain times because discontinuation of some interventions can cause labor to stall. -Informed that usually, in cases of spontaneous onset of labor, patient would get in the waterbirth tub at 4cm.  However, in this case she may require additional interventions, such as pitocin, that would risk her out from [place] and/or usage of Nitrous for pain management. -SO reports patient has tried hydrotherapy with some relief.  -Patient informed that her candidacy for waterbirth will be reviewed as appropriate.   -L&D provider states he will proceed with placement of foley bulb and cytotec for continuation of induction process.    Riley Churches, CNM Advanced Practice Provider, Center for [facility] 04/16/2022, 3:24 AM

## 2022-04-16 NOTE — Anesthesia Preprocedure Evaluation (Signed)
Anesthesia Evaluation  Patient identified by MRN, date of birth, ID band Patient awake    Reviewed: Allergy & Precautions, H&P , NPO status , Patient's Chart, lab work & pertinent test results  History of Anesthesia Complications Negative for: history of anesthetic complications  Airway Mallampati: II  TM Distance: >3 FB     Dental   Pulmonary asthma , former smoker,    Pulmonary exam normal        Cardiovascular negative cardio ROS   Rhythm:regular Rate:Normal     Neuro/Psych negative neurological ROS  negative psych ROS   GI/Hepatic negative GI ROS, Neg liver ROS,   Endo/Other  negative endocrine ROS  Renal/GU negative Renal ROS  negative genitourinary   Musculoskeletal   Abdominal   Peds  Hematology negative hematology ROS (+)   Anesthesia Other Findings   Reproductive/Obstetrics (+) Pregnancy                             Anesthesia Physical Anesthesia Plan  ASA: 2  Anesthesia Plan: Epidural   Post-op Pain Management:    Induction:   PONV Risk Score and Plan:   Airway Management Planned:   Additional Equipment:   Intra-op Plan:   Post-operative Plan:   Informed Consent: I have reviewed the patients History and Physical, chart, labs and discussed the procedure including the risks, benefits and alternatives for the proposed anesthesia with the patient or authorized representative who has indicated his/her understanding and acceptance.       Plan Discussed with:   Anesthesia Plan Comments:         Anesthesia Quick Evaluation

## 2022-04-16 NOTE — Anesthesia Procedure Notes (Signed)
Epidural Patient location during procedure: OB Start time: 04/16/2022 6:11 AM End time: 04/16/2022 6:23 AM  Staffing Anesthesiologist: Lidia Collum, MD Performed: anesthesiologist   Preanesthetic Checklist Completed: patient identified, IV checked, risks and benefits discussed, monitors and equipment checked, pre-op evaluation and timeout performed  Epidural Patient position: sitting Prep: DuraPrep Patient monitoring: heart rate, continuous pulse ox and blood pressure Approach: midline Location: L3-L4 Injection technique: LOR air  Needle:  Needle type: Tuohy  Needle gauge: 17 G Needle length: 9 cm Needle insertion depth: 6 cm Catheter type: closed end flexible Catheter size: 19 Gauge Catheter at skin depth: 11 cm Test dose: negative  Assessment Events: blood not aspirated, injection not painful, no injection resistance, no paresthesia and negative IV test  Additional Notes Reason for block:procedure for pain

## 2022-04-16 NOTE — Progress Notes (Signed)
   Jill Patel is a 26 y.o. G4P0030 at [redacted]w[redacted]d  admitted for IOL for decreased fetal movements  Subjective: Patient doing well, resting with epidural  Objective: Vitals:   04/16/22 1231 04/16/22 1301 04/16/22 1331 04/16/22 1401  BP: 125/70 118/77 119/78 105/64  Pulse: 96 (!) 101 94 94  Resp: 16 15 16 15   Temp:    98.4 F (36.9 C)  TempSrc:    Oral  SpO2:      Weight:      Height:       No intake/output data recorded.  FHT:  FHR: 125 bpm, variability: moderate,  accelerations:  Present,  decelerations:  Present occasional lates, variables and early decelerations UC:   irregular, every 1-5 minutes SVE:   Dilation: 8 Effacement (%): 70 Station: -1 Exam by:: K. Aizik Reh CNM Pitocin @ 10 mu/min  Labs: Lab Results  Component Value Date   WBC 11.9 (H) 04/16/2022   HGB 11.5 (L) 04/16/2022   HCT 33.4 (L) 04/16/2022   MCV 88.4 04/16/2022   PLT 114 (L) 04/16/2022    Assessment / Plan: AROM clear fluid, patient and fetus tolerated well -will continue to titrate pitocin and continue position changes -explained plan with family; they verbalized understanding Labor: Progressing normally Fetal Wellbeing:  Category II Pain Control:  Epidural Anticipated MOD:  NSVD  Mervyn Skeeters Endy Easterly 04/16/2022, 2:42 PM

## 2022-04-16 NOTE — Progress Notes (Signed)
   Jill Patel is a 26 y.o. G4P0030 at [redacted]w[redacted]d  admitted for IOL due to decreased fetal movements. Origincally wanted WB but is now risked out due to decreased fetal movements and now with epidural  Subjective: Doing well, resting in bed  Objective: Vitals:   04/16/22 0705 04/16/22 0731 04/16/22 0801 04/16/22 0831  BP: 106/72 107/68 95/61 (!) 87/47  Pulse: 92 86 82 79  Resp:  16 15 16   Temp:      TempSrc:      SpO2:      Weight:      Height:       No intake/output data recorded.  FHT:  FHR: 120 bpm, variability: moderate,  accelerations:  Present,  decelerations:  Absent UC:   irregular, every 2-6 minutes SVE:   Dilation: 6 Effacement (%): 70 Station: -1, -2 Exam by:: August Albino RNC Pitocin @ 0 mu/min  Labs: Lab Results  Component Value Date   WBC 11.9 (H) 04/16/2022   HGB 11.5 (L) 04/16/2022   HCT 33.4 (L) 04/16/2022   MCV 88.4 04/16/2022   PLT 114 (L) 04/16/2022    Assessment / Plan: RN will check after patient has had a nap and will consider pitocin if no change   Labor:  still in early labor Fetal Wellbeing:  Category I Pain Control:  Epidural Anticipated MOD:  NSVD  Mervyn Skeeters [name] 04/16/2022, 10:01 AM

## 2022-04-17 LAB — RPR: RPR Ser Ql: NONREACTIVE — AB

## 2022-04-17 NOTE — Lactation Note (Signed)
This note was copied from a baby's chart. Lactation Consultation Note  Patient Name: Jill Patel PXTGG'[id number] Date: 04/17/2022 Reason for consult: Follow-up assessment;Primapara;1st time breastfeeding;Term;Infant weight loss (per Birth parent and MBURN baby recently fed 60 mins. per Birth parent breastfeeding is going well. [place] encouraged birth parent to call if needing assistance.) Age:24 hours  Maternal Data    Feeding Mother's Current Feeding Choice: Breast Milk  LATCH Score ( Latch score by MBURN )  Latch: Grasps breast easily, tongue down, lips flanged, rhythmical sucking.  Audible Swallowing: A few with stimulation  Type of Nipple: Everted at rest and after stimulation  Comfort (Breast/Nipple): Soft / non-tender  Hold (Positioning): No assistance needed to correctly position infant at breast.  LATCH Score: 9   Lactation Tools Discussed/Used    Interventions Interventions: Breast feeding basics reviewed;Education  Discharge    Consult Status Consult Status: Follow-up Date: 04/18/22 Follow-up type: In-patient    [place] 04/17/2022, 6:16 PM

## 2022-04-17 NOTE — Social Work (Signed)
CSW acknowledged consult and attempted to meet with MOB. However,family at bedside. CSW will meet with MOB at a later time.  Kathrin Greathouse, MSW, LCSW Women's and [place] Worker  [phone] 04/17/2022  4:13 PM

## 2022-04-17 NOTE — Lactation Note (Signed)
This note was copied from a baby's chart. Lactation Consultation Note  Patient Name: Jill Patel Today's Date: 04/17/2022 Reason for consult: Initial assessment;Primapara;Term Age:24 hours Mom had baby on the breast when LC came into rm. Discussed feeding positions, props,support and body alignment. Mom denies painful latches.  Newborn feeding habits, behavior, STS, I&O supply and demand reviewed. Answered mom's questions. Praised mom for her good feedings so far. Encouraged to call for assistance as needed.  Maternal Data Has patient been taught Hand Expression?: Yes Does the patient have breastfeeding experience prior to this delivery?: No  Feeding    LATCH Score Latch: Grasps breast easily, tongue down, lips flanged, rhythmical sucking.  Audible Swallowing: None  Type of Nipple: Everted at rest and after stimulation  Comfort (Breast/Nipple): Soft / non-tender  Hold (Positioning): Assistance needed to correctly position infant at breast and maintain latch.  LATCH Score: 7   Lactation Tools Discussed/Used    Interventions Interventions: Breast feeding basics reviewed;Adjust position;Assisted with latch;Support pillows;Skin to skin;Position options;Breast massage;Hand express;Breast compression;LC Services brochure  Discharge    Consult Status Consult Status: Follow-up Date: 04/18/22 Follow-up type: In-patient    Theodoro Kalata 04/17/2022, 3:27 AM

## 2022-04-17 NOTE — Anesthesia Postprocedure Evaluation (Signed)
Anesthesia Post Note  Patient: Jill Patel  Procedure(s) Performed: AN AD HOC LABOR EPIDURAL     Anesthesia Post Evaluation No notable events documented.  Last Vitals:  Vitals:   04/17/22 0530 04/17/22 0825  BP: 113/61 117/63  Pulse: 94 78  Resp: 16 17  Temp: 36.7 C 36.7 C  SpO2: 100% 100%    Last Pain:  Vitals:   04/17/22 0826  TempSrc:   PainSc: 0-No pain   Pain Goal:                   Sammie Bench

## 2022-04-17 NOTE — Progress Notes (Signed)
Post Partum Day 1 Subjective: Eating, drinking, voiding, ambulating well.  +flatus.  Lochia and pain wnl.  Denies dizziness, lightheadedness, or sob. No complaints. May want to go home later this evening, wants to make sure everything is going ok with her and baby today.   Objective: Blood pressure 113/61, pulse 94, temperature 98.1 F (36.7 C), temperature source Oral, resp. rate 16, height 5\' 3"  (1.6 m), weight 64.3 kg, last menstrual period 07/15/2021, SpO2 100 %, unknown if currently breastfeeding.  Physical Exam:  General: alert, cooperative, and no distress Lochia: appropriate Uterine Fundus: firm Incision: n/a DVT Evaluation: No evidence of DVT seen on physical exam. Negative Homan's sign. No cords or calf tenderness. No significant calf/ankle edema.  Recent Labs    04/16/22 0459 04/16/22 1854  HGB 11.5* 11.0*  HCT 33.4* 33.6*    Assessment/Plan: Possible d/c this pm pending peds and pt decision, will let nurse know  Breastfeeding     LOS: 2 days   Roma Schanz, CNM 04/17/2022, 8:01 AM

## 2022-04-18 MED ORDER — IBUPROFEN 600 MG PO TABS: 600.0000 mg | ORAL_TABLET | Freq: Four times a day (QID) | ORAL | 0 refills | Status: DC

## 2022-04-18 MED ORDER — ACETAMINOPHEN 325 MG PO TABS: 650.0000 mg | ORAL_TABLET | ORAL | 0 refills | Status: DC | PRN

## 2022-04-18 MED ORDER — SENNOSIDES-DOCUSATE SODIUM 8.6-50 MG PO TABS: 2.0000 | ORAL_TABLET | Freq: Every day | ORAL | 0 refills | Status: DC

## 2022-04-18 MED ORDER — BENZOCAINE-MENTHOL 20-0.5 % EX AERO: 1.0000 | INHALATION_SPRAY | CUTANEOUS | 0 refills | Status: DC | PRN

## 2022-04-18 NOTE — Lactation Note (Signed)
This note was copied from a baby's chart. Lactation Consultation Note  Patient Name: Jill Patel XHFSF'[id number] Date: 04/18/2022 Reason for consult: Follow-up assessment Age:24 hours  P1, Baby came off breast at end of feeding. Feed on demand with cues.  Goal 8-12+ times per day after first 24 hrs.  Place baby STS if not cueing. . Reviewed engorgement care and monitoring voids/stools. Feeding Mother's Current Feeding Choice: Breast Milk  Interventions Interventions: Education  Discharge Discharge Education: Engorgement and breast care;Warning signs for feeding baby  Consult Status Consult Status: Complete Date: 04/18/22    Vivianne Master [facility] 04/18/2022, 10:31 AM

## 2022-04-18 NOTE — Social Work (Signed)
CSW received consult for hx of Anxiety and Depression. CSW met with MOB to offer support and complete assessment.    CSW met with MOB at bedside and introduced CSW role. CSW observed MOB changing the infant's diaper and FOB up in the room providing support. MOB present pleasant and welcomed CSW visit with FOB present. CSW inquired how MOB has felt since giving birth. MOB reported that she has been feeling good overall, she became slightly overwhelmed last night when baby cried for an extended period. However, with FOB support he was able to help calm the baby and provide her with emotional support. MOB expressed that FOB has been her greatest support since having the baby. MOB acknowledged that she has a history of anxiety and depression diagnosed prior to 2017 while living in [place]. MOB reported that she was prescribed medication to treat symptoms. But she does not recall the name of the medication. MOB reported she has been able to manage without treatment and aware there are resources available if needed. MOB reported that she had normal stress during the pregnancy that was "heightened with pregnancy." MOB met with Seth Bake once during the pregnancy and felt the one visit was sufficient. MOB reports she is active with [facility] community services and the first visit is Monday. CSW discussed PPD/PPA.  CSW provided education regarding the baby blues period vs. perinatal mood disorders, discussed treatment and gave resources for mental health follow up if concerns arise.  CSW recommended MOB complete a self-evaluation during the postpartum time period using the New Mom Checklist from Postpartum Progress and encouraged MOB to contact a medical professional if symptoms are noted at any time. CSW assessed MOB for safety. MOB denied thoughts of harm to self and others.   MOB reported she has all essential items to care for the infant including a bassinet where the infant will sleep. CSW provided  review of Sudden Infant Death Syndrome (SIDS) precautions. MOB has chosen [employer] for the infant's follow up care. CSW assessed MOB for additional needs. MOB reported no further need.     CSW identifies no further need for intervention and no barriers to discharge at this time.   Kathrin Greathouse, MSW, LCSW Women's and [place] Worker  [phone] 02-20-2022  12:03 PM

## 2022-04-24 ENCOUNTER — Telehealth (HOSPITAL_COMMUNITY): Payer: Self-pay | Admitting: *Deleted

## 2022-04-24 NOTE — Telephone Encounter (Signed)
Patient voiced no questions or concerns regarding her health at this time. Patient voiced no questions or concerns regarding infant at this time. Patient reports infant sleeps in a bassinet on her back. RN reviewed ABCs of safe sleep. Patient verbalized understanding. Phone call was dropped as RN began EPDS. Return call placed to patient and no answer received. RN left message instructing patient to return call with questions or concerns. Erline Levine, RN, 04/24/22, [phone]

## 2022-06-06 ENCOUNTER — Ambulatory Visit (INDEPENDENT_AMBULATORY_CARE_PROVIDER_SITE_OTHER): Payer: BC Managed Care – PPO | Admitting: Obstetrics and Gynecology

## 2022-06-06 ENCOUNTER — Encounter: Payer: Self-pay | Admitting: Obstetrics and Gynecology

## 2022-06-06 DIAGNOSIS — N912 Amenorrhea, unspecified: Secondary | ICD-10-CM

## 2022-06-06 LAB — POCT URINE PREGNANCY: Preg Test, Ur: NEGATIVE

## 2022-06-06 NOTE — Progress Notes (Signed)
Post Partum Visit Note  Jill Patel is a 26 y.o. [phone] female who presents for a postpartum visit. She is 7 weeks postpartum following a normal spontaneous vaginal delivery.  I have fully reviewed the prenatal and intrapartum course. The delivery was at 39 gestational weeks.  Anesthesia: epidural. Postpartum course has been uncomplicated. Baby is doing well. Baby is feeding by breast. Bleeding no bleeding. Bowel function is normal. Bladder function is normal. Patient is sexually active. Contraception method is none. Postpartum depression screening: negative.   The pregnancy intention screening data noted above was reviewed. Potential methods of contraception were discussed. The patient elected to proceed with No data recorded.   Edinburgh Postnatal Depression Scale - 06/06/22 1605       Edinburgh Postnatal Depression Scale:  In the Past 7 Days   I have been able to laugh and see the funny side of things. 0    I have looked forward with enjoyment to things. 0    I have blamed myself unnecessarily when things went wrong. 0    I have been anxious or worried for no good reason. 1    I have felt scared or panicky for no good reason. 0    Things have been getting on top of me. 0    I have been so unhappy that I have had difficulty sleeping. 0    I have felt sad or miserable. 1    I have been so unhappy that I have been crying. 1    The thought of harming myself has occurred to me. 0    Edinburgh Postnatal Depression Scale Total 3             Health Maintenance Due  Topic Date Due   COVID-19 Vaccine (1) Never done   HPV VACCINES (1 - 2-dose series) Never done   INFLUENZA VACCINE  Never done    The following portions of the patient's history were reviewed and updated as appropriate: allergies, current medications, past family history, past medical history, past social history, past surgical history, and problem list.  Review of Systems Constitutional: negative Eyes:  negative Ears, nose, mouth, throat, and face: negative Respiratory: negative Cardiovascular: negative Gastrointestinal: negative Genitourinary:negative Integument/breast: negative Hematologic/lymphatic: negative Musculoskeletal:negative Neurological: negative Behavioral/Psych: negative Endocrine: negative Allergic/Immunologic: negative  Objective:  BP (!) 104/52   Pulse 75   Ht 5\' 3"  (1.6 m)   Wt 121 lb 3.2 oz (55 kg)   LMP 07/15/2021 (Exact Date)   Breastfeeding Yes   BMI 21.47 kg/m    General:  alert, cooperative, appears stated age, and fatigued   Breasts:  not indicated  Lungs: Normal effort  Heart:  Normal rate  Abdomen: deferred    Wound N/a  GU exam:  not indicated       Assessment:   Encounter for postpartum care of lactating mother  - POCT urine pregnancy  - Normal postpartum exam.   Plan:   Essential components of care per ACOG recommendations:  1.  Mood and well being: Patient with negative depression screening today. Reviewed local resources for support.  - Patient tobacco use? No.   - hx of drug use? No.    2. Infant care and feeding:  -Patient currently breastmilk feeding? Yes -Social determinants of health (SDOH) reviewed in EPIC. No concerns   3. Sexuality, contraception and birth spacing - Patient does not want a pregnancy in the next year.  Desired family size is 1 children.  - Reviewed  reproductive life planning. Reviewed contraceptive methods based on pt preferences and effectiveness.  Patient desired Female Condom today.   - Discussed birth spacing of 18 months  4. Sleep and fatigue -Encouraged family/partner/community support of 4 hrs of uninterrupted sleep to help with mood and fatigue  5. Physical Recovery  - Discussed patients delivery and complications. She describes her labor as good. - Patient had a Vaginal, no problems at delivery. Patient had a  small LT side wall laceration - no repair needed . Perineal healing reviewed. Patient  expressed understanding - Patient has urinary incontinence? No. - Patient is safe to resume physical and sexual activity  6.  Health Maintenance - HM due items addressed Yes - Last pap smear  Diagnosis  Date Value Ref Range Status  10/03/2021   Final   - Negative for Intraepithelial Lesions or Malignancy (NILM)  10/03/2021 - Benign reactive/reparative changes  Final   Pap smear not done at today's visit.  -Breast Cancer screening indicated? No.   7. Chronic Disease/Pregnancy Condition follow up: None  - PCP follow up  Raelyn Mora, CNM Center for [employer], [facility]

## 2022-06-06 NOTE — Progress Notes (Unsigned)
Patient states that she has had intercourse 3-4 times since delivery with the first time being at 3 weeks postpartum. Declines birth control at this time.

## 2022-06-09 ENCOUNTER — Encounter: Payer: Self-pay | Admitting: Obstetrics and Gynecology

## 2022-06-27 ENCOUNTER — Encounter: Payer: Self-pay | Admitting: *Deleted

## 2022-07-17 ENCOUNTER — Emergency Department (HOSPITAL_COMMUNITY): Payer: BC Managed Care – PPO

## 2022-07-17 ENCOUNTER — Other Ambulatory Visit: Payer: Self-pay

## 2022-07-17 ENCOUNTER — Encounter (HOSPITAL_COMMUNITY): Payer: Self-pay | Admitting: Emergency Medicine

## 2022-07-17 ENCOUNTER — Emergency Department (HOSPITAL_COMMUNITY)
Admission: EM | Admit: 2022-07-17 | Discharge: 2022-07-17 | Disposition: A | Discharge status: Home / Self Care | Payer: BC Managed Care – PPO | Attending: Emergency Medicine | Admitting: Emergency Medicine

## 2022-07-17 DIAGNOSIS — N200 Calculus of kidney: Secondary | ICD-10-CM

## 2022-07-17 DIAGNOSIS — N132 Hydronephrosis with renal and ureteral calculous obstruction: Secondary | ICD-10-CM | POA: Diagnosis not present

## 2022-07-17 DIAGNOSIS — R1032 Left lower quadrant pain: Secondary | ICD-10-CM | POA: Diagnosis present

## 2022-07-17 LAB — COMPREHENSIVE METABOLIC PANEL
ALT: 13 U/L (ref 0–44)
AST: 21 U/L (ref 15–41)
Albumin: 4.4 g/dL (ref 3.5–5.0)
Alkaline Phosphatase: 75 U/L (ref 38–126)
Anion gap: 9 (ref 5–15)
BUN: 13 mg/dL (ref 6–20)
CO2: 23 mmol/L (ref 22–32)
Calcium: 9.7 mg/dL (ref 8.9–10.3)
Chloride: 109 mmol/L (ref 98–111)
Creatinine, Ser: 0.83 mg/dL (ref 0.44–1.00)
GFR, Estimated: >60 mL/min (ref >60)
Glucose, Bld: 95 mg/dL (ref 70–99)
Potassium: 3.5 mmol/L (ref 3.5–5.1)
Sodium: 141 mmol/L (ref 135–145)
Total Bilirubin: 0.5 mg/dL (ref 0.3–1.2)
Total Protein: 7.2 g/dL (ref 6.5–8.1)

## 2022-07-17 LAB — URINALYSIS, ROUTINE W REFLEX MICROSCOPIC
Bilirubin Urine: NEGATIVE
Glucose, UA: NEGATIVE mg/dL
Ketones, ur: NEGATIVE mg/dL
Nitrite: NEGATIVE
Protein, ur: NEGATIVE mg/dL
RBC / HPF: >50 RBC/hpf — ABNORMAL HIGH (ref 0–5)
Specific Gravity, Urine: 1.016 (ref 1.005–1.030)
pH: 6 (ref 5.0–8.0)

## 2022-07-17 LAB — CBC
HCT: 36 % (ref 36.0–46.0)
Hemoglobin: 12 g/dL (ref 12.0–15.0)
MCH: 30.1 pg (ref 26.0–34.0)
MCHC: 33.3 g/dL (ref 30.0–36.0)
MCV: 90.2 fL (ref 80.0–100.0)
Platelets: 204 10*3/uL (ref 150–400)
RBC: 3.99 MIL/uL (ref 3.87–5.11)
RDW: 12.7 % (ref 11.5–15.5)
WBC: 8.2 10*3/uL (ref 4.0–10.5)
nRBC: 0 % (ref 0.0–0.2)

## 2022-07-17 LAB — LIPASE, BLOOD: Lipase: 44 U/L (ref 11–51)

## 2022-07-17 LAB — I-STAT BETA HCG BLOOD, ED (MC, WL, AP ONLY): I-stat hCG, quantitative: <5 m[IU]/mL (ref <5)

## 2022-07-17 MED ORDER — IBUPROFEN 200 MG PO TABS
600.0000 mg | ORAL_TABLET | Freq: Once | ORAL | Status: AC
  Administered 2022-07-17: 600 mg via ORAL
  Filled 2022-07-17: qty 3

## 2022-07-17 MED ORDER — DIPHENHYDRAMINE HCL 50 MG/ML IJ SOLN
25.0000 mg | Freq: Once | INTRAMUSCULAR | Status: AC
  Administered 2022-07-17: 25 mg via INTRAVENOUS
  Filled 2022-07-17: qty 1

## 2022-07-17 MED ORDER — MORPHINE SULFATE (PF) 4 MG/ML IV SOLN
4.0000 mg | Freq: Once | INTRAVENOUS | Status: AC
  Administered 2022-07-17: 4 mg via INTRAVENOUS
  Filled 2022-07-17: qty 1

## 2022-07-17 MED ORDER — METOCLOPRAMIDE HCL 5 MG/ML IJ SOLN
10.0000 mg | INTRAMUSCULAR | Status: AC
  Administered 2022-07-17: 10 mg via INTRAVENOUS
  Filled 2022-07-17: qty 2

## 2022-07-17 MED ORDER — IOHEXOL 300 MG/ML  SOLN
100.0000 mL | Freq: Once | INTRAMUSCULAR | Status: AC | PRN
  Administered 2022-07-17: 80 mL via INTRAVENOUS

## 2022-07-17 MED ORDER — SODIUM CHLORIDE (PF) 0.9 % IJ SOLN
INTRAMUSCULAR | Status: AC
  Filled 2022-07-17: qty 50

## 2022-07-17 MED ORDER — TAMSULOSIN HCL 0.4 MG PO CAPS: 0.4000 mg | ORAL_CAPSULE | Freq: Every day | ORAL | 0 refills | Status: DC

## 2022-07-17 MED ORDER — OXYCODONE HCL 5 MG PO TABS: 5.0000 mg | ORAL_TABLET | ORAL | 0 refills | Status: DC | PRN

## 2022-07-17 NOTE — Discharge Instructions (Signed)
You were seen for your kidney stone in the emergency department.   At home, please take Tylenol, ibuprofen, and oxycodone for any breakthrough pain.  Please take the Flomax as needed every day until your kidney stone passes.  While you are on the Flomax and oxycodone you will need to hold off on breast-feeding and use formula instead.  Please do this for up to 48 hours after you stop taking those medications.  Check your MyChart online for the results of any tests that had not resulted by the time you left the emergency department.   Follow-up with your primary doctor in 2-3 days regarding your visit.  If you do not have a primary doctor you may follow-up with [facility].  Follow-up with urology in a week and use the urine strainer for your kidney stone.  Return immediately to the emergency department if you experience any of the following: Fevers, chills, or any other concerning symptoms.    Thank you for visiting our Emergency Department. It was a pleasure taking care of you today.

## 2022-07-17 NOTE — ED Provider Notes (Signed)
[facility]-EMERGENCY DEPT Provider Note   CSN: [id number] Arrival date & time: 07/17/22  8295     History  Chief Complaint  Patient presents with   Abdominal Pain    Jill Patel is a 26 y.o. female.  26 yo F with hx of ovarian cysts who presents to the emergency department with LLQ pain.  His last night she started having nausea and this morning had 4 episodes of vomiting that was nonbloody nonbilious.  Had intermittent left lower quadrant abdominal pain that started to become constant.  Worse with movement.  Unsure if it changes with eating or defecation.  Also had several loose stools this morning.  Denies any vaginal discharge or bleeding recently.  Says that she is currently breast-feeding and did have a recent pregnancy where she had a chlamydial infection that was treated.  Says she had an ovarian cyst previously that felt similar.  No history of abdominal surgeries.  Denies urinary frequency or urgency or dysuria.  Has had chills but no fevers.         Home Medications Prior to Admission medications   Medication Sig Start Date End Date Taking? Authorizing Provider  oxyCODONE (ROXICODONE) 5 MG immediate release tablet Take 1 tablet (5 mg total) by mouth every 4 (four) hours as needed for severe pain. 07/17/22  Yes Rondel Baton, MD  tamsulosin (FLOMAX) 0.4 MG CAPS capsule Take 1 capsule (0.4 mg total) by mouth daily. 07/17/22  Yes Rondel Baton, MD  acetaminophen (TYLENOL) 325 MG tablet Take 2 tablets (650 mg total) by mouth every 4 (four) hours as needed (for pain scale < 4). 04/18/22   Mercado-Ortiz, Lahoma Crocker, DO  benzocaine-Menthol (DERMOPLAST) 20-0.5 % AERO Apply 1 Application topically as needed for irritation (perineal discomfort). Patient not taking: Reported on 06/06/2022 04/18/22   Mercado-Ortiz, Lahoma Crocker, DO  ibuprofen (ADVIL) 600 MG tablet Take 1 tablet (600 mg total) by mouth every 6 (six) hours. Patient not taking: Reported on  06/06/2022 04/18/22   Mercado-Ortiz, Lahoma Crocker, DO  Iron, Ferrous Sulfate, 325 (65 Fe) MG TABS Take 1 tablet by mouth every other day. Patient not taking: Reported on 02/27/2022 01/29/22   Warner Mccreedy, MD  senna-docusate (SENOKOT-S) 8.6-50 MG tablet Take 2 tablets by mouth daily. Patient not taking: Reported on 06/06/2022 04/18/22   Myrtie Hawk, DO      Allergies    Kiwi extract, Aleve [naproxen sodium], Mango flavor, Other, and Pineapple    Review of Systems   Review of Systems  Physical Exam Updated Vital Signs BP 118/78   Pulse 80   Temp 97.8 F (36.6 C)   Resp 15   Ht 5\' 3"  (1.6 m)   Wt 54 kg   LMP 07/17/2021 (Approximate)   SpO2 100%   Breastfeeding Yes   BMI 21.08 kg/m  Physical Exam Vitals and nursing note reviewed.  Constitutional:      General: She is not in acute distress.    Appearance: She is well-developed.     Comments: Uncomfortable appearing.  HENT:     Head: Normocephalic and atraumatic.     Right Ear: External ear normal.     Left Ear: External ear normal.     Nose: Nose normal.  Eyes:     Extraocular Movements: Extraocular movements intact.     Conjunctiva/sclera: Conjunctivae normal.     Pupils: Pupils are equal, round, and reactive to light.  Cardiovascular:     Rate and Rhythm: Normal  rate and regular rhythm.  Pulmonary:     Effort: Pulmonary effort is normal. No respiratory distress.  Abdominal:     General: Abdomen is flat. There is no distension.     Palpations: Abdomen is soft. There is no mass.     Tenderness: There is abdominal tenderness (Left lower quadrant). There is left CVA tenderness. There is no right CVA tenderness or guarding.  Musculoskeletal:     Cervical back: Normal range of motion and neck supple.     Right lower leg: No edema.     Left lower leg: No edema.  Skin:    General: Skin is warm and dry.  Neurological:     Mental Status: She is alert and oriented to person, place, and time. Mental status is at  baseline.  Psychiatric:        Mood and Affect: Mood normal.     ED Results / Procedures / Treatments   Labs (all labs ordered are listed, but only abnormal results are displayed) Labs Reviewed  URINE CULTURE - Abnormal; Notable for the following components:      Result Value   Culture 80,000 COLONIES/mL STAPHYLOCOCCUS SAPROPHYTICUS (*)    Organism ID, Bacteria STAPHYLOCOCCUS SAPROPHYTICUS (*)    All other components within normal limits  URINALYSIS, ROUTINE W REFLEX MICROSCOPIC - Abnormal; Notable for the following components:   APPearance HAZY (*)    Hgb urine dipstick MODERATE (*)    Leukocytes,Ua TRACE (*)    RBC / HPF >50 (*)    Bacteria, UA RARE (*)    All other components within normal limits  LIPASE, BLOOD  COMPREHENSIVE METABOLIC PANEL  CBC  I-STAT BETA HCG BLOOD, ED ([facility], [facility], [facility] ONLY)    EKG None  Radiology No results found.  Procedures Procedures   Medications Ordered in ED Medications  morphine (PF) 4 MG/ML injection 4 mg (4 mg Intravenous Given 07/17/22 1046)  metoCLOPramide (REGLAN) injection 10 mg (10 mg Intravenous Given 07/17/22 1047)  diphenhydrAMINE (BENADRYL) injection 25 mg (25 mg Intravenous Given 07/17/22 1047)  iohexol (OMNIPAQUE) 300 MG/ML solution 100 mL (80 mLs Intravenous Contrast Given 07/17/22 1152)  sodium chloride (PF) 0.9 % injection (  Given by Other 07/17/22 1130)  ibuprofen (ADVIL) tablet 600 mg (600 mg Oral Given 07/17/22 1249)    ED Course/ Medical Decision Making/ A&P Clinical Course as of 07/19/22 1258  Wed Jul 17, 2022  1224 CT scan shows distal left 5 mm kidney stone. [RP]  1337 Per pharmacy would recommend stopping breast feeding for up to 48 hours after last dose from flowmax. Also recommends not breastfeeding while on oxycodone.  [RP]    Clinical Course User Index [RP] Rondel Baton, MD                           Medical Decision Making Amount and/or Complexity of Data Reviewed Labs: ordered. Radiology:  ordered.  Risk OTC drugs. Prescription drug management.   Jill Patel is a 26 y.o. female with comorbidities that complicate the patient evaluation including a medial infection and ovarian cyst who presents emergency department with left lower quadrant abdominal pain  Initial Ddx:  Diverticulitis, ovarian cyst, ovarian torsion, PID, kidney stone, pyelonephritis/UTI  MDM:  Feel the patient likely has diverticulitis given her GI symptoms and location of her pain.  Also on the differential would be an ovarian cyst or torsion's will obtain ultrasound.  Considered PID but feel this is less  likely given the fact that patient is not having any vaginal discharge or fevers.  Will consider pelvic exam and swabs based on ultrasound findings.  Considered pyelonephritis and kidney stone with the patient's CVA tenderness but does not currently have any urinary symptoms.  Plan:  Labs Urinalysis Pregnancy test Pelvic ultrasound CT abdomen pelvis  ED Summary/Re-evaluation:  Patient underwent the above workup and was found to have a 5 mm distal left-sided kidney stone with mild to moderate hydronephrosis.  Pelvic ultrasound without acute abnormality.  Feel that the patient's symptoms are likely due to her kidney stone.  Given the size and location feel it will likely pass soon.  Did discuss pain control and Flomax with pharmacy who feels the patient should withhold breast-feeding while on Flomax and if taking oxycodone.  Discussed this with the patient who felt that she would prefer to treat her symptoms with Tylenol and ibuprofen.  Did still give the patient prescription for Flomax and oxycodone if she should like to take those and she reports that she will not breast-feed if she does decide to take these medicines.  Given a strainer and instructions to follow-up with urology.  Did send urine culture but does not appear that the patient is having symptoms of UTI at this time.  This patient presents to  the ED for concern of complaints listed in HPI, this involves an extensive number of treatment options, and is a complaint that carries with it a high risk of complications and morbidity. Disposition including potential need for admission considered.   Dispo: DC Home. Return precautions discussed including, but not limited to, those listed in the AVS. Allowed pt time to ask questions which were answered fully prior to dc.  Additional history obtained from family Records reviewed Admission Notes The following labs were independently interpreted: Chemistry and Urinalysis and show no acute abnormality I independently reviewed the following imaging with scope of interpretation limited to determining acute life threatening conditions related to emergency care: CT Abdomen/Pelvis and agree with the radiologist interpretation with the following exceptions: None I have reviewed the patients home medications and made adjustments as needed  Social determinants of health: Breast-feeding  Final Clinical Impression(s) / ED Diagnoses Final diagnoses:  Kidney stone on left side  Patient is a currently breast-feeding mother    Rx / DC Orders ED Discharge Orders          Ordered    tamsulosin (FLOMAX) 0.4 MG CAPS capsule  Daily        07/17/22 1339    oxyCODONE (ROXICODONE) 5 MG immediate release tablet  Every 4 hours PRN        07/17/22 1339              Rondel Baton, MD 07/19/22 1258

## 2022-07-17 NOTE — ED Triage Notes (Addendum)
Pt via POV c/o LLQ abdominal pain radiating to back and across lower abdomen, started this morning. Pt has also had n/v/d since waking today. No meds PTA. Pt is 3 months postpartum and currently breastfeeding.

## 2022-07-19 LAB — URINE CULTURE: Culture: 80000 — AB

## 2022-07-20 ENCOUNTER — Telehealth (HOSPITAL_BASED_OUTPATIENT_CLINIC_OR_DEPARTMENT_OTHER): Payer: Self-pay | Admitting: *Deleted

## 2022-07-20 NOTE — Telephone Encounter (Signed)
Post ED Visit - Positive Culture Follow-up: Successful Patient Follow-Up  Culture assessed and recommendations reviewed by:  []  , Pharm.D. []  Enzo Bi, Pharm.D., BCPS AQ-ID []  , Pharm.D., BCPS []  Celedonio Miyamoto, Pharm.D., BCPS []  [place], Garvin Fila.D., BCPS, AAHIVP []  , Pharm.D., BCPS, AAHIVP []  Georgina Pillion, PharmD, BCPS []  , PharmD, BCPS []  [place], PharmD, BCPS [x]  [address], PharmD  Positive urine culture  Symptom check performed. Patient is feeling a lot better. No fevers, or nausea at this time No further treatment indicated at this time  Changes discussed with ED provider: , PA-C   Contacted patient, date 07/20/22, time 1415   07/20/2022, 2:13 PM

## 2022-07-20 NOTE — Progress Notes (Signed)
ED Antimicrobial Stewardship Positive Culture Follow Up   Jill Patel is an 26 y.o. female who presented to [facility] on 07/17/2022 with a chief complaint of  Chief Complaint  Patient presents with   Abdominal Pain    Recent Results (from the past 720 hour(s))  Urine Culture     Status: Abnormal   Collection Time: 07/17/22  1:09 PM   Specimen: Urine, Clean Catch  Result Value Ref Range Status   Specimen Description   Final    URINE, CLEAN CATCH Performed at [facility], [address] [place], [place] [address]    Special Requests   Final    NONE Performed at [facility], [address] [place], [place] [address]    Culture 80,000 COLONIES/mL STAPHYLOCOCCUS SAPROPHYTICUS (A)  Final   Report Status 07/19/2022 FINAL  Final   Organism ID, Bacteria STAPHYLOCOCCUS SAPROPHYTICUS (A)  Final      Susceptibility   Staphylococcus saprophyticus - MIC*    CIPROFLOXACIN <=0.5 SENSITIVE Sensitive     GENTAMICIN <=0.5 SENSITIVE Sensitive     NITROFURANTOIN <=16 SENSITIVE Sensitive     OXACILLIN SENSITIVE Sensitive     TETRACYCLINE <=1 SENSITIVE Sensitive     VANCOMYCIN <=0.5 SENSITIVE Sensitive     TRIMETH/SULFA <=10 SENSITIVE Sensitive     CLINDAMYCIN >=8 RESISTANT Resistant     RIFAMPIN <=0.5 SENSITIVE Sensitive     Inducible Clindamycin NEGATIVE Sensitive     * 80,000 COLONIES/mL STAPHYLOCOCCUS SAPROPHYTICUS    []  Treated with --, organism resistant to prescribed antimicrobial []  Patient discharged originally without antimicrobial agent and treatment is now indicated  New antibiotic prescription:  - Please perform symptom check and to see if pt is still having pain/fever/vomiting.  - if pt is still symptomatic, return to ED for follow up (as it is an extended holiday weekend, want to ensure pt is not worsening as may not be seen at urology until Tuesday at earliest.)   ED Provider: , PA    Monday, PharmD,  BCPS 07/20/2022 1:07 PM

## 2022-07-25 ENCOUNTER — Other Ambulatory Visit: Payer: Self-pay

## 2022-07-25 NOTE — Progress Notes (Signed)
Order for breast pump faxed to [facility]

## 2022-08-14 ENCOUNTER — Ambulatory Visit (INDEPENDENT_AMBULATORY_CARE_PROVIDER_SITE_OTHER): Payer: Medicaid Other | Admitting: General Practice

## 2022-08-14 VITALS — BP 131/73 | HR 61 | Ht 63.0 in | Wt 115.6 lb

## 2022-08-14 DIAGNOSIS — Z3202 Encounter for pregnancy test, result negative: Secondary | ICD-10-CM | POA: Diagnosis not present

## 2022-08-14 DIAGNOSIS — Z3200 Encounter for pregnancy test, result unknown: Secondary | ICD-10-CM

## 2022-08-14 LAB — POCT URINE PREGNANCY: Preg Test, Ur: NEGATIVE

## 2022-08-14 NOTE — Progress Notes (Signed)
Jill Patel presents today for UPT. She has no unusual complaints and headaches, craving, mood swing and other pregnancy symptoms.  LMP: No period since delivery 03/2022    OBJECTIVE: Appears well, in no apparent distress.  OB History     Gravida  4   Para  1   Term  1   Preterm      AB  3   Living  1      SAB  1   IAB  2   Ectopic      Multiple  0   Live Births  1        Obstetric Comments  Elective w/meds x2        Home UPT Result: Negative In-Office UPT result: Negative I have reviewed the patient's medical, obstetrical, social, and family histories, and medications.   ASSESSMENT: Negative pregnancy test  PLAN Beta HCG done today. Will notify pt if results indicate pregnancy.

## 2022-08-15 LAB — BETA HCG QUANT (REF LAB): hCG Quant: <1 m[IU]/mL

## 2022-09-27 ENCOUNTER — Emergency Department (HOSPITAL_COMMUNITY): Payer: Medicaid Other

## 2022-09-27 ENCOUNTER — Emergency Department (HOSPITAL_COMMUNITY)
Admission: EM | Admit: 2022-09-27 | Discharge: 2022-09-28 | Disposition: A | Discharge status: Home / Self Care | Payer: Medicaid Other | Attending: Emergency Medicine | Admitting: Emergency Medicine

## 2022-09-27 ENCOUNTER — Other Ambulatory Visit: Payer: Self-pay

## 2022-09-27 ENCOUNTER — Ambulatory Visit
Admission: EM | Admit: 2022-09-27 | Discharge: 2022-09-27 | Disposition: A | Discharge status: Home / Self Care | Payer: Medicaid Other

## 2022-09-27 ENCOUNTER — Encounter (HOSPITAL_COMMUNITY): Payer: Self-pay

## 2022-09-27 DIAGNOSIS — R1032 Left lower quadrant pain: Secondary | ICD-10-CM

## 2022-09-27 DIAGNOSIS — N132 Hydronephrosis with renal and ureteral calculous obstruction: Secondary | ICD-10-CM | POA: Insufficient documentation

## 2022-09-27 DIAGNOSIS — R34 Anuria and oliguria: Secondary | ICD-10-CM

## 2022-09-27 DIAGNOSIS — N200 Calculus of kidney: Secondary | ICD-10-CM

## 2022-09-27 DIAGNOSIS — R42 Dizziness and giddiness: Secondary | ICD-10-CM | POA: Diagnosis not present

## 2022-09-27 DIAGNOSIS — R197 Diarrhea, unspecified: Secondary | ICD-10-CM | POA: Diagnosis not present

## 2022-09-27 DIAGNOSIS — D72829 Elevated white blood cell count, unspecified: Secondary | ICD-10-CM | POA: Diagnosis not present

## 2022-09-27 DIAGNOSIS — R112 Nausea with vomiting, unspecified: Secondary | ICD-10-CM

## 2022-09-27 DIAGNOSIS — N1330 Unspecified hydronephrosis: Secondary | ICD-10-CM | POA: Diagnosis not present

## 2022-09-27 LAB — URINALYSIS, ROUTINE W REFLEX MICROSCOPIC
Bacteria, UA: NONE SEEN
Bilirubin Urine: NEGATIVE
Glucose, UA: NEGATIVE mg/dL
Ketones, ur: 80 mg/dL — AB
Nitrite: NEGATIVE
Protein, ur: 30 mg/dL — AB
Specific Gravity, Urine: 1.018 (ref 1.005–1.030)
WBC, UA: >50 WBC/hpf (ref 0–5)
pH: 7 (ref 5.0–8.0)

## 2022-09-27 LAB — I-STAT CHEM 8, ED
BUN: 12 mg/dL (ref 6–20)
Calcium, Ion: 1.24 mmol/L (ref 1.15–1.40)
Chloride: 108 mmol/L (ref 98–111)
Creatinine, Ser: 0.6 mg/dL (ref 0.44–1.00)
Glucose, Bld: 96 mg/dL (ref 70–99)
HCT: 37 % (ref 36.0–46.0)
Hemoglobin: 12.6 g/dL (ref 12.0–15.0)
Potassium: 4 mmol/L (ref 3.5–5.1)
Sodium: 142 mmol/L (ref 135–145)
TCO2: 23 mmol/L (ref 22–32)

## 2022-09-27 LAB — COMPREHENSIVE METABOLIC PANEL
ALT: 18 U/L (ref 0–44)
AST: 25 U/L (ref 15–41)
Albumin: 4.8 g/dL (ref 3.5–5.0)
Alkaline Phosphatase: 81 U/L (ref 38–126)
Anion gap: 19 — ABNORMAL HIGH (ref 5–15)
BUN: 11 mg/dL (ref 6–20)
CO2: 17 mmol/L — ABNORMAL LOW (ref 22–32)
Calcium: 9.9 mg/dL (ref 8.9–10.3)
Chloride: 107 mmol/L (ref 98–111)
Creatinine, Ser: 0.82 mg/dL (ref 0.44–1.00)
GFR, Estimated: >60 mL/min (ref >60)
Glucose, Bld: 102 mg/dL — ABNORMAL HIGH (ref 70–99)
Potassium: 4 mmol/L (ref 3.5–5.1)
Sodium: 143 mmol/L (ref 135–145)
Total Bilirubin: 0.8 mg/dL (ref 0.3–1.2)
Total Protein: 7.7 g/dL (ref 6.5–8.1)

## 2022-09-27 LAB — CBC WITH DIFFERENTIAL/PLATELET
Abs Immature Granulocytes: 0.03 10*3/uL (ref 0.00–0.07)
Basophils Absolute: 0 10*3/uL (ref 0.0–0.1)
Basophils Relative: 0 %
Eosinophils Absolute: 0 10*3/uL (ref 0.0–0.5)
Eosinophils Relative: 0 %
HCT: 37.1 % (ref 36.0–46.0)
Hemoglobin: 12.1 g/dL (ref 12.0–15.0)
Immature Granulocytes: 0 %
Lymphocytes Relative: 6 %
Lymphs Abs: 0.7 10*3/uL (ref 0.7–4.0)
MCH: 29.9 pg (ref 26.0–34.0)
MCHC: 32.6 g/dL (ref 30.0–36.0)
MCV: 91.6 fL (ref 80.0–100.0)
Monocytes Absolute: 0.3 10*3/uL (ref 0.1–1.0)
Monocytes Relative: 2 %
Neutro Abs: 11.7 10*3/uL — ABNORMAL HIGH (ref 1.7–7.7)
Neutrophils Relative %: 92 %
Platelets: 241 10*3/uL (ref 150–400)
RBC: 4.05 MIL/uL (ref 3.87–5.11)
RDW: 11.9 % (ref 11.5–15.5)
WBC: 12.8 10*3/uL — ABNORMAL HIGH (ref 4.0–10.5)
nRBC: 0 % (ref 0.0–0.2)

## 2022-09-27 LAB — LIPASE, BLOOD: Lipase: 29 U/L (ref 11–51)

## 2022-09-27 LAB — I-STAT BETA HCG BLOOD, ED (MC, WL, AP ONLY): I-stat hCG, quantitative: <5 m[IU]/mL (ref <5)

## 2022-09-27 MED ORDER — IOHEXOL 350 MG/ML SOLN
75.0000 mL | Freq: Once | INTRAVENOUS | Status: AC | PRN
  Administered 2022-09-27: 75 mL via INTRAVENOUS

## 2022-09-27 MED ORDER — HYDROCODONE-ACETAMINOPHEN 5-325 MG PO TABS: 1.0000 | ORAL_TABLET | Freq: Four times a day (QID) | ORAL | 0 refills | Status: DC | PRN

## 2022-09-27 MED ORDER — ONDANSETRON HCL 4 MG PO TABS: 4.0000 mg | ORAL_TABLET | Freq: Four times a day (QID) | ORAL | 0 refills | Status: DC | PRN

## 2022-09-27 MED ORDER — KETOROLAC TROMETHAMINE 15 MG/ML IJ SOLN
15.0000 mg | Freq: Once | INTRAMUSCULAR | Status: AC
  Administered 2022-09-27: 15 mg via INTRAVENOUS
  Filled 2022-09-27: qty 1

## 2022-09-27 MED ORDER — ONDANSETRON 4 MG PO TBDP
8.0000 mg | ORAL_TABLET | Freq: Once | ORAL | Status: DC
  Filled 2022-09-27: qty 2

## 2022-09-27 MED ORDER — TAMSULOSIN HCL 0.4 MG PO CAPS: 0.4000 mg | ORAL_CAPSULE | Freq: Every day | ORAL | 0 refills | Status: DC

## 2022-09-27 MED ORDER — ONDANSETRON HCL 4 MG/2ML IJ SOLN
4.0000 mg | Freq: Once | INTRAMUSCULAR | Status: AC
  Administered 2022-09-27: 4 mg via INTRAVENOUS
  Filled 2022-09-27: qty 2

## 2022-09-27 MED ORDER — SODIUM CHLORIDE 0.9 % IV BOLUS
500.0000 mL | Freq: Once | INTRAVENOUS | Status: AC
  Administered 2022-09-27: 500 mL via INTRAVENOUS

## 2022-09-27 NOTE — Discharge Instructions (Signed)
Please return to the ED with any new or worsening signs or symptoms Please follow-up with urology for further management Please push fluids to ensure that you are urinating frequently.  Please take Zofran as needed for nausea and vomiting every 6 hours.  Please take hydrocodone as needed for pain every 6 hours.  Please take tamsulosin once in the morning to help pass your stone. Please read attached guide concerning dietary guidelines to prevent kidney stones

## 2022-09-27 NOTE — Discharge Instructions (Signed)
Please go to the emergency department as soon as you leave urgent care for further evaluation and management. ?
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
## 2022-09-27 NOTE — ED Notes (Signed)
Patient is being discharged from the Urgent Care and sent to the Emergency Department via private vehicle . Per Towana Badger NP, patient is in need of higher level of care due to abdominal pain. Patient is aware and verbalizes understanding of plan of care.  Vitals:   09/27/22 1915  BP: 116/75  Pulse: 72  Resp: 16  Temp: 98 F (36.7 C)  SpO2: 98%

## 2022-09-27 NOTE — ED Triage Notes (Signed)
Pt states LLQ abd pain radiating to her back with N/V/D since this morning. Has not taken anything at home for her symptoms.

## 2022-09-27 NOTE — ED Provider Triage Note (Signed)
Emergency Medicine Provider Triage Evaluation Note  Jill Patel , a 27 y.o. female  was evaluated in triage.  Pt complains of LLQ with nausea, vomiting, diarrhea. Denies fevers, chills. Went to UC, sent to the ER. Otherwise healthy.  Review of Systems  Positive:  Negative:   Physical Exam  BP 124/79   Pulse 79   Temp 98.7 F (37.1 C)   Resp 16   Ht '5\' 3"'$  (1.6 m)   Wt 52.6 kg   LMP 07/02/2021 (Approximate)   SpO2 100%   Breastfeeding Yes   BMI 20.55 kg/m  Gen:   Awake, no distress   Resp:  Normal effort  MSK:   Moves extremities without difficulty  Other:    Medical Decision Making  Medically screening exam initiated at 8:30 PM.  Appropriate orders placed.  Teauna Vanroekel was informed that the remainder of the evaluation will be completed by another provider, this initial triage assessment does not replace that evaluation, and the importance of remaining in the ED until their evaluation is complete.     Tacy Learn, PA-C 09/27/22 2030

## 2022-09-27 NOTE — ED Provider Notes (Signed)
[facility] Provider Note   CSN: [id number] Arrival date & time: 09/27/22  1957     History  Chief Complaint  Patient presents with   Abdominal Pain    Jill Patel is a 27 y.o. female with medical history of kidney stones.  Patient presents to ED for evaluation of left lower quadrant abdominal pain and flank pain.  Patient reports that this morning she woke up with left lower quadrant abdominal pain that progressively worsened and turned into flank pain throughout the course of the day.  Patient reports she has been very nauseous and having vomiting throughout the course of the day, decreased urine stream.  Patient denies any overt dysuria.  Patient denies any fevers at home.  Patient reports went to urgent care and was redirected to ED for evaluation.   Abdominal Pain      Home Medications Prior to Admission medications   Medication Sig Start Date End Date Taking? Authorizing Provider  HYDROcodone-acetaminophen (NORCO/VICODIN) 5-325 MG tablet Take 1 tablet by mouth every 6 (six) hours as needed for severe pain. 09/27/22  Yes Azucena Cecil, PA-C  ondansetron (ZOFRAN) 4 MG tablet Take 1 tablet (4 mg total) by mouth every 6 (six) hours as needed for nausea or vomiting. 09/27/22  Yes Azucena Cecil, PA-C  tamsulosin (FLOMAX) 0.4 MG CAPS capsule Take 1 capsule (0.4 mg total) by mouth daily after breakfast. 09/27/22  Yes Azucena Cecil, PA-C  acetaminophen (TYLENOL) 325 MG tablet Take 2 tablets (650 mg total) by mouth every 4 (four) hours as needed (for pain scale < 4). 04/18/22   Mercado-Ortiz, Ernestine Conrad, DO  benzocaine-Menthol (DERMOPLAST) 20-0.5 % AERO Apply 1 Application topically as needed for irritation (perineal discomfort). Patient not taking: Reported on 06/06/2022 04/18/22   Mercado-Ortiz, Ernestine Conrad, DO  ibuprofen (ADVIL) 600 MG tablet Take 1 tablet (600 mg total) by mouth every 6 (six) hours. Patient not taking:  Reported on 06/06/2022 04/18/22   Mercado-Ortiz, Ernestine Conrad, DO  Iron, Ferrous Sulfate, 325 (65 Fe) MG TABS Take 1 tablet by mouth every other day. Patient not taking: Reported on 02/27/2022 01/29/22   Renard Matter, MD  oxyCODONE (ROXICODONE) 5 MG immediate release tablet Take 1 tablet (5 mg total) by mouth every 4 (four) hours as needed for severe pain. 07/17/22   Fransico Meadow, MD  senna-docusate (SENOKOT-S) 8.6-50 MG tablet Take 2 tablets by mouth daily. Patient not taking: Reported on 06/06/2022 04/18/22   Shelda Pal, DO      Allergies    Kiwi extract, Aleve [naproxen sodium], Mango flavor, Other, and Pineapple    Review of Systems   Review of Systems  Gastrointestinal:  Positive for abdominal pain.  All other systems reviewed and are negative.   Physical Exam Updated Vital Signs BP 124/79   Pulse 79   Temp 98.7 F (37.1 C)   Resp 16   Ht '5\' 3"'$  (1.6 m)   Wt 52.6 kg   LMP 07/02/2021 (Approximate)   SpO2 100%   Breastfeeding Yes   BMI 20.55 kg/m  Physical Exam Vitals and nursing note reviewed.  Constitutional:      General: She is not in acute distress.    Appearance: She is well-developed. She is not ill-appearing, toxic-appearing or diaphoretic.  HENT:     Head: Normocephalic and atraumatic.     Nose: Nose normal.     Mouth/Throat:     Mouth: Mucous membranes are moist.  Pharynx: Oropharynx is clear.  Eyes:     Extraocular Movements: Extraocular movements intact.     Conjunctiva/sclera: Conjunctivae normal.     Pupils: Pupils are equal, round, and reactive to light.  Cardiovascular:     Rate and Rhythm: Normal rate and regular rhythm.  Pulmonary:     Effort: Pulmonary effort is normal.     Breath sounds: Normal breath sounds. No wheezing.  Abdominal:     Tenderness: There is abdominal tenderness. There is left CVA tenderness.  Musculoskeletal:     Cervical back: Normal range of motion and neck supple. No tenderness.  Skin:    General: Skin is  warm and dry.     Capillary Refill: Capillary refill takes less than 2 seconds.  Neurological:     Mental Status: She is alert and oriented to person, place, and time.     ED Results / Procedures / Treatments   Labs (all labs ordered are listed, but only abnormal results are displayed) Labs Reviewed  CBC WITH DIFFERENTIAL/PLATELET - Abnormal; Notable for the following components:      Result Value   WBC 12.8 (*)    Neutro Abs 11.7 (*)    All other components within normal limits  COMPREHENSIVE METABOLIC PANEL - Abnormal; Notable for the following components:   CO2 17 (*)    Glucose, Bld 102 (*)    Anion gap 19 (*)    All other components within normal limits  URINALYSIS, ROUTINE W REFLEX MICROSCOPIC - Abnormal; Notable for the following components:   APPearance CLOUDY (*)    Hgb urine dipstick SMALL (*)    Ketones, ur 80 (*)    Protein, ur 30 (*)    Leukocytes,Ua MODERATE (*)    All other components within normal limits  LIPASE, BLOOD  I-STAT BETA HCG BLOOD, ED ([facility], [facility], [facility] ONLY)  I-STAT CHEM 8, ED    EKG None  Radiology CT Abdomen Pelvis W Contrast  Result Date: 09/27/2022 CLINICAL DATA:  Left lower quadrant abdominal pain EXAM: CT ABDOMEN AND PELVIS WITH CONTRAST TECHNIQUE: Multidetector CT imaging of the abdomen and pelvis was performed using the standard protocol following bolus administration of intravenous contrast. RADIATION DOSE REDUCTION: This exam was performed according to the departmental dose-optimization program which includes automated exposure control, adjustment of the mA and/or kV according to patient size and/or use of iterative reconstruction technique. CONTRAST:  64m OMNIPAQUE IOHEXOL 350 MG/ML SOLN COMPARISON:  CT abdomen and pelvis 07/17/2022 FINDINGS: Lower chest: No acute abnormality. Hepatobiliary: No focal liver abnormality is seen. No gallstones, gallbladder wall thickening, or biliary dilatation. Pancreas: Unremarkable. No pancreatic ductal  dilatation or surrounding inflammatory changes. Spleen: Normal in size without focal abnormality. Adrenals/Urinary Tract: There are 3 left renal calculi measuring up to 8 mm, unchanged. There is a 4 mm calculus in the distal left ureter just proximal to the ureterovesicular junction similar to prior. There is mild left-sided hydronephrosis. Adrenal glands, right kidney and bladder are within normal limits. Stomach/Bowel: Stomach is within normal limits. Appendix is not seen. No evidence of bowel wall thickening, distention, or inflammatory changes. Vascular/Lymphatic: No significant vascular findings are present. No enlarged abdominal or pelvic lymph nodes. Reproductive: Uterus and bilateral adnexa are unremarkable. Other: There is trace free fluid in the pelvis. There is no abdominal wall hernia. Musculoskeletal: No acute or significant osseous findings. IMPRESSION: 1. 4 mm calculus in the distal left ureter with mild obstructive uropathy. 2. Nonobstructing left renal calculi. 3. Trace free fluid in  the pelvis. Electronically Signed   By: Ronney Asters M.D.   On: 09/27/2022 22:16    Procedures Procedures   Medications Ordered in ED Medications  iohexol (OMNIPAQUE) 350 MG/ML injection 75 mL (75 mLs Intravenous Contrast Given 09/27/22 2209)  sodium chloride 0.9 % bolus 500 mL (500 mLs Intravenous New Bag/Given 09/27/22 2304)  ketorolac (TORADOL) 15 MG/ML injection 15 mg (15 mg Intravenous Given 09/27/22 2305)  ondansetron (ZOFRAN) injection 4 mg (4 mg Intravenous Given 09/27/22 2305)    ED Course/ Medical Decision Making/ A&P  Medical Decision Making Risk Prescription drug management.   27 year old female presents to the ED for evaluation.  Please see HPI for further details.  On examination the patient is afebrile and nontachycardic.  The patient lung sounds are clear bilaterally, she is not hypoxic on room air.  Abdomen is soft and compressible however the patient does have left lower quadrant  abdominal tenderness as well as left-sided CVA tenderness.  The patient is nontoxic in appearance.  CBC with leukocytosis of 12.8.  Patient CMP without electrolyte derangement, no elevation of creatinine.  Patient urinalysis shows moderate leukocytes, small hemoglobin.  Patient lipase unremarkable.  Patient CT abdomen pelvis shows 4 mm calculus in the distal left ureter with mild obstructive uropathy.  Patient provided 500 mL fluid, 50 mg Toradol for pain, 4 mg Zofran for nausea.  Patient reports feeling better after this is administered.  At this time patient will be sent home and advised to follow-up with urology.  Patient states that she had kidney stone in the last 1 month so she already has urology follow-up.  The patient will also be sent home with tamsulosin, pain medication, antinausea medication.  Patient advised to follow-up with urology.  Patient given return precautions and she voiced understanding.  Patient had all of her questions answered to her satisfaction.  The patient is stable for discharge.  Final Clinical Impression(s) / ED Diagnoses Final diagnoses:  Kidney stone    Rx / DC Orders ED Discharge Orders          Ordered    tamsulosin (FLOMAX) 0.4 MG CAPS capsule  Daily after breakfast        09/27/22 2353    HYDROcodone-acetaminophen (NORCO/VICODIN) 5-325 MG tablet  Every 6 hours PRN        09/27/22 2353    ondansetron (ZOFRAN) 4 MG tablet  Every 6 hours PRN        09/27/22 2353              Azucena Cecil, PA-C 09/27/22 2357    Blanchie Dessert, MD 09/28/22 1856

## 2022-09-27 NOTE — ED Triage Notes (Signed)
Pt reports she woke up this morning with LLQ abdominal pain and back pain associated with nausea, vomiting, chest pain and reports she feels lightheaded. She was sent here from Urgent Care.

## 2022-09-30 ENCOUNTER — Other Ambulatory Visit: Payer: Self-pay

## 2022-09-30 ENCOUNTER — Emergency Department (HOSPITAL_COMMUNITY)
Admission: EM | Admit: 2022-09-30 | Discharge: 2022-09-30 | Disposition: A | Discharge status: Home / Self Care | Payer: Medicaid Other | Source: Home / Self Care | Attending: Emergency Medicine | Admitting: Emergency Medicine

## 2022-09-30 ENCOUNTER — Encounter (HOSPITAL_COMMUNITY): Payer: Self-pay | Admitting: Emergency Medicine

## 2022-09-30 DIAGNOSIS — J45909 Unspecified asthma, uncomplicated: Secondary | ICD-10-CM | POA: Insufficient documentation

## 2022-09-30 DIAGNOSIS — N201 Calculus of ureter: Secondary | ICD-10-CM | POA: Insufficient documentation

## 2022-09-30 LAB — BASIC METABOLIC PANEL
Anion gap: 12 (ref 5–15)
BUN: 13 mg/dL (ref 6–20)
CO2: 21 mmol/L — ABNORMAL LOW (ref 22–32)
Calcium: 9.1 mg/dL (ref 8.9–10.3)
Chloride: 107 mmol/L (ref 98–111)
Creatinine, Ser: 0.93 mg/dL (ref 0.44–1.00)
GFR, Estimated: >60 mL/min (ref >60)
Glucose, Bld: 81 mg/dL (ref 70–99)
Potassium: 3.3 mmol/L — ABNORMAL LOW (ref 3.5–5.1)
Sodium: 140 mmol/L (ref 135–145)

## 2022-09-30 LAB — URINALYSIS, W/ REFLEX TO CULTURE (INFECTION SUSPECTED)
Bacteria, UA: NONE SEEN
Bilirubin Urine: NEGATIVE
Glucose, UA: NEGATIVE mg/dL
Ketones, ur: 20 mg/dL — AB
Leukocytes,Ua: NEGATIVE
Nitrite: NEGATIVE
Protein, ur: NEGATIVE mg/dL
Specific Gravity, Urine: 1.023 (ref 1.005–1.030)
pH: 5 (ref 5.0–8.0)

## 2022-09-30 LAB — CBC
HCT: 34.8 % — ABNORMAL LOW (ref 36.0–46.0)
Hemoglobin: 11.5 g/dL — ABNORMAL LOW (ref 12.0–15.0)
MCH: 30.4 pg (ref 26.0–34.0)
MCHC: 33 g/dL (ref 30.0–36.0)
MCV: 92.1 fL (ref 80.0–100.0)
Platelets: 221 10*3/uL (ref 150–400)
RBC: 3.78 MIL/uL — ABNORMAL LOW (ref 3.87–5.11)
RDW: 12.1 % (ref 11.5–15.5)
WBC: 9.1 10*3/uL (ref 4.0–10.5)
nRBC: 0 % (ref 0.0–0.2)

## 2022-09-30 MED ORDER — KETOROLAC TROMETHAMINE 15 MG/ML IJ SOLN
15.0000 mg | Freq: Once | INTRAMUSCULAR | Status: AC
  Administered 2022-09-30: 15 mg via INTRAVENOUS
  Filled 2022-09-30: qty 1

## 2022-09-30 MED ORDER — ONDANSETRON HCL 4 MG/2ML IJ SOLN
4.0000 mg | Freq: Once | INTRAMUSCULAR | Status: AC
  Administered 2022-09-30: 4 mg via INTRAVENOUS
  Filled 2022-09-30: qty 2

## 2022-09-30 NOTE — Discharge Instructions (Signed)
Take the pain medicines as needed.  Follow-up with urology.  Watch for fevers.

## 2022-09-30 NOTE — ED Triage Notes (Signed)
Pt here from home with continued back pain from kidney stone , was sent home on pain meds but states that she can't take it due to breast feeding ,

## 2022-09-30 NOTE — ED Provider Notes (Signed)
[facility] Provider Note   CSN: [id number] Arrival date & time: 09/30/22  0915     History {Add pertinent medical, surgical, social history, OB history to HPI:1} No chief complaint on file.   Jill Patel is a 27 y.o. female.  HPI Patient presents with left back and flank pain.  Had been seen in the ER recently and diagnosed with 4 mm kidney stone.  Admitted feeling better but now pain improved.  States she was sent home with hydrocodone but did not take it because she is breast-feeding.  Pain in the left lower abdomen but goes up to the left flank also.  No fevers.  Patients child is 23 months old.   Past Medical History:  Diagnosis Date   Anemia    Anxiety    Asthma    Depression    never on meds   Headache    Heart murmur    at birth   Kidney cysts    Ovarian cyst    Scoliosis     Home Medications Prior to Admission medications   Medication Sig Start Date End Date Taking? Authorizing Provider  acetaminophen (TYLENOL) 325 MG tablet Take 2 tablets (650 mg total) by mouth every 4 (four) hours as needed (for pain scale < 4). 04/18/22   Mercado-Ortiz, Ernestine Conrad, DO  benzocaine-Menthol (DERMOPLAST) 20-0.5 % AERO Apply 1 Application topically as needed for irritation (perineal discomfort). Patient not taking: Reported on 06/06/2022 04/18/22   Shelda Pal, DO  HYDROcodone-acetaminophen (NORCO/VICODIN) 5-325 MG tablet Take 1 tablet by mouth every 6 (six) hours as needed for severe pain. 09/27/22   Azucena Cecil, PA-C  ibuprofen (ADVIL) 600 MG tablet Take 1 tablet (600 mg total) by mouth every 6 (six) hours. Patient not taking: Reported on 06/06/2022 04/18/22   Mercado-Ortiz, Ernestine Conrad, DO  Iron, Ferrous Sulfate, 325 (65 Fe) MG TABS Take 1 tablet by mouth every other day. Patient not taking: Reported on 02/27/2022 01/29/22   Renard Matter, MD  ondansetron (ZOFRAN) 4 MG tablet Take 1 tablet (4 mg total) by mouth every 6  (six) hours as needed for nausea or vomiting. 09/27/22   Azucena Cecil, PA-C  oxyCODONE (ROXICODONE) 5 MG immediate release tablet Take 1 tablet (5 mg total) by mouth every 4 (four) hours as needed for severe pain. 07/17/22   Fransico Meadow, MD  senna-docusate (SENOKOT-S) 8.6-50 MG tablet Take 2 tablets by mouth daily. Patient not taking: Reported on 06/06/2022 04/18/22   Mercado-Ortiz, Ernestine Conrad, DO  tamsulosin (FLOMAX) 0.4 MG CAPS capsule Take 1 capsule (0.4 mg total) by mouth daily after breakfast. 09/27/22   Azucena Cecil, PA-C      Allergies    Kiwi extract, Aleve [naproxen sodium], Mango flavor, Other, and Pineapple    Review of Systems   Review of Systems  Physical Exam Updated Vital Signs BP 121/75 (BP Location: Right Arm)   Pulse 61   Temp 98.1 F (36.7 C) (Oral)   Resp 16   LMP 07/02/2021 (Approximate)   SpO2 96%  Physical Exam Vitals and nursing note reviewed.  HENT:     Head: Normocephalic.  Eyes:     Pupils: Pupils are equal, round, and reactive to light.  Cardiovascular:     Rate and Rhythm: Normal rate.  Pulmonary:     Effort: Pulmonary effort is normal.  Abdominal:     Tenderness: There is no abdominal tenderness.  Genitourinary:  Comments: CVA tenderness on left. Skin:    General: Skin is warm.     Capillary Refill: Capillary refill takes less than 2 seconds.  Neurological:     Mental Status: She is alert and oriented to person, place, and time.     ED Results / Procedures / Treatments   Labs (all labs ordered are listed, but only abnormal results are displayed) Labs Reviewed  URINALYSIS, W/ REFLEX TO CULTURE (INFECTION SUSPECTED)  CBC  BASIC METABOLIC PANEL    EKG None  Radiology No results found.  Procedures Procedures  {Document cardiac monitor, telemetry assessment procedure when appropriate:1}  Medications Ordered in ED Medications  ketorolac (TORADOL) 15 MG/ML injection 15 mg (15 mg Intravenous Given 09/30/22 0952)   ondansetron (ZOFRAN) injection 4 mg (4 mg Intravenous Given 09/30/22 [id number])    ED Course/ Medical Decision Making/ A&P   {   Click here for ABCD2, HEART and other calculatorsREFRESH Note before signing :1}                          Medical Decision Making Amount and/or Complexity of Data Reviewed Labs: ordered.  Risk Prescription drug management.   Patient with left flank pain.  Left lower quadrant up to left flank.  Recent CT scan that showed 4 mm distal stone.  Reviewed note and CT scan.  Also had CT scan done around 3 months ago that showed stone in similar location.  I think this is the most likely cause of the pain.  Will check basic blood work and urinalysis.  Will treat with Toradol.  Discussed with pharmacist about safety for breast-feeding.  Reviewed allergies and has had some GI bleeding with NSAIDs however I think a second dose after first dose 3 days ago is reasonable at this time.  {Document critical care time when appropriate:1} {Document review of labs and clinical decision tools ie heart score, Chads2Vasc2 etc:1}  {Document your independent review of radiology images, and any outside records:1} {Document your discussion with family members, caretakers, and with consultants:1} {Document social determinants of health affecting pt's care:1} {Document your decision making why or why not admission, treatments were needed:1} Final Clinical Impression(s) / ED Diagnoses Final diagnoses:  None    Rx / DC Orders ED Discharge Orders     None

## 2022-10-01 ENCOUNTER — Encounter (HOSPITAL_COMMUNITY)
Admission: EM | Disposition: A | Discharge status: Home / Self Care | Payer: Self-pay | Source: Home / Self Care | Attending: Internal Medicine

## 2022-10-01 ENCOUNTER — Encounter (HOSPITAL_BASED_OUTPATIENT_CLINIC_OR_DEPARTMENT_OTHER): Payer: Self-pay | Admitting: Emergency Medicine

## 2022-10-01 ENCOUNTER — Other Ambulatory Visit: Payer: Self-pay

## 2022-10-01 ENCOUNTER — Emergency Department (HOSPITAL_BASED_OUTPATIENT_CLINIC_OR_DEPARTMENT_OTHER): Payer: Medicaid Other

## 2022-10-01 ENCOUNTER — Inpatient Hospital Stay (HOSPITAL_BASED_OUTPATIENT_CLINIC_OR_DEPARTMENT_OTHER)
Admission: EM | Admit: 2022-10-01 | Discharge: 2022-10-03 | DRG: 660 | Disposition: A | Discharge status: Home / Self Care | Payer: Medicaid Other | Attending: Internal Medicine | Admitting: Internal Medicine

## 2022-10-01 DIAGNOSIS — Z833 Family history of diabetes mellitus: Secondary | ICD-10-CM | POA: Diagnosis not present

## 2022-10-01 DIAGNOSIS — N200 Calculus of kidney: Secondary | ICD-10-CM | POA: Diagnosis not present

## 2022-10-01 DIAGNOSIS — A419 Sepsis, unspecified organism: Secondary | ICD-10-CM | POA: Diagnosis not present

## 2022-10-01 DIAGNOSIS — R6510 Systemic inflammatory response syndrome (SIRS) of non-infectious origin without acute organ dysfunction: Secondary | ICD-10-CM | POA: Diagnosis not present

## 2022-10-01 DIAGNOSIS — N201 Calculus of ureter: Secondary | ICD-10-CM

## 2022-10-01 DIAGNOSIS — R109 Unspecified abdominal pain: Secondary | ICD-10-CM

## 2022-10-01 DIAGNOSIS — N12 Tubulo-interstitial nephritis, not specified as acute or chronic: Secondary | ICD-10-CM | POA: Diagnosis present

## 2022-10-01 DIAGNOSIS — Z886 Allergy status to analgesic agent status: Secondary | ICD-10-CM

## 2022-10-01 DIAGNOSIS — Z91018 Allergy to other foods: Secondary | ICD-10-CM | POA: Diagnosis not present

## 2022-10-01 DIAGNOSIS — D563 Thalassemia minor: Secondary | ICD-10-CM | POA: Diagnosis not present

## 2022-10-01 DIAGNOSIS — E860 Dehydration: Secondary | ICD-10-CM | POA: Diagnosis not present

## 2022-10-01 DIAGNOSIS — F32A Depression, unspecified: Secondary | ICD-10-CM | POA: Diagnosis not present

## 2022-10-01 DIAGNOSIS — Z8249 Family history of ischemic heart disease and other diseases of the circulatory system: Secondary | ICD-10-CM

## 2022-10-01 DIAGNOSIS — M419 Scoliosis, unspecified: Secondary | ICD-10-CM | POA: Diagnosis not present

## 2022-10-01 DIAGNOSIS — E876 Hypokalemia: Secondary | ICD-10-CM | POA: Diagnosis not present

## 2022-10-01 DIAGNOSIS — R739 Hyperglycemia, unspecified: Secondary | ICD-10-CM | POA: Diagnosis not present

## 2022-10-01 DIAGNOSIS — N202 Calculus of kidney with calculus of ureter: Secondary | ICD-10-CM | POA: Diagnosis not present

## 2022-10-01 DIAGNOSIS — R9431 Abnormal electrocardiogram [ECG] [EKG]: Secondary | ICD-10-CM | POA: Diagnosis not present

## 2022-10-01 DIAGNOSIS — N390 Urinary tract infection, site not specified: Secondary | ICD-10-CM | POA: Diagnosis present

## 2022-10-01 DIAGNOSIS — F419 Anxiety disorder, unspecified: Secondary | ICD-10-CM | POA: Diagnosis present

## 2022-10-01 DIAGNOSIS — J45909 Unspecified asthma, uncomplicated: Secondary | ICD-10-CM | POA: Diagnosis not present

## 2022-10-01 DIAGNOSIS — Z87891 Personal history of nicotine dependence: Secondary | ICD-10-CM

## 2022-10-01 DIAGNOSIS — N136 Pyonephrosis: Secondary | ICD-10-CM | POA: Diagnosis not present

## 2022-10-01 HISTORY — PX: CYSTOSCOPY W/ URETERAL STENT PLACEMENT: SHX1429

## 2022-10-01 LAB — CBC WITH DIFFERENTIAL/PLATELET
Abs Immature Granulocytes: 0.04 10*3/uL (ref 0.00–0.07)
Basophils Absolute: 0 10*3/uL (ref 0.0–0.1)
Basophils Relative: 0 %
Eosinophils Absolute: 0 10*3/uL (ref 0.0–0.5)
Eosinophils Relative: 0 %
HCT: 32.3 % — ABNORMAL LOW (ref 36.0–46.0)
Hemoglobin: 11.2 g/dL — ABNORMAL LOW (ref 12.0–15.0)
Immature Granulocytes: 0 %
Lymphocytes Relative: 13 %
Lymphs Abs: 1.6 10*3/uL (ref 0.7–4.0)
MCH: 30 pg (ref 26.0–34.0)
MCHC: 34.7 g/dL (ref 30.0–36.0)
MCV: 86.6 fL (ref 80.0–100.0)
Monocytes Absolute: 1.4 10*3/uL — ABNORMAL HIGH (ref 0.1–1.0)
Monocytes Relative: 11 %
Neutro Abs: 9.6 10*3/uL — ABNORMAL HIGH (ref 1.7–7.7)
Neutrophils Relative %: 76 %
Platelets: 237 10*3/uL (ref 150–400)
RBC: 3.73 MIL/uL — ABNORMAL LOW (ref 3.87–5.11)
RDW: 11.6 % (ref 11.5–15.5)
WBC: 12.7 10*3/uL — ABNORMAL HIGH (ref 4.0–10.5)
nRBC: 0 % (ref 0.0–0.2)

## 2022-10-01 LAB — COMPREHENSIVE METABOLIC PANEL
ALT: 12 U/L (ref 0–44)
AST: 16 U/L (ref 15–41)
Albumin: 4.1 g/dL (ref 3.5–5.0)
Alkaline Phosphatase: 76 U/L (ref 38–126)
Anion gap: 9 (ref 5–15)
BUN: 9 mg/dL (ref 6–20)
CO2: 22 mmol/L (ref 22–32)
Calcium: 8.2 mg/dL — ABNORMAL LOW (ref 8.9–10.3)
Chloride: 99 mmol/L (ref 98–111)
Creatinine, Ser: 0.9 mg/dL (ref 0.44–1.00)
GFR, Estimated: >60 mL/min (ref >60)
Glucose, Bld: 96 mg/dL (ref 70–99)
Potassium: 2.5 mmol/L — CL (ref 3.5–5.1)
Sodium: 131 mmol/L — ABNORMAL LOW (ref 135–145)
Total Bilirubin: 1 mg/dL (ref 0.3–1.2)
Total Protein: 7.3 g/dL (ref 6.5–8.1)

## 2022-10-01 LAB — URINALYSIS, ROUTINE W REFLEX MICROSCOPIC
Bilirubin Urine: NEGATIVE
Glucose, UA: NEGATIVE mg/dL
Ketones, ur: 15 mg/dL — AB
Nitrite: NEGATIVE
Protein, ur: NEGATIVE mg/dL
Specific Gravity, Urine: <=1.005 (ref 1.005–1.030)
pH: 6 (ref 5.0–8.0)

## 2022-10-01 LAB — URINALYSIS, MICROSCOPIC (REFLEX)

## 2022-10-01 LAB — I-STAT CHEM 8, ED
BUN: 5 mg/dL — ABNORMAL LOW (ref 6–20)
Calcium, Ion: 1.12 mmol/L — ABNORMAL LOW (ref 1.15–1.40)
Chloride: 102 mmol/L (ref 98–111)
Creatinine, Ser: 0.7 mg/dL (ref 0.44–1.00)
Glucose, Bld: 97 mg/dL (ref 70–99)
HCT: 32 % — ABNORMAL LOW (ref 36.0–46.0)
Hemoglobin: 10.9 g/dL — ABNORMAL LOW (ref 12.0–15.0)
Potassium: 3.7 mmol/L (ref 3.5–5.1)
Sodium: 137 mmol/L (ref 135–145)
TCO2: 21 mmol/L — ABNORMAL LOW (ref 22–32)

## 2022-10-01 LAB — MAGNESIUM: Magnesium: 1.7 mg/dL (ref 1.7–2.4)

## 2022-10-01 LAB — PREGNANCY, URINE: Preg Test, Ur: NEGATIVE

## 2022-10-01 SURGERY — CYSTOSCOPY, WITH RETROGRADE PYELOGRAM AND URETERAL STENT INSERTION
Anesthesia: General | Site: Ureter | Laterality: Left

## 2022-10-01 MED ORDER — ONDANSETRON HCL 4 MG/2ML IJ SOLN: 4.0000 mg | Freq: Four times a day (QID) | INTRAMUSCULAR | Status: DC | PRN

## 2022-10-01 MED ORDER — MIDAZOLAM HCL 2 MG/2ML IJ SOLN
INTRAMUSCULAR | Status: AC
  Filled 2022-10-01: qty 2

## 2022-10-01 MED ORDER — POTASSIUM CHLORIDE CRYS ER 20 MEQ PO TBCR
60.0000 meq | EXTENDED_RELEASE_TABLET | Freq: Once | ORAL | Status: AC
  Administered 2022-10-01: 60 meq via ORAL
  Filled 2022-10-01: qty 3

## 2022-10-01 MED ORDER — ACETAMINOPHEN 650 MG RE SUPP: 650.0000 mg | Freq: Four times a day (QID) | RECTAL | Status: DC | PRN

## 2022-10-01 MED ORDER — ACETAMINOPHEN 500 MG PO TABS
1000.0000 mg | ORAL_TABLET | Freq: Once | ORAL | Status: AC
  Administered 2022-10-01: 1000 mg via ORAL
  Filled 2022-10-01: qty 2

## 2022-10-01 MED ORDER — LACTATED RINGERS IV SOLN: INTRAVENOUS | Status: DC

## 2022-10-01 MED ORDER — MELATONIN 3 MG PO TABS: 3.0000 mg | ORAL_TABLET | Freq: Every evening | ORAL | Status: DC | PRN

## 2022-10-01 MED ORDER — ONDANSETRON HCL 4 MG/2ML IJ SOLN
4.0000 mg | Freq: Once | INTRAMUSCULAR | Status: AC
  Administered 2022-10-01: 4 mg via INTRAVENOUS
  Filled 2022-10-01: qty 2

## 2022-10-01 MED ORDER — SODIUM CHLORIDE 0.9 % IV SOLN
1.0000 g | INTRAVENOUS | Status: DC
  Administered 2022-10-02: 1 g via INTRAVENOUS
  Filled 2022-10-01 (×2): qty 10

## 2022-10-01 MED ORDER — POTASSIUM CHLORIDE 10 MEQ/100ML IV SOLN
10.0000 meq | INTRAVENOUS | Status: AC
  Administered 2022-10-01 (×3): 10 meq via INTRAVENOUS
  Filled 2022-10-01 (×4): qty 100

## 2022-10-01 MED ORDER — ACETAMINOPHEN 325 MG PO TABS: 650.0000 mg | ORAL_TABLET | Freq: Four times a day (QID) | ORAL | Status: DC | PRN

## 2022-10-01 MED ORDER — FENTANYL CITRATE PF 50 MCG/ML IJ SOSY: 25.0000 ug | PREFILLED_SYRINGE | INTRAMUSCULAR | Status: DC | PRN

## 2022-10-01 MED ORDER — FENTANYL CITRATE (PF) 100 MCG/2ML IJ SOLN
INTRAMUSCULAR | Status: AC
  Filled 2022-10-01: qty 2

## 2022-10-01 MED ORDER — LACTATED RINGERS IV BOLUS
1000.0000 mL | Freq: Once | INTRAVENOUS | Status: AC
  Administered 2022-10-01: 1000 mL via INTRAVENOUS

## 2022-10-01 MED ORDER — KETOROLAC TROMETHAMINE 15 MG/ML IJ SOLN
15.0000 mg | Freq: Once | INTRAMUSCULAR | Status: DC
  Filled 2022-10-01: qty 1

## 2022-10-01 MED ORDER — SODIUM CHLORIDE 0.9 % IV SOLN
2.0000 g | Freq: Once | INTRAVENOUS | Status: AC
  Administered 2022-10-01: 2 g via INTRAVENOUS
  Filled 2022-10-01: qty 20

## 2022-10-01 MED ORDER — NALOXONE HCL 0.4 MG/ML IJ SOLN: 0.4000 mg | INTRAMUSCULAR | Status: DC | PRN

## 2022-10-01 SURGICAL SUPPLY — 25 items
BAG URO CATCHER STRL LF (MISCELLANEOUS) ×3 IMPLANT
BASKET ZERO TIP NITINOL 2.4FR (BASKET) IMPLANT
BSKT STON RTRVL ZERO TP 2.4FR (BASKET)
CATH URETL OPEN 5X70 (CATHETERS) ×3 IMPLANT
CLOTH BEACON ORANGE TIMEOUT ST (SAFETY) ×3 IMPLANT
EXTRACTOR STONE 1.7FRX115CM (UROLOGICAL SUPPLIES) IMPLANT
GLOVE BIO SURGEON STRL SZ8 (GLOVE) ×1 IMPLANT
GLOVE BIOGEL PI IND STRL 8 (GLOVE) ×1 IMPLANT
GLOVE SURG LX STRL 7.5 STRW (GLOVE) ×3 IMPLANT
GOWN STRL REUS W/ TWL XL LVL3 (GOWN DISPOSABLE) ×4 IMPLANT
GOWN STRL REUS W/TWL XL LVL3 (GOWN DISPOSABLE) ×4
GUIDEWIRE ANG ZIPWIRE 038X150 (WIRE) IMPLANT
GUIDEWIRE STR DUAL SENSOR (WIRE) ×3 IMPLANT
KIT TURNOVER KIT A (KITS) IMPLANT
LASER FIB FLEXIVA PULSE ID 365 (Laser) IMPLANT
MANIFOLD NEPTUNE II (INSTRUMENTS) ×3 IMPLANT
PACK CYSTO (CUSTOM PROCEDURE TRAY) ×3 IMPLANT
SHEATH NAVIGATOR HD 12/14X28 (SHEATH) IMPLANT
SHEATH NAVIGATOR HD 12/14X36 (SHEATH) IMPLANT
STENT URET 6FRX24 CONTOUR (STENTS) ×1 IMPLANT
SYR CONTROL 10ML LL (SYRINGE) ×1 IMPLANT
TRACTIP FLEXIVA PULS ID 200XHI (Laser) IMPLANT
TRACTIP FLEXIVA PULSE ID 200 (Laser)
TUBING CONNECTING 10 (TUBING) ×3 IMPLANT
TUBING UROLOGY SET (TUBING) ×3 IMPLANT
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
## 2022-10-01 NOTE — ED Notes (Signed)
Called [facility] for transport (to [facility] ED

## 2022-10-01 NOTE — ED Notes (Signed)
Patient transported to CT 

## 2022-10-01 NOTE — ED Provider Notes (Signed)
[facility] Provider Note   CSN: [id number] Arrival date & time: 10/01/22  1723     History  Chief Complaint  Patient presents with   Fever   Flank Pain    Jill Patel is a 27 y.o. female.  With PMH of asthma, anemia, anxiety who represents for the third time since Friday with ongoing symptoms from kidney stone diagnosed this past Friday.  She complains of new fever today with Tmax of 101.4 F.  She feels like pain has been somewhat well-controlled with Tylenol, ibuprofen and as needed pain medications.  She has had significantly decreased appetite she has been trying to keep her fluid intake up but she has been eating less.  She denies any pain with urination.  No blood with urination does not feel that she has passed the stone yet.  She has had no cough, no sore throat, no congestion or rhinorrhea or diarrhea.   Fever Flank Pain       Home Medications Prior to Admission medications   Medication Sig Start Date End Date Taking? Authorizing Provider  acetaminophen (TYLENOL) 325 MG tablet Take 2 tablets (650 mg total) by mouth every 4 (four) hours as needed (for pain scale < 4). 04/18/22   Mercado-Ortiz, Ernestine Conrad, DO  benzocaine-Menthol (DERMOPLAST) 20-0.5 % AERO Apply 1 Application topically as needed for irritation (perineal discomfort). Patient not taking: Reported on 06/06/2022 04/18/22   Shelda Pal, DO  HYDROcodone-acetaminophen (NORCO/VICODIN) 5-325 MG tablet Take 1 tablet by mouth every 6 (six) hours as needed for severe pain. 09/27/22   Azucena Cecil, PA-C  ibuprofen (ADVIL) 600 MG tablet Take 1 tablet (600 mg total) by mouth every 6 (six) hours. Patient not taking: Reported on 06/06/2022 04/18/22   Mercado-Ortiz, Ernestine Conrad, DO  Iron, Ferrous Sulfate, 325 (65 Fe) MG TABS Take 1 tablet by mouth every other day. Patient not taking: Reported on 02/27/2022 01/29/22   Renard Matter, MD  ondansetron (ZOFRAN) 4 MG  tablet Take 1 tablet (4 mg total) by mouth every 6 (six) hours as needed for nausea or vomiting. 09/27/22   Azucena Cecil, PA-C  oxyCODONE (ROXICODONE) 5 MG immediate release tablet Take 1 tablet (5 mg total) by mouth every 4 (four) hours as needed for severe pain. 07/17/22   Fransico Meadow, MD  senna-docusate (SENOKOT-S) 8.6-50 MG tablet Take 2 tablets by mouth daily. Patient not taking: Reported on 06/06/2022 04/18/22   Shelda Pal, DO  tamsulosin (FLOMAX) 0.4 MG CAPS capsule Take 1 capsule (0.4 mg total) by mouth daily after breakfast. 09/27/22   Azucena Cecil, PA-C      Allergies    Kiwi extract, Aleve [naproxen sodium], Mango flavor, Other, and Pineapple    Review of Systems   Review of Systems  Constitutional:  Positive for fever.  Genitourinary:  Positive for flank pain.    Physical Exam Updated Vital Signs BP (!) 129/94   Pulse 98   Temp 99.7 F (37.6 C)   Resp 15   Ht '5\' 3"'$  (1.6 m)   Wt 52.3 kg   LMP 07/02/2021 (Approximate)   SpO2 99%   Breastfeeding Yes   BMI 20.44 kg/m  Physical Exam Constitutional: Alert and oriented.  Nontoxic, NAD Eyes: Conjunctivae are normal. ENT      Head: Normocephalic and atraumatic. Cardiovascular: S1, S2, regular rate Respiratory: Normal respiratory effort. Breath sounds are normal.  O2 sat 98 on RA Gastrointestinal: Soft and suprapubic  tenderness no rebound or guarding, mild left CVA tenderness Musculoskeletal: Normal range of motion in all extremities. Neurologic: Normal speech and language. No gross focal neurologic deficits are appreciated. Skin: Skin is warm, dry and intact. No rash noted. Psychiatric: Mood and affect are normal. Speech and behavior are normal.  ED Results / Procedures / Treatments   Labs (all labs ordered are listed, but only abnormal results are displayed) Labs Reviewed  COMPREHENSIVE METABOLIC PANEL - Abnormal; Notable for the following components:      Result Value   Sodium 131  (*)    Potassium 2.5 (*)    Calcium 8.2 (*)    All other components within normal limits  CBC WITH DIFFERENTIAL/PLATELET - Abnormal; Notable for the following components:   WBC 12.7 (*)    RBC 3.73 (*)    Hemoglobin 11.2 (*)    HCT 32.3 (*)    Neutro Abs 9.6 (*)    Monocytes Absolute 1.4 (*)    All other components within normal limits  URINALYSIS, ROUTINE W REFLEX MICROSCOPIC - Abnormal; Notable for the following components:   APPearance CLOUDY (*)    Hgb urine dipstick MODERATE (*)    Ketones, ur 15 (*)    Leukocytes,Ua MODERATE (*)    All other components within normal limits  URINALYSIS, MICROSCOPIC (REFLEX) - Abnormal; Notable for the following components:   Bacteria, UA FEW (*)    All other components within normal limits  PREGNANCY, URINE  MAGNESIUM    EKG EKG Interpretation  Date/Time:  Tuesday October 01 2022 18:43:02 EDT Ventricular Rate:  76 PR Interval:  131 QRS Duration: 77 QT Interval:  485 QTC Calculation: 546 R Axis:   85 Text Interpretation: Sinus rhythm Borderline T abnormalities, anterior leads Prolonged QT interval Confirmed by Georgina Snell [phone]) on 10/01/2022 6:46:25 PM  Radiology CT Renal Stone Study  Result Date: 10/01/2022 CLINICAL DATA:  Worsening pain with history of recent stone EXAM: CT ABDOMEN AND PELVIS WITHOUT CONTRAST TECHNIQUE: Multidetector CT imaging of the abdomen and pelvis was performed following the standard protocol without IV contrast. RADIATION DOSE REDUCTION: This exam was performed according to the departmental dose-optimization program which includes automated exposure control, adjustment of the mA and/or kV according to patient size and/or use of iterative reconstruction technique. COMPARISON:  09/27/2022 FINDINGS: Lower chest: No acute abnormality. Hepatobiliary: Unremarkable liver, gallbladder, and biliary tree. Pancreas: Unremarkable. Spleen: Unremarkable. Adrenals/Urinary Tract: Normal adrenal glands. Bilateral  nonobstructing calyceal stones measuring up to 10 mm in the left kidney. Unchanged mild left hydroureteronephrosis upstream from a 5 mm calculus at the left UVJ. Slight asymmetric increase in perinephric stranding about the left kidney. No obstructing right ureteral calculi or right hydronephrosis. Unremarkable bladder. Stomach/Bowel: Normal caliber large and small bowel. No bowel wall thickening. Unremarkable stomach. Vascular/Lymphatic: No significant vascular findings are present. No enlarged abdominal or pelvic lymph nodes. Reproductive: Uterus and bilateral adnexa are unremarkable. Other: Small volume free fluid in the pelvis. No free intraperitoneal air. Musculoskeletal: No acute osseous abnormality. IMPRESSION: 1. Left hydroureteronephrosis upstream from a 5 mm calculus at the left UVJ. Slight asymmetric increase in perinephric stranding about the left kidney may be due to obstruction or pyelonephritis. 2. Bilateral nonobstructing calyceal stones measuring up to 10 mm in the left kidney. 3. Small amount of free fluid in the pelvis. Electronically Signed   By: Placido Sou M.D.   On: 10/01/2022 19:44    Procedures .Critical Care  Performed by: Elgie Congo, MD Authorized by: Nechama Guard,  Valda Lamb, MD   Critical care provider statement:    Critical care time (minutes):  40   Critical care was necessary to treat or prevent imminent or life-threatening deterioration of the following conditions:  Metabolic crisis   Critical care was time spent personally by me on the following activities:  Development of treatment plan with patient or surrogate, discussions with consultants, evaluation of patient's response to treatment, examination of patient, ordering and review of laboratory studies, ordering and review of radiographic studies, ordering and performing treatments and interventions, pulse oximetry, re-evaluation of patient's condition, review of old charts and obtaining history from patient or  surrogate   Care discussed with: admitting provider       Medications Ordered in ED Medications  potassium chloride 10 mEq in 100 mL IVPB (10 mEq Intravenous New Bag/Given 10/01/22 1905)  ketorolac (TORADOL) 15 MG/ML injection 15 mg (15 mg Intravenous Not Given 10/01/22 1911)  lactated ringers bolus 1,000 mL ( Intravenous Rate/Dose Change 10/01/22 1922)  lactated ringers bolus 1,000 mL (1,000 mLs Intravenous New Bag/Given 10/01/22 1903)  potassium chloride SA (KLOR-CON M) CR tablet 60 mEq (60 mEq Oral Given 10/01/22 1911)  cefTRIAXone (ROCEPHIN) 2 g in sodium chloride 0.9 % 100 mL IVPB (0 g Intravenous Stopped 10/01/22 1954)  ondansetron (ZOFRAN) injection 4 mg (4 mg Intravenous Given 10/01/22 1908)    ED Course/ Medical Decision Making/ A&P Clinical Course as of 10/01/22 2058  Tue Oct 01, 2022  1955 I personally reviewed patient's CT scan.  There is significant hydroureteronephrosis on the left side with left-sided stone.  Measured by radiologist 5 mm with associated hydroureteronephrosis as well as findings concerning for superimposed pyelonephritis.  Urology consulted. [VB]  2023 S/ Dr Louis Meckel of urology requesting transfer to [facility] long ED where she can be evaluated by urology and likely admitted with plans for stent either tonight or tomorrow. [VB]  2028 Accepted by Dr Maylon Peppers for transfer to [facility] to be evaluated by urology. [VB]    Clinical Course User Index [VB] Elgie Congo, MD   {                            Medical Decision Making Jill Patel is a 27 y.o. female.  With PMH of asthma, anemia, anxiety who represents for the third time since Friday with ongoing symptoms from kidney stone diagnosed this past Friday.  She complains of new fever today with Tmax of 101.4 F.  Labs reviewed by me concerning for hyponatremia 131 and hypokalemia 2.5. Cr 0.9 wnl.  Prolonged QTc on EKG, 546.  Started patient on IV potassium repletion and p.o. potassium.  UA concerning  for infection with moderate leukocyte esterase few bacteria and 11-20 WBCs with 6-10 RBCs.  No squames present.  I personally reviewed patient's CT scan.  There is significant hydroureteronephrosis on the left side with left-sided stone.  Measured by radiologist 5 mm with associated hydroureteronephrosis as well as findings concerning for superimposed pyelonephritis.  Urology consulted. [VB]  S/ Dr Louis Meckel of urology requesting transfer to [facility] long ED where she can be evaluated by urology and likely admitted with plans for stent either tonight or tomorrow. [VB]  Accepted by Dr Maylon Peppers for transfer to [facility] to be evaluated by urology. [VB]   Amount and/or Complexity of Data Reviewed Labs: ordered. Radiology: ordered.  Risk Prescription drug management.    Final Clinical Impression(s) / ED Diagnoses Final diagnoses:  Hypokalemia  Kidney stone  Pyelonephritis    Rx / DC Orders ED Discharge Orders     None         Elgie Congo, MD 10/01/22 [phone]

## 2022-10-01 NOTE — ED Notes (Signed)
Report given to [facility] 

## 2022-10-01 NOTE — ED Notes (Signed)
Rate changed due to burning

## 2022-10-01 NOTE — H&P (Signed)
History and Physical      Jill Patel [id number] DOB: 04-01-1996 DOA: 10/01/2022  PCP: Pcp, No *** Patient coming from: home ***  I have personally briefly reviewed patient's old medical records in [place]  Chief Complaint: ***  HPI: Jill Patel is a 27 y.o. female with medical history significant for *** who is admitted to [facility] on 10/01/2022 with *** after presenting from home*** to [facility] ED complaining of ***.   ***        ***  ED Course:  Vital signs in the ED were notable for the following: ***  Labs were notable for the following: ***  Per my interpretation, EKG in ED demonstrated the following:  ***  Imaging and additional notable ED work-up: ***  While in the ED, the following were administered: ***  Subsequently, the patient was admitted  ***  ***red   Review of Systems: As per HPI otherwise 10 point review of systems negative.   Past Medical History:  Diagnosis Date   Anemia    Anxiety    Asthma    Depression    never on meds   Headache    Heart murmur    at birth   Kidney cysts    Ovarian cyst    Scoliosis     Past Surgical History:  Procedure Laterality Date   TONSILLECTOMY      Social History:  reports that she has quit smoking. Her smoking use included cigars. She has never used smokeless tobacco. She reports current alcohol use. She reports that she does not use drugs.   Allergies  Allergen Reactions   Kiwi Extract Anaphylaxis   Aleve [Naproxen Sodium]     Stomach bleeds   Mango Flavor     Scratchy throat   Other     Eggplant - scratchy throat    Pineapple     Scratchy throat     Family History  Problem Relation Age of Onset   Healthy Mother    Healthy Father    Diabetes Maternal Grandfather    Heart disease Maternal Grandfather    Cancer Maternal Grandfather     Family history reviewed and not pertinent ***   Prior to Admission medications   Medication Sig Start Date End  Date Taking? Authorizing Provider  acetaminophen (TYLENOL) 325 MG tablet Take 2 tablets (650 mg total) by mouth every 4 (four) hours as needed (for pain scale < 4). 04/18/22   Mercado-Ortiz, Ernestine Conrad, DO  benzocaine-Menthol (DERMOPLAST) 20-0.5 % AERO Apply 1 Application topically as needed for irritation (perineal discomfort). Patient not taking: Reported on 06/06/2022 04/18/22   Shelda Pal, DO  HYDROcodone-acetaminophen (NORCO/VICODIN) 5-325 MG tablet Take 1 tablet by mouth every 6 (six) hours as needed for severe pain. 09/27/22   Azucena Cecil, PA-C  ibuprofen (ADVIL) 600 MG tablet Take 1 tablet (600 mg total) by mouth every 6 (six) hours. Patient not taking: Reported on 06/06/2022 04/18/22   Mercado-Ortiz, Ernestine Conrad, DO  Iron, Ferrous Sulfate, 325 (65 Fe) MG TABS Take 1 tablet by mouth every other day. Patient not taking: Reported on 02/27/2022 01/29/22   Renard Matter, MD  ondansetron (ZOFRAN) 4 MG tablet Take 1 tablet (4 mg total) by mouth every 6 (six) hours as needed for nausea or vomiting. 09/27/22   Azucena Cecil, PA-C  oxyCODONE (ROXICODONE) 5 MG immediate release tablet Take 1 tablet (5 mg total) by mouth every 4 (four) hours as needed for severe  pain. 07/17/22   Fransico Meadow, MD  senna-docusate (SENOKOT-S) 8.6-50 MG tablet Take 2 tablets by mouth daily. Patient not taking: Reported on 06/06/2022 04/18/22   Shelda Pal, DO  tamsulosin [facility]) 0.4 MG CAPS capsule Take 1 capsule (0.4 mg total) by mouth daily after breakfast. 09/27/22   Azucena Cecil, PA-C     Objective    Physical Exam: Vitals:   10/01/22 2130 10/01/22 2200 10/01/22 2215 10/01/22 2230  BP: (!) 144/81 (!) 140/81  (!) 148/90  Pulse: 90 90  84  Resp: 19 17  (!) 21  Temp:   (S) (!) 102.9 F (39.4 C)   TempSrc:   Oral   SpO2: 100% 100%  99%  Weight:      Height:        General: appears to be stated age; alert, oriented Skin: warm, dry, no rash Head:  AT/[place] Mouth:  Oral  mucosa membranes appear moist, normal dentition Neck: supple; trachea midline Heart:  RRR; did not appreciate any M/R/G Lungs: CTAB, did not appreciate any wheezes, rales, or rhonchi Abdomen: + BS; soft, ND, NT Vascular: 2+ pedal pulses b/l; 2+ radial pulses b/l Extremities: no peripheral edema, no muscle wasting Neuro: strength and sensation intact in upper and lower extremities b/l    *** Neuro: 5/5 strength of the proximal and distal flexors and extensors of the upper and lower extremities bilaterally; sensation intact in upper and lower extremities b/l; cranial nerves II through XII grossly intact; no pronator drift; no evidence suggestive of slurred speech, dysarthria, or facial droop; Normal muscle tone. No tremors. *** Neuro: In the setting of the patient's current mental status and associated inability to follow instructions, unable to perform full neurologic exam at this time.  As such, assessment of strength, sensation, and cranial nerves is limited at this time. Patient noted to spontaneously move all 4 extremities. No tremors.  ***    Labs on Admission: I have personally reviewed following labs and imaging studies  CBC: Recent Labs  Lab 09/27/22 2031 09/27/22 2132 09/30/22 0946 10/01/22 1736  WBC 12.8*  --  9.1 12.7*  NEUTROABS 11.7*  --   --  9.6*  HGB 12.1 12.6 11.5* 11.2*  HCT 37.1 37.0 34.8* 32.3*  MCV 91.6  --  92.1 86.6  PLT 241  --  221 [id number]   Basic Metabolic Panel: Recent Labs  Lab 09/27/22 2031 09/27/22 2132 09/30/22 0946 10/01/22 1736 10/01/22 1840  NA 143 142 140 131*  --   K 4.0 4.0 3.3* 2.5*  --   CL 107 108 107 99  --   CO2 17*  --  21* 22  --   GLUCOSE 102* 96 81 96  --   BUN '11 12 13 9  '$ --   CREATININE 0.82 0.60 0.93 0.90  --   CALCIUM 9.9  --  9.1 8.2*  --   MG  --   --   --   --  1.7   GFR: Estimated Creatinine Clearance: 79.6 mL/min (by C-G formula based on SCr of 0.9 mg/dL). Liver Function Tests: Recent Labs  Lab 09/27/22 2031  10/01/22 1736  AST 25 16  ALT 18 12  ALKPHOS 81 76  BILITOT 0.8 1.0  PROT 7.7 7.3  ALBUMIN 4.8 4.1   Recent Labs  Lab 09/27/22 2031  LIPASE 29   No results for input(s): "AMMONIA" in the last 168 hours. Coagulation Profile: No results for input(s): "INR", "PROTIME" in the last  168 hours. Cardiac Enzymes: No results for input(s): "CKTOTAL", "CKMB", "CKMBINDEX", "TROPONINI" in the last 168 hours. BNP (last 3 results) No results for input(s): "PROBNP" in the last 8760 hours. HbA1C: No results for input(s): "HGBA1C" in the last 72 hours. CBG: No results for input(s): "GLUCAP" in the last 168 hours. Lipid Profile: No results for input(s): "CHOL", "HDL", "LDLCALC", "TRIG", "CHOLHDL", "LDLDIRECT" in the last 72 hours. Thyroid Function Tests: No results for input(s): "TSH", "T4TOTAL", "FREET4", "T3FREE", "THYROIDAB" in the last 72 hours. Anemia Panel: No results for input(s): "VITAMINB12", "FOLATE", "FERRITIN", "TIBC", "IRON", "RETICCTPCT" in the last 72 hours. Urine analysis:    Component Value Date/Time   COLORURINE YELLOW 10/01/2022 1736   APPEARANCEUR CLOUDY (A) 10/01/2022 1736   LABSPEC <=1.005 10/01/2022 1736   PHURINE 6.0 10/01/2022 1736   GLUCOSEU NEGATIVE 10/01/2022 1736   HGBUR MODERATE (A) 10/01/2022 1736   BILIRUBINUR NEGATIVE 10/01/2022 1736   KETONESUR 15 (A) 10/01/2022 1736   PROTEINUR NEGATIVE 10/01/2022 1736   NITRITE NEGATIVE 10/01/2022 1736   LEUKOCYTESUR MODERATE (A) 10/01/2022 1736    Radiological Exams on Admission: CT Renal Stone Study  Result Date: 10/01/2022 CLINICAL DATA:  Worsening pain with history of recent stone EXAM: CT ABDOMEN AND PELVIS WITHOUT CONTRAST TECHNIQUE: Multidetector CT imaging of the abdomen and pelvis was performed following the standard protocol without IV contrast. RADIATION DOSE REDUCTION: This exam was performed according to the departmental dose-optimization program which includes automated exposure control, adjustment of  the mA and/or kV according to patient size and/or use of iterative reconstruction technique. COMPARISON:  09/27/2022 FINDINGS: Lower chest: No acute abnormality. Hepatobiliary: Unremarkable liver, gallbladder, and biliary tree. Pancreas: Unremarkable. Spleen: Unremarkable. Adrenals/Urinary Tract: Normal adrenal glands. Bilateral nonobstructing calyceal stones measuring up to 10 mm in the left kidney. Unchanged mild left hydroureteronephrosis upstream from a 5 mm calculus at the left UVJ. Slight asymmetric increase in perinephric stranding about the left kidney. No obstructing right ureteral calculi or right hydronephrosis. Unremarkable bladder. Stomach/Bowel: Normal caliber large and small bowel. No bowel wall thickening. Unremarkable stomach. Vascular/Lymphatic: No significant vascular findings are present. No enlarged abdominal or pelvic lymph nodes. Reproductive: Uterus and bilateral adnexa are unremarkable. Other: Small volume free fluid in the pelvis. No free intraperitoneal air. Musculoskeletal: No acute osseous abnormality. IMPRESSION: 1. Left hydroureteronephrosis upstream from a 5 mm calculus at the left UVJ. Slight asymmetric increase in perinephric stranding about the left kidney may be due to obstruction or pyelonephritis. 2. Bilateral nonobstructing calyceal stones measuring up to 10 mm in the left kidney. 3. Small amount of free fluid in the pelvis. Electronically Signed   By: Placido Sou M.D.   On: 10/01/2022 19:44      Assessment/Plan    Principal Problem:   Kidney stone on left side  ***      ***          ***           ***          ***          ***          ***          ***          ***          ***          ***          ***          ***          ***  DVT prophylaxis: SCD's ***  Code Status: Full code*** Family Communication: none*** Disposition  Plan: Per Rounding Team Consults called: none***;  Admission status: ***    I SPENT GREATER THAN 75 *** MINUTES IN CLINICAL CARE TIME/MEDICAL DECISION-MAKING IN COMPLETING THIS ADMISSION.     [place] DO [facility] [place]   10/01/2022, 11:39 PM   ***

## 2022-10-01 NOTE — ED Provider Notes (Signed)
11:15 PM Patient care assumed in transfer from [facility].  Patient with sepsis secondary to pyelonephritis and obstructing kidney stone.  Dr. Louis Meckel of urology is in the ED and is assessing patient at this time.  I have also assessed the patient and she is well-appearing.  She has no acute complaints at this time.  Was hemodynamically stable in transfer from [facility]. Plan for admission to hospitalist service with Urology on board.  Patient transported to [place] via [employer] operational officer. Consult placed to [facility].  11:34 PM Case discussed with Dr. Velia Meyer of [facility] who will admit.   Antonietta Breach, PA-C 10/01/22 2334    Margette Fast, MD 10/02/22 [phone]

## 2022-10-01 NOTE — ED Triage Notes (Signed)
Reports dx with kidney stone on Friday. Reports ongoing left flank pain, chills, and intermittent fevers. Highest recorded home temp 101.46F. Took tylenol at 1555.

## 2022-10-01 NOTE — Anesthesia Preprocedure Evaluation (Addendum)
Anesthesia Evaluation  Patient identified by MRN, date of birth, ID band Patient awake    Reviewed: Allergy & Precautions, NPO status , Patient's Chart, lab work & pertinent test results  Airway Mallampati: II  TM Distance: >3 FB Neck ROM: Full    Dental no notable dental hx.    Pulmonary asthma , former smoker   Pulmonary exam normal        Cardiovascular Normal cardiovascular exam+ Valvular Problems/Murmurs      Neuro/Psych  Headaches PSYCHIATRIC DISORDERS Anxiety Depression       GI/Hepatic negative GI ROS, Neg liver ROS,,,  Endo/Other    Renal/GU Renal disease     Musculoskeletal negative musculoskeletal ROS (+)    Abdominal   Peds  Hematology  (+) Blood dyscrasia, anemia   Anesthesia Other Findings Left Infected Stone  Reproductive/Obstetrics Hcg negative                             Anesthesia Physical Anesthesia Plan  ASA: 2 and emergent  Anesthesia Plan: General   Post-op Pain Management:    Induction: Intravenous  PONV Risk Score and Plan: 4 or greater and Ondansetron, Dexamethasone, Midazolam and Treatment may vary due to age or medical condition  Airway Management Planned: LMA  Additional Equipment:   Intra-op Plan:   Post-operative Plan: Extubation in OR  Informed Consent: I have reviewed the patients History and Physical, chart, labs and discussed the procedure including the risks, benefits and alternatives for the proposed anesthesia with the patient or authorized representative who has indicated his/her understanding and acceptance.     Dental advisory given  Plan Discussed with: CRNA  Anesthesia Plan Comments:         Anesthesia Quick Evaluation

## 2022-10-02 ENCOUNTER — Emergency Department (HOSPITAL_COMMUNITY): Payer: Medicaid Other | Admitting: Anesthesiology

## 2022-10-02 ENCOUNTER — Encounter (HOSPITAL_COMMUNITY): Payer: Self-pay | Admitting: Internal Medicine

## 2022-10-02 ENCOUNTER — Inpatient Hospital Stay (HOSPITAL_COMMUNITY): Payer: Medicaid Other

## 2022-10-02 DIAGNOSIS — N201 Calculus of ureter: Secondary | ICD-10-CM | POA: Diagnosis not present

## 2022-10-02 DIAGNOSIS — A419 Sepsis, unspecified organism: Secondary | ICD-10-CM | POA: Diagnosis present

## 2022-10-02 DIAGNOSIS — E876 Hypokalemia: Principal | ICD-10-CM | POA: Insufficient documentation

## 2022-10-02 DIAGNOSIS — R109 Unspecified abdominal pain: Secondary | ICD-10-CM | POA: Insufficient documentation

## 2022-10-02 DIAGNOSIS — R739 Hyperglycemia, unspecified: Secondary | ICD-10-CM | POA: Diagnosis present

## 2022-10-02 DIAGNOSIS — N12 Tubulo-interstitial nephritis, not specified as acute or chronic: Secondary | ICD-10-CM | POA: Diagnosis not present

## 2022-10-02 DIAGNOSIS — R9431 Abnormal electrocardiogram [ECG] [EKG]: Secondary | ICD-10-CM | POA: Diagnosis present

## 2022-10-02 LAB — COMPREHENSIVE METABOLIC PANEL
ALT: 16 U/L (ref 0–44)
AST: 19 U/L (ref 15–41)
Albumin: 3.9 g/dL (ref 3.5–5.0)
Alkaline Phosphatase: 73 U/L (ref 38–126)
Anion gap: 9 (ref 5–15)
BUN: 9 mg/dL (ref 6–20)
CO2: 22 mmol/L (ref 22–32)
Calcium: 8.5 mg/dL — ABNORMAL LOW (ref 8.9–10.3)
Chloride: 103 mmol/L (ref 98–111)
Creatinine, Ser: 0.74 mg/dL (ref 0.44–1.00)
GFR, Estimated: >60 mL/min (ref >60)
Glucose, Bld: 217 mg/dL — ABNORMAL HIGH (ref 70–99)
Potassium: 3.7 mmol/L (ref 3.5–5.1)
Sodium: 134 mmol/L — ABNORMAL LOW (ref 135–145)
Total Bilirubin: 0.7 mg/dL (ref 0.3–1.2)
Total Protein: 7.1 g/dL (ref 6.5–8.1)

## 2022-10-02 LAB — CBC WITH DIFFERENTIAL/PLATELET
Abs Immature Granulocytes: 0.07 10*3/uL (ref 0.00–0.07)
Basophils Absolute: 0 10*3/uL (ref 0.0–0.1)
Basophils Relative: 0 %
Eosinophils Absolute: 0 10*3/uL (ref 0.0–0.5)
Eosinophils Relative: 0 %
HCT: 34.7 % — ABNORMAL LOW (ref 36.0–46.0)
Hemoglobin: 11.7 g/dL — ABNORMAL LOW (ref 12.0–15.0)
Immature Granulocytes: 1 %
Lymphocytes Relative: 6 %
Lymphs Abs: 0.8 10*3/uL (ref 0.7–4.0)
MCH: 30.2 pg (ref 26.0–34.0)
MCHC: 33.7 g/dL (ref 30.0–36.0)
MCV: 89.4 fL (ref 80.0–100.0)
Monocytes Absolute: 0.4 10*3/uL (ref 0.1–1.0)
Monocytes Relative: 3 %
Neutro Abs: 13.8 10*3/uL — ABNORMAL HIGH (ref 1.7–7.7)
Neutrophils Relative %: 90 %
Platelets: 225 10*3/uL (ref 150–400)
RBC: 3.88 MIL/uL (ref 3.87–5.11)
RDW: 11.8 % (ref 11.5–15.5)
WBC: 15.1 10*3/uL — ABNORMAL HIGH (ref 4.0–10.5)
nRBC: 0 % (ref 0.0–0.2)

## 2022-10-02 LAB — LACTIC ACID, PLASMA: Lactic Acid, Venous: 0.8 mmol/L (ref 0.5–1.9)

## 2022-10-02 LAB — MAGNESIUM: Magnesium: 1.8 mg/dL (ref 1.7–2.4)

## 2022-10-02 MED ORDER — IBUPROFEN 200 MG PO TABS
600.0000 mg | ORAL_TABLET | Freq: Four times a day (QID) | ORAL | Status: DC
  Administered 2022-10-02 – 2022-10-03 (×3): 600 mg via ORAL
  Filled 2022-10-02 (×6): qty 3

## 2022-10-02 MED ORDER — OXYCODONE HCL 5 MG PO TABS: 5.0000 mg | ORAL_TABLET | ORAL | Status: DC | PRN

## 2022-10-02 MED ORDER — IRON (FERROUS SULFATE) 325 (65 FE) MG PO TABS: 1.0000 | ORAL_TABLET | ORAL | Status: DC

## 2022-10-02 MED ORDER — OXYCODONE HCL 5 MG/5ML PO SOLN: 5.0000 mg | Freq: Once | ORAL | Status: DC | PRN

## 2022-10-02 MED ORDER — AMISULPRIDE (ANTIEMETIC) 5 MG/2ML IV SOLN: 10.0000 mg | Freq: Once | INTRAVENOUS | Status: DC | PRN

## 2022-10-02 MED ORDER — SODIUM CHLORIDE 0.9 % IR SOLN
Status: DC | PRN
  Administered 2022-10-02: 3000 mL

## 2022-10-02 MED ORDER — PROMETHAZINE HCL 25 MG/ML IJ SOLN: 6.2500 mg | INTRAMUSCULAR | Status: DC | PRN

## 2022-10-02 MED ORDER — ONDANSETRON HCL 4 MG/2ML IJ SOLN
INTRAMUSCULAR | Status: DC | PRN
  Administered 2022-10-02: 4 mg via INTRAVENOUS

## 2022-10-02 MED ORDER — FENTANYL CITRATE PF 50 MCG/ML IJ SOSY
PREFILLED_SYRINGE | INTRAMUSCULAR | Status: AC
  Filled 2022-10-02: qty 1

## 2022-10-02 MED ORDER — LORAZEPAM 2 MG/ML IJ SOLN: 0.5000 mg | Freq: Four times a day (QID) | INTRAMUSCULAR | Status: DC | PRN

## 2022-10-02 MED ORDER — SODIUM CHLORIDE 0.9 % IR SOLN
Status: DC | PRN
  Administered 2022-10-02: 1000 mL

## 2022-10-02 MED ORDER — ONDANSETRON HCL 4 MG PO TABS: 4.0000 mg | ORAL_TABLET | Freq: Four times a day (QID) | ORAL | Status: DC | PRN

## 2022-10-02 MED ORDER — FERROUS SULFATE 325 (65 FE) MG PO TABS
325.0000 mg | ORAL_TABLET | Freq: Every day | ORAL | Status: DC
  Administered 2022-10-02 – 2022-10-03 (×2): 325 mg via ORAL
  Filled 2022-10-02 (×2): qty 1

## 2022-10-02 MED ORDER — LIDOCAINE 2% (20 MG/ML) 5 ML SYRINGE
INTRAMUSCULAR | Status: DC | PRN
  Administered 2022-10-02: 60 mg via INTRAVENOUS

## 2022-10-02 MED ORDER — SENNOSIDES-DOCUSATE SODIUM 8.6-50 MG PO TABS
2.0000 | ORAL_TABLET | Freq: Every day | ORAL | Status: DC
  Administered 2022-10-02 – 2022-10-03 (×2): 2 via ORAL
  Filled 2022-10-02 (×2): qty 2

## 2022-10-02 MED ORDER — MIDAZOLAM HCL 5 MG/5ML IJ SOLN
INTRAMUSCULAR | Status: DC | PRN
  Administered 2022-10-01: 1 mg via INTRAVENOUS

## 2022-10-02 MED ORDER — OXYCODONE HCL 5 MG PO TABS: 5.0000 mg | ORAL_TABLET | Freq: Once | ORAL | Status: DC | PRN

## 2022-10-02 MED ORDER — FENTANYL CITRATE PF 50 MCG/ML IJ SOSY
25.0000 ug | PREFILLED_SYRINGE | INTRAMUSCULAR | Status: DC | PRN
  Administered 2022-10-02: 25 ug via INTRAVENOUS

## 2022-10-02 MED ORDER — HYDROCODONE-ACETAMINOPHEN 5-325 MG PO TABS: 1.0000 | ORAL_TABLET | Freq: Four times a day (QID) | ORAL | Status: DC | PRN

## 2022-10-02 MED ORDER — ACETAMINOPHEN 325 MG PO TABS: 650.0000 mg | ORAL_TABLET | ORAL | Status: DC | PRN

## 2022-10-02 MED ORDER — DEXAMETHASONE SODIUM PHOSPHATE 4 MG/ML IJ SOLN
INTRAMUSCULAR | Status: DC | PRN
  Administered 2022-10-02: 8 mg via INTRAVENOUS

## 2022-10-02 MED ORDER — FENTANYL CITRATE (PF) 100 MCG/2ML IJ SOLN
INTRAMUSCULAR | Status: DC | PRN
  Administered 2022-10-02: 25 ug via INTRAVENOUS

## 2022-10-02 MED ORDER — PROPOFOL 10 MG/ML IV BOLUS
INTRAVENOUS | Status: DC | PRN
  Administered 2022-10-02: 200 mg via INTRAVENOUS

## 2022-10-02 MED ORDER — IOHEXOL 300 MG/ML  SOLN
INTRAMUSCULAR | Status: DC | PRN
  Administered 2022-10-02: 1 mL via URETHRAL

## 2022-10-02 NOTE — Assessment & Plan Note (Signed)
-  Patient was found to have obstructing L ureterolithiasis with a 16mm calculus at the UV junction -Urology took the patient to the OR overnight and performed cystoscopy/ureteroscopy and placed a L ureteral stent

## 2022-10-02 NOTE — Assessment & Plan Note (Signed)
-  Likely associated with dehydration, anticipate resolution  -Awaiting repeat EKG - if normal QTc can dc telemetry -Will attempt to avoid QT-prolonging medications such as PPI, nausea meds, SSRIs for now

## 2022-10-02 NOTE — Op Note (Signed)
Preoperative diagnosis:  Left infected ureteral stone   Postoperative diagnosis:  same   Procedure:  Cystoscopy left ureteral stent placement left retrograde pyelography with interpretation   Surgeon: Ardis Hughs, MD  Anesthesia: General  Complications: None  Intraoperative findings:  left retrograde pyelography demonstrated a filling defect within the left ureter consistent with the patient's known calculus without other abnormalities.  EBL: Minimal  Specimens: Urine culture  Indication: Jill Patel is a 27 y.o. patient with fever and obstructing left distal ureteral stone. After reviewing the management options for treatment, he elected to proceed with the above surgical procedure(s). We have discussed the potential benefits and risks of the procedure, side effects of the proposed treatment, the likelihood of the patient achieving the goals of the procedure, and any potential problems that might occur during the procedure or recuperation. Informed consent has been obtained.  Description of procedure:  The patient was taken to the operating room and general anesthesia was induced.  The patient was placed in the dorsal lithotomy position, prepped and draped in the usual sterile fashion, and preoperative antibiotics were administered. A preoperative time-out was performed.   Cystourethroscopy was performed.  The patient's urethra was examined and was normal. The bladder was then systematically examined in its entirety. There was no evidence for any bladder tumors, stones, or other mucosal pathology.    Attention then turned to the leftureteral orifice and a ureteral catheter was used to intubate the ureteral orifice.  Omnipaque contrast was injected through the ureteral catheter and a retrograde pyelogram was performed with findings as dictated above.  A 0.38 sensor guidewire was then advanced up the left ureter into the renal pelvis under fluoroscopic guidance.  The  wire was then backloaded through the cystoscope and a ureteral stent was advance over the wire using Seldinger technique.  The stent was positioned appropriately under fluoroscopic and cystoscopic guidance.  The wire was then removed with an adequate stent curl noted in the renal pelvis as well as in the bladder.  The bladder was then emptied and the procedure ended.  The patient appeared to tolerate the procedure well and without complications.  The patient was able to be awakened and transferred to the recovery unit in satisfactory condition.    Ardis Hughs, M.D. [facility]

## 2022-10-02 NOTE — Assessment & Plan Note (Signed)
-  A1c was 5.0 in 09/2021 -Will recheck, as glucose today is markedly elevated

## 2022-10-02 NOTE — Progress Notes (Signed)
Progress Note   Patient: Jill Patel [id number] DOB: [birth year] DOA: 10/01/2022     1 DOS: the patient was seen and examined on 10/02/2022   Brief hospital course: Patient with h/o depression who presented on 3/12 with obstructing L ureterolithiasis with sepsis from L-sided pyelonephritis.  Urology planned to take to the OR for cystoscopy/ureteroscopy and L ureteral stent placement.    Assessment and Plan: * Pyelonephritis of left kidney -Patient with progressive L flank pain, nausea, fever -Imaging in the ER showed L pyelonephritis with perinephric stranding and an obstructing stone -Sepsis ruled out, as she did not have evidence of end-organ damage -Urine culture pending -Blood cultures were not collected -Patient is being treated with Rocephin  Kidney stone on left side -Patient was found to have obstructing L ureterolithiasis with a 105m calculus at the UV junction -Urology took the patient to the OR overnight and performed cystoscopy/ureteroscopy and placed a L ureteral stent  Breast feeding status of mother -Attempt to provide medications that are safe in lactation -Breast pump is at the bedside  Intermediate hyperglycemia without complication -[id number] 5.0 in 09/2021 -Will recheck, as glucose today is markedly elevated  Prolonged QT interval -Likely associated with dehydration, anticipate resolution  -Awaiting repeat EKG - if normal QTc can dc telemetry -Will attempt to avoid QT-prolonging medications such as PPI, nausea meds, SSRIs for now      Subjective: Patient reports feeling ok today.  She has some vaginal soreness and is overall fatigued.  She has a 671moaughter and is breat feeding, pump at bedside  Physical Exam: Vitals:   10/02/22 0200 10/02/22 0212 10/02/22 0542 10/02/22 1254  BP: 109/65 101/64 103/60 123/73  Pulse: 71 79 63 62  Resp: '19  15 18  '$ Temp:  99.3 F (37.4 C) 98.4 F (36.9 C) 98.2 F (36.8 C)  TempSrc:  Oral Oral Oral  SpO2: 96% 98%  99% 100%  Weight:  54 kg    Height:  '5\' 3"'$  (1.6 m)     General:  Appears calm and comfortable and is in NAD, boyfriend is lying in bed with her Eyes:  EOMI, normal lids, iris ENT:  grossly normal hearing, lips & tongue, mmm; appropriate dentition Neck:  no LAD, masses or thyromegaly Cardiovascular:  RRR, no m/r/g. No LE edema.  Respiratory:   CTA bilaterally with no wheezes/rales/rhonchi.  Normal respiratory effort. Abdomen:  soft, NT, ND Back:   normal alignment, no CVAT Skin:  no rash or induration seen on limited exam Musculoskeletal:  grossly normal tone BUE/BLE, good ROM, no bony abnormality Psychiatric:  grossly normal mood and affect, speech fluent and appropriate, AOx3 Neurologic:  CN 2-12 grossly intact, moves all extremities in coordinated fashion   Radiological Exams on Admission: Independently reviewed - see discussion in A/P where applicable  DG C-Arm [id number]-[id number] Report  Result Date: 10/02/2022 Fluoroscopy was utilized by the requesting physician.  No radiographic interpretation.   CT Renal Stone Study  Result Date: 10/01/2022 CLINICAL DATA:  Worsening pain with history of recent stone EXAM: CT ABDOMEN AND PELVIS WITHOUT CONTRAST TECHNIQUE: Multidetector CT imaging of the abdomen and pelvis was performed following the standard protocol without IV contrast. RADIATION DOSE REDUCTION: This exam was performed according to the departmental dose-optimization program which includes automated exposure control, adjustment of the mA and/or kV according to patient size and/or use of iterative reconstruction technique. COMPARISON:  09/27/2022 FINDINGS: Lower chest: No acute abnormality. Hepatobiliary: Unremarkable liver, gallbladder, and biliary tree. Pancreas: Unremarkable. Spleen:  Unremarkable. Adrenals/Urinary Tract: Normal adrenal glands. Bilateral nonobstructing calyceal stones measuring up to 10 mm in the left kidney. Unchanged mild left hydroureteronephrosis upstream from a 5 mm  calculus at the left UVJ. Slight asymmetric increase in perinephric stranding about the left kidney. No obstructing right ureteral calculi or right hydronephrosis. Unremarkable bladder. Stomach/Bowel: Normal caliber large and small bowel. No bowel wall thickening. Unremarkable stomach. Vascular/Lymphatic: No significant vascular findings are present. No enlarged abdominal or pelvic lymph nodes. Reproductive: Uterus and bilateral adnexa are unremarkable. Other: Small volume free fluid in the pelvis. No free intraperitoneal air. Musculoskeletal: No acute osseous abnormality. IMPRESSION: 1. Left hydroureteronephrosis upstream from a 5 mm calculus at the left UVJ. Slight asymmetric increase in perinephric stranding about the left kidney may be due to obstruction or pyelonephritis. 2. Bilateral nonobstructing calyceal stones measuring up to 10 mm in the left kidney. 3. Small amount of free fluid in the pelvis. Electronically Signed   By: Placido Sou M.D.   On: 10/01/2022 19:44    EKG: none today   Labs on Admission: I have personally reviewed the available labs and imaging studies at the time of the admission.  Pertinent labs:    Na++ 134 Glucose 217 Lactate 0.8 WBC 15.1 Hgb 11.7 UA: moderate Hgb, 15 ketones, moderate LE, few bacteria Urine culture pending  Family Communication: Boyfriend was present throughout evaluation  Disposition: Status is: Inpatient Remains inpatient appropriate because: IV antibiotics  Planned Discharge Destination: Home    Time spent: 50 minutes  Author: Karmen Bongo, MD 10/02/2022 3:41 PM  For on call review [email or web address].

## 2022-10-02 NOTE — Hospital Course (Signed)
Patient with h/o depression who presented on 3/12 with obstructing L ureterolithiasis with sepsis from L-sided pyelonephritis.  Urology planned to take to the OR for cystoscopy/ureteroscopy and L ureteral stent placement.

## 2022-10-02 NOTE — Progress Notes (Cosign Needed)
  Transition of Care [facility]) Screening Note   Patient Details  Name: Jill Patel Date of Birth: 02/01/1996   Transition of Care [facility]) CM/SW Contact:    Dessa Phi, RN Phone Number: 10/02/2022, 1:16 PM    Transition of Care Department [facility]) has reviewed patient and no TOC needs have been identified at this time. We will continue to monitor patient advancement through interdisciplinary progression rounds. If new patient transition needs arise, please place a TOC consult.

## 2022-10-02 NOTE — Anesthesia Postprocedure Evaluation (Signed)
Anesthesia Post Note  Patient: Jill Patel  Procedure(s) Performed: CYSTOSCOPY WITH RETROGRADE PYELOGRAM/URETERAL STENT PLACEMENT (Left: Ureter)     Patient location during evaluation: PACU Anesthesia Type: General Level of consciousness: awake Pain management: pain level controlled Vital Signs Assessment: post-procedure vital signs reviewed and stable Respiratory status: spontaneous breathing, nonlabored ventilation and respiratory function stable Cardiovascular status: blood pressure returned to baseline and stable Postop Assessment: no apparent nausea or vomiting Anesthetic complications: no   No notable events documented.  Last Vitals:  Vitals:   10/02/22 1254 10/02/22 1852  BP: 123/73 134/86  Pulse: 62 (!) 59  Resp: 18 18  Temp: 36.8 C 36.7 C  SpO2: 100% 100%    Last Pain:  Vitals:   10/02/22 1852  TempSrc: Oral  PainSc:                  [name] [name]

## 2022-10-02 NOTE — Assessment & Plan Note (Signed)
-  Patient with progressive L flank pain, nausea, fever -Imaging in the ER showed L pyelonephritis with perinephric stranding and an obstructing stone -Sepsis ruled out, as she did not have evidence of end-organ damage -Urine culture pending -Blood cultures were not collected -Patient is being treated with Rocephin

## 2022-10-02 NOTE — Anesthesia Procedure Notes (Signed)
Procedure Name: LMA Insertion Date/Time: 10/02/2022 12:07 AM  Performed by: Deliah Boston, CRNAPre-anesthesia Checklist: Patient identified, Emergency Drugs available, Suction available and Patient being monitored Patient Re-evaluated:Patient Re-evaluated prior to induction Oxygen Delivery Method: Circle system utilized Preoxygenation: Pre-oxygenation with 100% oxygen Induction Type: IV induction Ventilation: Mask ventilation without difficulty LMA: LMA inserted LMA Size: 4.0 Number of attempts: 1 Placement Confirmation: positive ETCO2 and breath sounds checked- equal and bilateral Tube secured with: Tape Dental Injury: Teeth and Oropharynx as per pre-operative assessment

## 2022-10-02 NOTE — Transfer of Care (Signed)
Immediate Anesthesia Transfer of Care Note  Patient: Clairessa Budzinski  Procedure(s) Performed: Procedure(s): CYSTOSCOPY WITH RETROGRADE PYELOGRAM/URETERAL STENT PLACEMENT (Left)  Patient Location: PACU  Anesthesia Type:General  Level of Consciousness: Patient easily awoken, sedated, comfortable, cooperative, following commands, responds to stimulation.   Airway & Oxygen Therapy: Patient spontaneously breathing, ventilating well, oxygen via simple oxygen mask.  Post-op Assessment: Report given to PACU RN, vital signs reviewed and stable, moving all extremities.   Post vital signs: Reviewed and stable.  Complications: No apparent anesthesia complications  Last Vitals:  Vitals Value Taken Time  BP 114/71 10/02/22 0038  Temp    Pulse 90 10/02/22 0042  Resp 19 10/02/22 0042  SpO2 100 % 10/02/22 0042  Vitals shown include unvalidated device data.  Last Pain:  Vitals:   10/01/22 2215  TempSrc: Oral  PainSc:          Complications: No notable events documented.

## 2022-10-02 NOTE — Progress Notes (Signed)
Urology Inpatient Progress Report  Hypokalemia [E87.6] Kidney stone [N20.0] Pyelonephritis [N12] Kidney stone on left side [N20.0]  Procedure(s): CYSTOSCOPY WITH RETROGRADE PYELOGRAM/URETERAL STENT PLACEMENT  1 Day Post-Op  Intv/Subj: No acute events overnight. Patient is without complaint. Afebrile  Principal Problem:   Kidney stone on left side Active Problems:   Pyelonephritis of left kidney   Left flank pain   Hypokalemia   Prolonged QT interval   Sepsis ([place])  Current Facility-Administered Medications  Medication Dose Route Frequency Provider Last Rate Last Admin   acetaminophen (TYLENOL) tablet 650 mg  650 mg Oral Q6H PRN Ardis Hughs, MD       Or   acetaminophen (TYLENOL) suppository 650 mg  650 mg Rectal Q6H PRN Ardis Hughs, MD       cefTRIAXone (ROCEPHIN) 1 g in sodium chloride 0.9 % 100 mL IVPB  1 g Intravenous Q24H Ardis Hughs, MD       fentaNYL (SUBLIMAZE) 50 MCG/ML injection            fentaNYL (SUBLIMAZE) injection 25 mcg  25 mcg Intravenous Q2H PRN Ardis Hughs, MD       ferrous sulfate tablet 325 mg  325 mg Oral Q breakfast Howerter, Justin B, DO   325 mg at 10/02/22 1051   HYDROcodone-acetaminophen (NORCO/VICODIN) 5-325 MG per tablet 1 tablet  1 tablet Oral Q6H PRN Ardis Hughs, MD       ibuprofen (ADVIL) tablet 600 mg  600 mg Oral Q6H Ardis Hughs, MD   600 mg at 10/02/22 1051   lactated ringers infusion   Intravenous Continuous Ardis Hughs, MD 125 mL/hr at 10/02/22 1050 New Bag at 10/02/22 1050   LORazepam (ATIVAN) injection 0.5 mg  0.5 mg Intravenous Q6H PRN Howerter, Justin B, DO       melatonin tablet 3 mg  3 mg Oral QHS PRN Ardis Hughs, MD       naloxone [facility]) injection 0.4 mg  0.4 mg Intravenous PRN Ardis Hughs, MD       oxyCODONE (Oxy IR/ROXICODONE) immediate release tablet 5 mg  5 mg Oral Q4H PRN Ardis Hughs, MD       senna-docusate (Senokot-S) tablet 2 tablet  2 tablet  Oral Daily Ardis Hughs, MD   2 tablet at 10/02/22 1050     Objective: Vital: Vitals:   10/02/22 0153 10/02/22 0200 10/02/22 0212 10/02/22 0542  BP: 106/66 109/65 101/64 103/60  Pulse: 71 71 79 63  Resp: '11 19  15  '$ Temp: 99.2 F (37.3 C)  99.3 F (37.4 C) 98.4 F (36.9 C)  TempSrc:   Oral Oral  SpO2: 98% 96% 98% 99%  Weight:   54 kg   Height:   '5\' 3"'$  (1.6 m)    I/Os: I/O last 3 completed shifts: In: 2235.2 [P.O.:120; I.V.:1600; IV Piggyback:515.2] Out: 450 [Urine:450]  Physical Exam:  General: Patient is in no apparent distress Lungs: Normal respiratory effort, chest expands symmetrically. GI:  The abdomen is soft and nontender without mass. Ext: lower extremities symmetric  Lab Results: Recent Labs    09/30/22 0946 10/01/22 1736 10/01/22 2334 10/02/22 0453  WBC 9.1 12.7*  --  15.1*  HGB 11.5* 11.2* 10.9* 11.7*  HCT 34.8* 32.3* 32.0* 34.7*   Recent Labs    09/30/22 0946 10/01/22 1736 10/01/22 2334 10/02/22 0453  NA 140 131* 137 134*  K 3.3* 2.5* 3.7 3.7  CL 107 99 102 103  CO2  21* 22  --  22  GLUCOSE 81 96 97 217*  BUN 13 9 5* 9  CREATININE 0.93 0.90 0.70 0.74  CALCIUM 9.1 8.2*  --  8.5*   No results for input(s): "LABPT", "INR" in the last 72 hours. No results for input(s): "LABURIN" in the last 72 hours. Results for orders placed or performed during the hospital encounter of 07/17/22  Urine Culture     Status: Abnormal   Collection Time: 07/17/22  1:09 PM   Specimen: Urine, Clean Catch  Result Value Ref Range Status   Specimen Description   Final    URINE, CLEAN CATCH Performed at [facility], [place] [address] [place],  [address]    Special Requests   Final    NONE Performed at [facility], [place] [address], [place], [place] [address]    Culture 80,000 COLONIES/mL STAPHYLOCOCCUS SAPROPHYTICUS (A)  Final   Report Status 07/19/2022 FINAL  Final   Organism ID, Bacteria STAPHYLOCOCCUS  SAPROPHYTICUS (A)  Final      Susceptibility   Staphylococcus saprophyticus - MIC*    CIPROFLOXACIN <=0.5 SENSITIVE Sensitive     GENTAMICIN <=0.5 SENSITIVE Sensitive     NITROFURANTOIN <=16 SENSITIVE Sensitive     OXACILLIN SENSITIVE Sensitive     TETRACYCLINE <=1 SENSITIVE Sensitive     VANCOMYCIN <=0.5 SENSITIVE Sensitive     TRIMETH/SULFA <=10 SENSITIVE Sensitive     CLINDAMYCIN >=8 RESISTANT Resistant     RIFAMPIN <=0.5 SENSITIVE Sensitive     Inducible Clindamycin NEGATIVE Sensitive     * 80,000 COLONIES/mL STAPHYLOCOCCUS SAPROPHYTICUS    Studies/Results: DG C-Arm 1-60 Min-No Report  Result Date: 10/02/2022 Fluoroscopy was utilized by the requesting physician.  No radiographic interpretation.   CT Renal Stone Study  Result Date: 10/01/2022 CLINICAL DATA:  Worsening pain with history of recent stone EXAM: CT ABDOMEN AND PELVIS WITHOUT CONTRAST TECHNIQUE: Multidetector CT imaging of the abdomen and pelvis was performed following the standard protocol without IV contrast. RADIATION DOSE REDUCTION: This exam was performed according to the departmental dose-optimization program which includes automated exposure control, adjustment of the mA and/or kV according to patient size and/or use of iterative reconstruction technique. COMPARISON:  09/27/2022 FINDINGS: Lower chest: No acute abnormality. Hepatobiliary: Unremarkable liver, gallbladder, and biliary tree. Pancreas: Unremarkable. Spleen: Unremarkable. Adrenals/Urinary Tract: Normal adrenal glands. Bilateral nonobstructing calyceal stones measuring up to 10 mm in the left kidney. Unchanged mild left hydroureteronephrosis upstream from a 5 mm calculus at the left UVJ. Slight asymmetric increase in perinephric stranding about the left kidney. No obstructing right ureteral calculi or right hydronephrosis. Unremarkable bladder. Stomach/Bowel: Normal caliber large and small bowel. No bowel wall thickening. Unremarkable stomach. Vascular/Lymphatic:  No significant vascular findings are present. No enlarged abdominal or pelvic lymph nodes. Reproductive: Uterus and bilateral adnexa are unremarkable. Other: Small volume free fluid in the pelvis. No free intraperitoneal air. Musculoskeletal: No acute osseous abnormality. IMPRESSION: 1. Left hydroureteronephrosis upstream from a 5 mm calculus at the left UVJ. Slight asymmetric increase in perinephric stranding about the left kidney may be due to obstruction or pyelonephritis. 2. Bilateral nonobstructing calyceal stones measuring up to 10 mm in the left kidney. 3. Small amount of free fluid in the pelvis. Electronically Signed   By: Placido Sou M.D.   On: 10/01/2022 19:44    Assessment: Procedure(s): CYSTOSCOPY WITH RETROGRADE PYELOGRAM/URETERAL STENT PLACEMENT, 1 Day Post-Op  doing well.  Plan: The patient is afebrile today and feeling better.  I  do not see that any urine cultures were obtained at the [place] urgent care.  Urine from the OR is likely to be unhelpful, because she had not gotten antibiotics 5 or 6 hours prior.  As such, would put her on an antibiotic that is broad-spectrum and safe when breast-feeding and see how she does for 24 hours.  If she is afebrile tomorrow she can be discharged.  I will work to get her set up for ureteroscopy in the coming weeks.  Will follow her on an as-needed basis.   Louis Meckel, MD Urology 10/02/2022, 11:15 AM

## 2022-10-02 NOTE — Assessment & Plan Note (Signed)
-  Attempt to provide medications that are safe in lactation -Breast pump is at the bedside

## 2022-10-03 DIAGNOSIS — N12 Tubulo-interstitial nephritis, not specified as acute or chronic: Secondary | ICD-10-CM | POA: Diagnosis not present

## 2022-10-03 LAB — BASIC METABOLIC PANEL
Anion gap: 11 (ref 5–15)
BUN: 8 mg/dL (ref 6–20)
CO2: 24 mmol/L (ref 22–32)
Calcium: 8.9 mg/dL (ref 8.9–10.3)
Chloride: 106 mmol/L (ref 98–111)
Creatinine, Ser: 0.68 mg/dL (ref 0.44–1.00)
GFR, Estimated: >60 mL/min (ref >60)
Glucose, Bld: 86 mg/dL (ref 70–99)
Potassium: 3.1 mmol/L — ABNORMAL LOW (ref 3.5–5.1)
Sodium: 141 mmol/L (ref 135–145)

## 2022-10-03 LAB — URINE CULTURE: Culture: NO GROWTH

## 2022-10-03 LAB — CBC WITH DIFFERENTIAL/PLATELET
Abs Immature Granulocytes: 0.05 10*3/uL (ref 0.00–0.07)
Basophils Absolute: 0 10*3/uL (ref 0.0–0.1)
Basophils Relative: 0 %
Eosinophils Absolute: 0 10*3/uL (ref 0.0–0.5)
Eosinophils Relative: 0 %
HCT: 31.6 % — ABNORMAL LOW (ref 36.0–46.0)
Hemoglobin: 10.4 g/dL — ABNORMAL LOW (ref 12.0–15.0)
Immature Granulocytes: 0 %
Lymphocytes Relative: 28 %
Lymphs Abs: 3.6 10*3/uL (ref 0.7–4.0)
MCH: 29.7 pg (ref 26.0–34.0)
MCHC: 32.9 g/dL (ref 30.0–36.0)
MCV: 90.3 fL (ref 80.0–100.0)
Monocytes Absolute: 1 10*3/uL (ref 0.1–1.0)
Monocytes Relative: 8 %
Neutro Abs: 8.5 10*3/uL — ABNORMAL HIGH (ref 1.7–7.7)
Neutrophils Relative %: 64 %
Platelets: 241 10*3/uL (ref 150–400)
RBC: 3.5 MIL/uL — ABNORMAL LOW (ref 3.87–5.11)
RDW: 11.9 % (ref 11.5–15.5)
WBC: 13.3 10*3/uL — ABNORMAL HIGH (ref 4.0–10.5)
nRBC: 0 % (ref 0.0–0.2)

## 2022-10-03 LAB — HEMOGLOBIN A1C
Hgb A1c MFr Bld: 5 % (ref 4.8–5.6)
Mean Plasma Glucose: 97 mg/dL

## 2022-10-03 MED ORDER — POTASSIUM CHLORIDE CRYS ER 20 MEQ PO TBCR
40.0000 meq | EXTENDED_RELEASE_TABLET | Freq: Once | ORAL | Status: AC
  Administered 2022-10-03: 40 meq via ORAL
  Filled 2022-10-03: qty 2

## 2022-10-03 MED ORDER — CEPHALEXIN 500 MG PO CAPS: 500.0000 mg | ORAL_CAPSULE | Freq: Three times a day (TID) | ORAL | 0 refills | Status: AC

## 2022-10-03 NOTE — Discharge Summary (Signed)
Physician Discharge Summary   Patient: Jill Patel MRN: [id number] DOB: January 26, 1996  Admit date:     10/01/2022  Discharge date: 10/03/22  Discharge Physician: Karmen Bongo   PCP: Pcp, No   Recommendations at discharge:   Take antibiotics as directed until gone (2 weeks) Follow up with your PCP in 1-2 weeks Follow up with urology for stent removal as directed Monitor the baby for diarrhea and/or thrush while you are taking antibiotics  Discharge Diagnoses: Principal Problem:   Pyelonephritis of left kidney Active Problems:   Kidney stone on left side   Prolonged QT interval   Intermediate hyperglycemia without complication   Breast feeding status of mother    Hospital Course: Patient with h/o depression who presented on 3/12 with obstructing L ureterolithiasis with sepsis from L-sided pyelonephritis.  Urology planned to take to the OR for cystoscopy/ureteroscopy and L ureteral stent placement.    Assessment and Plan: * Pyelonephritis of left kidney -Patient with progressive L flank pain, nausea, fever -Imaging in the ER showed L pyelonephritis with perinephric stranding and an obstructing stone -Sepsis ruled out, as she did not have evidence of end-organ damage -Urine culture pending -Blood cultures were not collected -Patient is being treated with Rocephin  Kidney stone on left side -Patient was found to have obstructing L ureterolithiasis with a 51m calculus at the UV junction -Urology took the patient to the OR overnight and performed cystoscopy/ureteroscopy and placed a L ureteral stent  Breast feeding status of mother -Attempt to provide medications that are safe in lactation -Breast pump is at the bedside  Intermediate hyperglycemia without complication -[id number] 5.0 in 09/2021 -Will recheck, as glucose today is markedly elevated  Prolonged QT interval -Likely associated with dehydration, anticipate resolution  -Awaiting repeat EKG - if normal QTc can dc  telemetry -Will attempt to avoid QT-prolonging medications such as PPI, nausea meds, SSRIs for now        Consultants: Urology Procedures performed: Cystoscopy/ureteroscopy with stent placement  Disposition: Home Diet recommendation:  Regular diet DISCHARGE MEDICATION: Allergies as of 10/03/2022       Reactions   Kiwi Extract Anaphylaxis   Aleve [naproxen Sodium] Other (See Comments)   Stomach bleeds. Not allergic per Pt    Mango Flavor Other (See Comments)   Scratchy throat   Other Other (See Comments)   Eggplant - scratchy throat    Pineapple Other (See Comments)   Scratchy throat         Medication List     STOP taking these medications    HYDROcodone-acetaminophen 5-325 MG tablet Commonly known as: NORCO/VICODIN   Iron (Ferrous Sulfate) 325 (65 Fe) MG Tabs   ondansetron 4 MG tablet Commonly known as: ZOFRAN   oxyCODONE 5 MG immediate release tablet Commonly known as: Roxicodone   tamsulosin 0.4 MG Caps capsule Commonly known as: FLOMAX       TAKE these medications    acetaminophen 500 MG tablet Commonly known as: TYLENOL Take 1,000 mg by mouth as needed for fever or moderate pain. What changed: Another medication with the same name was removed. Continue taking this medication, and follow the directions you see here.   cephALEXin 500 MG capsule Commonly known as: KEFLEX Take 1 capsule (500 mg total) by mouth 3 (three) times daily for 14 days.   ibuprofen 200 MG tablet Commonly known as: ADVIL Take 600 mg by mouth as needed for moderate pain. What changed: Another medication with the same name was removed. Continue taking  this medication, and follow the directions you see here.        Discharge Exam:  Subjective:  She reports increased pain overnight, some hematuria.  She saved the urine, which is pink and faintly blood-tinged.  Filed Weights   10/01/22 1733 10/02/22 0212  Weight: 52.3 kg 54 kg   General:  Appears calm and comfortable  and is in NAD, eager to go home but anxious Eyes:  EOMI, normal lids, iris ENT:  grossly normal hearing, lips & tongue, mmm; appropriate dentition Neck:  no LAD, masses or thyromegaly Cardiovascular:  RRR, no m/r/g. No LE edema.  Respiratory:   CTA bilaterally with no wheezes/rales/rhonchi.  Normal respiratory effort. Abdomen:  soft, NT, ND Back:   normal alignment, no CVAT Skin:  no rash or induration seen on limited exam Musculoskeletal:  grossly normal tone BUE/BLE, good ROM, no bony abnormality Psychiatric:  grossly normal mood and affect, speech fluent and appropriate, AOx3 Neurologic:  CN 2-12 grossly intact, moves all extremities in coordinated fashion   EKG: Independently reviewed.  QTc 415 on repeat EKG   Labs on Admission: I have personally reviewed the available labs and imaging studies at the time of the admission.  Pertinent labs:    K+ 3.1 - repleted WBC 13.3, down from 15.1 Hgb 10.4 Urine culture negative - but obtained in OR and after abx  Condition at discharge: good  The results of significant diagnostics from this hospitalization (including imaging, microbiology, ancillary and laboratory) are listed below for reference.   Imaging Studies: DG C-Arm 1-60 Min-No Report  Result Date: 10/02/2022 Fluoroscopy was utilized by the requesting physician.  No radiographic interpretation.   CT Renal Stone Study  Result Date: 10/01/2022 CLINICAL DATA:  Worsening pain with history of recent stone EXAM: CT ABDOMEN AND PELVIS WITHOUT CONTRAST TECHNIQUE: Multidetector CT imaging of the abdomen and pelvis was performed following the standard protocol without IV contrast. RADIATION DOSE REDUCTION: This exam was performed according to the departmental dose-optimization program which includes automated exposure control, adjustment of the mA and/or kV according to patient size and/or use of iterative reconstruction technique. COMPARISON:  09/27/2022 FINDINGS: Lower chest: No acute  abnormality. Hepatobiliary: Unremarkable liver, gallbladder, and biliary tree. Pancreas: Unremarkable. Spleen: Unremarkable. Adrenals/Urinary Tract: Normal adrenal glands. Bilateral nonobstructing calyceal stones measuring up to 10 mm in the left kidney. Unchanged mild left hydroureteronephrosis upstream from a 5 mm calculus at the left UVJ. Slight asymmetric increase in perinephric stranding about the left kidney. No obstructing right ureteral calculi or right hydronephrosis. Unremarkable bladder. Stomach/Bowel: Normal caliber large and small bowel. No bowel wall thickening. Unremarkable stomach. Vascular/Lymphatic: No significant vascular findings are present. No enlarged abdominal or pelvic lymph nodes. Reproductive: Uterus and bilateral adnexa are unremarkable. Other: Small volume free fluid in the pelvis. No free intraperitoneal air. Musculoskeletal: No acute osseous abnormality. IMPRESSION: 1. Left hydroureteronephrosis upstream from a 5 mm calculus at the left UVJ. Slight asymmetric increase in perinephric stranding about the left kidney may be due to obstruction or pyelonephritis. 2. Bilateral nonobstructing calyceal stones measuring up to 10 mm in the left kidney. 3. Small amount of free fluid in the pelvis. Electronically Signed   By: Placido Sou M.D.   On: 10/01/2022 19:44   CT Abdomen Pelvis W Contrast  Result Date: 09/27/2022 CLINICAL DATA:  Left lower quadrant abdominal pain EXAM: CT ABDOMEN AND PELVIS WITH CONTRAST TECHNIQUE: Multidetector CT imaging of the abdomen and pelvis was performed using the standard protocol following bolus administration of  intravenous contrast. RADIATION DOSE REDUCTION: This exam was performed according to the departmental dose-optimization program which includes automated exposure control, adjustment of the mA and/or kV according to patient size and/or use of iterative reconstruction technique. CONTRAST:  74m OMNIPAQUE IOHEXOL 350 MG/ML SOLN COMPARISON:  CT abdomen  and pelvis 07/17/2022 FINDINGS: Lower chest: No acute abnormality. Hepatobiliary: No focal liver abnormality is seen. No gallstones, gallbladder wall thickening, or biliary dilatation. Pancreas: Unremarkable. No pancreatic ductal dilatation or surrounding inflammatory changes. Spleen: Normal in size without focal abnormality. Adrenals/Urinary Tract: There are 3 left renal calculi measuring up to 8 mm, unchanged. There is a 4 mm calculus in the distal left ureter just proximal to the ureterovesicular junction similar to prior. There is mild left-sided hydronephrosis. Adrenal glands, right kidney and bladder are within normal limits. Stomach/Bowel: Stomach is within normal limits. Appendix is not seen. No evidence of bowel wall thickening, distention, or inflammatory changes. Vascular/Lymphatic: No significant vascular findings are present. No enlarged abdominal or pelvic lymph nodes. Reproductive: Uterus and bilateral adnexa are unremarkable. Other: There is trace free fluid in the pelvis. There is no abdominal wall hernia. Musculoskeletal: No acute or significant osseous findings. IMPRESSION: 1. 4 mm calculus in the distal left ureter with mild obstructive uropathy. 2. Nonobstructing left renal calculi. 3. Trace free fluid in the pelvis. Electronically Signed   By: ARonney AstersM.D.   On: 09/27/2022 22:16    Microbiology: Results for orders placed or performed during the hospital encounter of 10/01/22  Urine Culture     Status: None   Collection Time: 10/02/22 12:40 AM   Specimen: Urine, Cystoscope  Result Value Ref Range Status   Specimen Description   Final    URINE, RANDOM Performed at [facility] [address] [place] [address]   Special Requests   Final    CYSTOSCOPY Performed at [facility] [address], [place] [id number]   Culture   Final    NO GROWTH Performed at [facility] [address], [place]  [id number]   Report Status 10/03/2022 FINAL  Final   Discharge time spent: greater than 30 minutes.  Signed: JKarmen Bongo MD [facility] 10/03/2022

## 2022-10-03 NOTE — Progress Notes (Signed)
Patient to be discharged to home today. Patient given discharge instructions including all discharge Medications and schedules for these Medications. Understanding verbalized and discharge AVS with the Patient at time of discharge

## 2022-10-07 ENCOUNTER — Other Ambulatory Visit: Payer: Self-pay | Admitting: Urology

## 2022-10-21 ENCOUNTER — Other Ambulatory Visit (HOSPITAL_COMMUNITY)
Admission: RE | Admit: 2022-10-21 | Discharge: 2022-10-21 | Disposition: A | Discharge status: Home / Self Care | Payer: Medicaid Other | Source: Ambulatory Visit | Attending: Obstetrics | Admitting: Obstetrics

## 2022-10-21 ENCOUNTER — Ambulatory Visit (INDEPENDENT_AMBULATORY_CARE_PROVIDER_SITE_OTHER): Payer: Medicaid Other

## 2022-10-21 VITALS — BP 121/73 | HR 77 | Wt 114.4 lb

## 2022-10-21 DIAGNOSIS — N898 Other specified noninflammatory disorders of vagina: Secondary | ICD-10-CM | POA: Diagnosis not present

## 2022-10-21 DIAGNOSIS — Z113 Encounter for screening for infections with a predominantly sexual mode of transmission: Secondary | ICD-10-CM | POA: Diagnosis not present

## 2022-10-21 DIAGNOSIS — N912 Amenorrhea, unspecified: Secondary | ICD-10-CM | POA: Diagnosis not present

## 2022-10-21 LAB — POCT URINE PREGNANCY: Preg Test, Ur: NEGATIVE

## 2022-10-21 NOTE — Progress Notes (Signed)
Jill Patel presents today for UPT. She took two home UPTs one was negative and one was positive. Patient also requests all STD testing.   LMP: n/a pt is breastfeeding 75 month old    OBJECTIVE: Appears well, in no apparent distress.  OB History     Gravida  4   Para  1   Term  1   Preterm      AB  3   Living  1      SAB  1   IAB  2   Ectopic      Multiple  0   Live Births  1        Obstetric Comments  Elective w/meds x2        Home UPT Result: negative/positive In-Office UPT result: negative I have reviewed the patient's medical, obstetrical, social, and family histories, and medications.   ASSESSMENT: Negative pregnancy test  PLAN: STD self swab and blood test completed today

## 2022-10-22 LAB — CERVICOVAGINAL ANCILLARY ONLY
Comment: NEGATIVE
Comment: NEGATIVE
Comment: NEGATIVE
Comment: NEGATIVE
Comment: NEGATIVE
Comment: NORMAL

## 2022-10-22 LAB — BETA HCG QUANT (REF LAB): hCG Quant: <1 m[IU]/mL

## 2022-10-22 LAB — HEPATITIS C ANTIBODY: Hep C Virus Ab: NONREACTIVE

## 2022-10-22 LAB — RPR: RPR Ser Ql: NONREACTIVE

## 2022-10-22 LAB — HEPATITIS B SURFACE ANTIGEN: Hepatitis B Surface Ag: NEGATIVE

## 2022-10-22 LAB — HIV ANTIBODY (ROUTINE TESTING W REFLEX): HIV Screen 4th Generation wRfx: NONREACTIVE

## 2022-10-29 ENCOUNTER — Encounter (HOSPITAL_BASED_OUTPATIENT_CLINIC_OR_DEPARTMENT_OTHER): Payer: Self-pay | Admitting: Urology

## 2022-10-29 NOTE — Progress Notes (Addendum)
Spoke w/ via phone for pre-op interview--- [facility] Lab needs dos----UPT per anesthesia               Lab results------Current EKG in Epic dated 10/02/22. COVID test -----patient states asymptomatic no test needed Arrive at -------0630 NPO after MN NO Solid Food.   Med rec completed Medications to take morning of surgery -----NONE Diabetic medication ----- Patient instructed no nail polish to be worn day of surgery Patient instructed to bring photo id and insurance card day of surgery Patient aware to have Driver (ride ) / caregiver  Phillip Heal  for 24 hours after surgery  Patient Special Instructions ----- Pre-Op special Istructions ----- Patient verbalized understanding of instructions that were given at this phone interview. Patient denies shortness of breath, chest pain, fever, cough at this phone interview.

## 2022-10-30 ENCOUNTER — Other Ambulatory Visit: Payer: Self-pay

## 2022-10-30 ENCOUNTER — Inpatient Hospital Stay (HOSPITAL_COMMUNITY)
Admission: EM | Admit: 2022-10-30 | Discharge: 2022-11-02 | DRG: 854 | Disposition: A | Discharge status: Home / Self Care | Payer: Medicaid Other | Attending: Internal Medicine | Admitting: Internal Medicine

## 2022-10-30 ENCOUNTER — Encounter (HOSPITAL_COMMUNITY): Payer: Self-pay

## 2022-10-30 DIAGNOSIS — N12 Tubulo-interstitial nephritis, not specified as acute or chronic: Secondary | ICD-10-CM | POA: Diagnosis present

## 2022-10-30 DIAGNOSIS — F419 Anxiety disorder, unspecified: Secondary | ICD-10-CM | POA: Diagnosis present

## 2022-10-30 DIAGNOSIS — A419 Sepsis, unspecified organism: Principal | ICD-10-CM | POA: Diagnosis present

## 2022-10-30 DIAGNOSIS — Z833 Family history of diabetes mellitus: Secondary | ICD-10-CM

## 2022-10-30 DIAGNOSIS — F32A Depression, unspecified: Secondary | ICD-10-CM | POA: Diagnosis present

## 2022-10-30 DIAGNOSIS — Z91018 Allergy to other foods: Secondary | ICD-10-CM

## 2022-10-30 DIAGNOSIS — R000 Tachycardia, unspecified: Secondary | ICD-10-CM | POA: Diagnosis not present

## 2022-10-30 DIAGNOSIS — R0789 Other chest pain: Secondary | ICD-10-CM | POA: Diagnosis present

## 2022-10-30 DIAGNOSIS — E876 Hypokalemia: Secondary | ICD-10-CM | POA: Diagnosis present

## 2022-10-30 DIAGNOSIS — M419 Scoliosis, unspecified: Secondary | ICD-10-CM | POA: Diagnosis present

## 2022-10-30 DIAGNOSIS — R918 Other nonspecific abnormal finding of lung field: Secondary | ICD-10-CM | POA: Diagnosis present

## 2022-10-30 DIAGNOSIS — D563 Thalassemia minor: Secondary | ICD-10-CM | POA: Diagnosis present

## 2022-10-30 DIAGNOSIS — Z8249 Family history of ischemic heart disease and other diseases of the circulatory system: Secondary | ICD-10-CM

## 2022-10-30 DIAGNOSIS — J45909 Unspecified asthma, uncomplicated: Secondary | ICD-10-CM | POA: Diagnosis present

## 2022-10-30 DIAGNOSIS — R9431 Abnormal electrocardiogram [ECG] [EKG]: Secondary | ICD-10-CM | POA: Diagnosis present

## 2022-10-30 DIAGNOSIS — Z87891 Personal history of nicotine dependence: Secondary | ICD-10-CM

## 2022-10-30 DIAGNOSIS — R110 Nausea: Secondary | ICD-10-CM | POA: Diagnosis present

## 2022-10-30 LAB — CBC WITH DIFFERENTIAL/PLATELET
Abs Immature Granulocytes: 0.08 10*3/uL — ABNORMAL HIGH (ref 0.00–0.07)
Basophils Absolute: 0.1 10*3/uL (ref 0.0–0.1)
Basophils Relative: 0 %
Eosinophils Absolute: 0 10*3/uL (ref 0.0–0.5)
Eosinophils Relative: 0 %
HCT: 35.9 % — ABNORMAL LOW (ref 36.0–46.0)
Hemoglobin: 11.8 g/dL — ABNORMAL LOW (ref 12.0–15.0)
Immature Granulocytes: 1 %
Lymphocytes Relative: 8 %
Lymphs Abs: 1.3 10*3/uL (ref 0.7–4.0)
MCH: 29.4 pg (ref 26.0–34.0)
MCHC: 32.9 g/dL (ref 30.0–36.0)
MCV: 89.3 fL (ref 80.0–100.0)
Monocytes Absolute: 0.9 10*3/uL (ref 0.1–1.0)
Monocytes Relative: 6 %
Neutro Abs: 14.3 10*3/uL — ABNORMAL HIGH (ref 1.7–7.7)
Neutrophils Relative %: 85 %
Platelets: 237 10*3/uL (ref 150–400)
RBC: 4.02 MIL/uL (ref 3.87–5.11)
RDW: 12.4 % (ref 11.5–15.5)
WBC: 16.6 10*3/uL — ABNORMAL HIGH (ref 4.0–10.5)
nRBC: 0 % (ref 0.0–0.2)

## 2022-10-30 LAB — URINALYSIS, ROUTINE W REFLEX MICROSCOPIC
Bilirubin Urine: NEGATIVE
Glucose, UA: NEGATIVE mg/dL
Ketones, ur: NEGATIVE mg/dL
Nitrite: POSITIVE — AB
Protein, ur: 100 mg/dL — AB
Specific Gravity, Urine: 1.013 (ref 1.005–1.030)
WBC, UA: >50 WBC/hpf (ref 0–5)
pH: 6 (ref 5.0–8.0)

## 2022-10-30 NOTE — ED Triage Notes (Signed)
Left flank pain started today around 1700. Pt has stent in left kidney and multiple known stones. Pt had recent kidney infection with hospitalization. Nausea, vomiting. Denies diarrhea.  C/o dysuria since 1700 today.

## 2022-10-30 NOTE — ED Notes (Signed)
Sent urine with culture  

## 2022-10-30 NOTE — ED Provider Triage Note (Signed)
Emergency Medicine Provider Triage Evaluation Note  Domonic Balke , a 27 y.o. female  was evaluated in triage.  Pt complains of left flank/back pain that started around 5 AM this morning.  Also endorses nausea and vomiting.  Recent admission for pyelonephritis and infected kidney stone with left stent placement.  Patient admits to fever prior to arrival is 101.5 F.  Review of Systems  Positive: Flank pain Negative: CP  Physical Exam  LMP 10/22/2022 Comment: Neg preg test 10/01/22 Gen:   Awake, no distress   Resp:  Normal effort  MSK:   Moves extremities without difficulty  Other:    Medical Decision Making  Medically screening exam initiated at 10:53 PM.  Appropriate orders placed.  Mixtli Graul was informed that the remainder of the evaluation will be completed by another provider, this initial triage assessment does not replace that evaluation, and the importance of remaining in the ED until their evaluation is complete.  Labs CT abdomen   Mannie Stabile, PA-C 10/30/22 2310

## 2022-10-31 ENCOUNTER — Encounter (HOSPITAL_COMMUNITY)
Admission: EM | Disposition: A | Discharge status: Home / Self Care | Payer: Self-pay | Source: Home / Self Care | Attending: Internal Medicine

## 2022-10-31 ENCOUNTER — Inpatient Hospital Stay (HOSPITAL_COMMUNITY): Payer: Medicaid Other | Admitting: Certified Registered"

## 2022-10-31 ENCOUNTER — Other Ambulatory Visit: Payer: Self-pay

## 2022-10-31 ENCOUNTER — Encounter (HOSPITAL_COMMUNITY): Payer: Self-pay

## 2022-10-31 ENCOUNTER — Emergency Department (HOSPITAL_COMMUNITY): Payer: Medicaid Other

## 2022-10-31 ENCOUNTER — Inpatient Hospital Stay (HOSPITAL_COMMUNITY): Payer: Medicaid Other

## 2022-10-31 DIAGNOSIS — A419 Sepsis, unspecified organism: Secondary | ICD-10-CM | POA: Diagnosis not present

## 2022-10-31 DIAGNOSIS — B3749 Other urogenital candidiasis: Secondary | ICD-10-CM | POA: Diagnosis not present

## 2022-10-31 DIAGNOSIS — N12 Tubulo-interstitial nephritis, not specified as acute or chronic: Secondary | ICD-10-CM | POA: Diagnosis not present

## 2022-10-31 DIAGNOSIS — D563 Thalassemia minor: Secondary | ICD-10-CM | POA: Diagnosis not present

## 2022-10-31 DIAGNOSIS — M419 Scoliosis, unspecified: Secondary | ICD-10-CM | POA: Diagnosis not present

## 2022-10-31 DIAGNOSIS — R000 Tachycardia, unspecified: Secondary | ICD-10-CM | POA: Diagnosis not present

## 2022-10-31 DIAGNOSIS — R918 Other nonspecific abnormal finding of lung field: Secondary | ICD-10-CM | POA: Diagnosis not present

## 2022-10-31 DIAGNOSIS — R9431 Abnormal electrocardiogram [ECG] [EKG]: Secondary | ICD-10-CM | POA: Diagnosis not present

## 2022-10-31 DIAGNOSIS — N390 Urinary tract infection, site not specified: Secondary | ICD-10-CM | POA: Diagnosis not present

## 2022-10-31 DIAGNOSIS — N201 Calculus of ureter: Secondary | ICD-10-CM | POA: Diagnosis not present

## 2022-10-31 DIAGNOSIS — R0789 Other chest pain: Secondary | ICD-10-CM | POA: Diagnosis not present

## 2022-10-31 DIAGNOSIS — B49 Unspecified mycosis: Secondary | ICD-10-CM | POA: Diagnosis not present

## 2022-10-31 DIAGNOSIS — Z466 Encounter for fitting and adjustment of urinary device: Secondary | ICD-10-CM

## 2022-10-31 DIAGNOSIS — Z833 Family history of diabetes mellitus: Secondary | ICD-10-CM | POA: Diagnosis not present

## 2022-10-31 DIAGNOSIS — N158 Other specified renal tubulo-interstitial diseases: Secondary | ICD-10-CM

## 2022-10-31 DIAGNOSIS — Z91018 Allergy to other foods: Secondary | ICD-10-CM | POA: Diagnosis not present

## 2022-10-31 DIAGNOSIS — J45909 Unspecified asthma, uncomplicated: Secondary | ICD-10-CM | POA: Diagnosis not present

## 2022-10-31 DIAGNOSIS — Z87891 Personal history of nicotine dependence: Secondary | ICD-10-CM | POA: Diagnosis not present

## 2022-10-31 DIAGNOSIS — E876 Hypokalemia: Secondary | ICD-10-CM | POA: Diagnosis not present

## 2022-10-31 DIAGNOSIS — F419 Anxiety disorder, unspecified: Secondary | ICD-10-CM | POA: Diagnosis not present

## 2022-10-31 DIAGNOSIS — R110 Nausea: Secondary | ICD-10-CM | POA: Diagnosis not present

## 2022-10-31 DIAGNOSIS — F32A Depression, unspecified: Secondary | ICD-10-CM | POA: Diagnosis not present

## 2022-10-31 DIAGNOSIS — Z8249 Family history of ischemic heart disease and other diseases of the circulatory system: Secondary | ICD-10-CM | POA: Diagnosis not present

## 2022-10-31 HISTORY — PX: CYSTOSCOPY WITH STENT PLACEMENT: SHX5790

## 2022-10-31 LAB — CBC WITH DIFFERENTIAL/PLATELET
Abs Immature Granulocytes: 0.05 10*3/uL (ref 0.00–0.07)
Basophils Absolute: 0 10*3/uL (ref 0.0–0.1)
Basophils Relative: 0 %
Eosinophils Absolute: 0 10*3/uL (ref 0.0–0.5)
Eosinophils Relative: 0 %
HCT: 28.3 % — ABNORMAL LOW (ref 36.0–46.0)
Hemoglobin: 9.4 g/dL — ABNORMAL LOW (ref 12.0–15.0)
Immature Granulocytes: 0 %
Lymphocytes Relative: 7 %
Lymphs Abs: 0.9 10*3/uL (ref 0.7–4.0)
MCH: 30.3 pg (ref 26.0–34.0)
MCHC: 33.2 g/dL (ref 30.0–36.0)
MCV: 91.3 fL (ref 80.0–100.0)
Monocytes Absolute: 0.3 10*3/uL (ref 0.1–1.0)
Monocytes Relative: 2 %
Neutro Abs: 11.3 10*3/uL — ABNORMAL HIGH (ref 1.7–7.7)
Neutrophils Relative %: 91 %
Platelets: 172 10*3/uL (ref 150–400)
RBC: 3.1 MIL/uL — ABNORMAL LOW (ref 3.87–5.11)
RDW: 12.4 % (ref 11.5–15.5)
WBC: 12.5 10*3/uL — ABNORMAL HIGH (ref 4.0–10.5)
nRBC: 0 % (ref 0.0–0.2)

## 2022-10-31 LAB — BASIC METABOLIC PANEL
Anion gap: 6 (ref 5–15)
Anion gap: 6 (ref 5–15)
BUN: 10 mg/dL (ref 6–20)
BUN: 7 mg/dL (ref 6–20)
CO2: 21 mmol/L — ABNORMAL LOW (ref 22–32)
CO2: 22 mmol/L (ref 22–32)
Calcium: 7.2 mg/dL — ABNORMAL LOW (ref 8.9–10.3)
Calcium: 7.6 mg/dL — ABNORMAL LOW (ref 8.9–10.3)
Chloride: 106 mmol/L (ref 98–111)
Chloride: 110 mmol/L (ref 98–111)
Creatinine, Ser: 0.68 mg/dL (ref 0.44–1.00)
Creatinine, Ser: 0.66 mg/dL (ref 0.44–1.00)
GFR, Estimated: >60 mL/min (ref >60)
GFR, Estimated: >60 mL/min (ref >60)
Glucose, Bld: 141 mg/dL — ABNORMAL HIGH (ref 70–99)
Glucose, Bld: 106 mg/dL — ABNORMAL HIGH (ref 70–99)
Potassium: 3 mmol/L — ABNORMAL LOW (ref 3.5–5.1)
Potassium: 4.1 mmol/L (ref 3.5–5.1)
Sodium: 133 mmol/L — ABNORMAL LOW (ref 135–145)
Sodium: 138 mmol/L (ref 135–145)

## 2022-10-31 LAB — COMPREHENSIVE METABOLIC PANEL
ALT: 16 U/L (ref 0–44)
AST: 21 U/L (ref 15–41)
Albumin: 4.4 g/dL (ref 3.5–5.0)
Alkaline Phosphatase: 85 U/L (ref 38–126)
Anion gap: 7 (ref 5–15)
BUN: 14 mg/dL (ref 6–20)
CO2: 24 mmol/L (ref 22–32)
Calcium: 8.8 mg/dL — ABNORMAL LOW (ref 8.9–10.3)
Chloride: 105 mmol/L (ref 98–111)
Creatinine, Ser: 0.77 mg/dL (ref 0.44–1.00)
GFR, Estimated: >60 mL/min (ref >60)
Glucose, Bld: 124 mg/dL — ABNORMAL HIGH (ref 70–99)
Potassium: 3.3 mmol/L — ABNORMAL LOW (ref 3.5–5.1)
Sodium: 136 mmol/L (ref 135–145)
Total Bilirubin: 0.7 mg/dL (ref 0.3–1.2)
Total Protein: 7.8 g/dL (ref 6.5–8.1)

## 2022-10-31 LAB — LIPASE, BLOOD: Lipase: 28 U/L (ref 11–51)

## 2022-10-31 LAB — MAGNESIUM
Magnesium: 1.6 mg/dL — ABNORMAL LOW (ref 1.7–2.4)
Magnesium: 3.1 mg/dL — ABNORMAL HIGH (ref 1.7–2.4)

## 2022-10-31 LAB — PHOSPHORUS: Phosphorus: 2.6 mg/dL (ref 2.5–4.6)

## 2022-10-31 LAB — PREGNANCY, URINE: Preg Test, Ur: NEGATIVE

## 2022-10-31 LAB — LACTIC ACID, PLASMA: Lactic Acid, Venous: 1.1 mmol/L (ref 0.5–1.9)

## 2022-10-31 SURGERY — CYSTOSCOPY, FLEXIBLE, WITH STENT REPLACEMENT
Anesthesia: General | Laterality: Left

## 2022-10-31 SURGERY — CYSTOSCOPY, WITH STENT INSERTION
Anesthesia: General | Laterality: Left

## 2022-10-31 MED ORDER — MIDAZOLAM HCL 5 MG/5ML IJ SOLN
INTRAMUSCULAR | Status: DC | PRN
  Administered 2022-10-31: 2 mg via INTRAVENOUS

## 2022-10-31 MED ORDER — SENNOSIDES-DOCUSATE SODIUM 8.6-50 MG PO TABS
1.0000 | ORAL_TABLET | Freq: Every day | ORAL | Status: DC
  Administered 2022-10-31 – 2022-11-01 (×2): 1 via ORAL
  Filled 2022-10-31 (×2): qty 1

## 2022-10-31 MED ORDER — LIDOCAINE HCL (PF) 2 % IJ SOLN
INTRAMUSCULAR | Status: AC
  Filled 2022-10-31: qty 5

## 2022-10-31 MED ORDER — IOHEXOL 300 MG/ML  SOLN
INTRAMUSCULAR | Status: DC | PRN
  Administered 2022-10-31: 5 mL

## 2022-10-31 MED ORDER — LACTATED RINGERS IV SOLN: INTRAVENOUS | Status: DC

## 2022-10-31 MED ORDER — LIDOCAINE 2% (20 MG/ML) 5 ML SYRINGE
INTRAMUSCULAR | Status: DC | PRN
  Administered 2022-10-31: 100 mg via INTRAVENOUS

## 2022-10-31 MED ORDER — MAGNESIUM SULFATE 4 GM/100ML IV SOLN
4.0000 g | Freq: Once | INTRAVENOUS | Status: AC
  Administered 2022-10-31: 4 g via INTRAVENOUS
  Filled 2022-10-31: qty 100

## 2022-10-31 MED ORDER — IOHEXOL 300 MG/ML  SOLN
100.0000 mL | Freq: Once | INTRAMUSCULAR | Status: AC | PRN
  Administered 2022-10-31: 100 mL via INTRAVENOUS

## 2022-10-31 MED ORDER — OXYCODONE HCL 5 MG PO TABS
5.0000 mg | ORAL_TABLET | Freq: Four times a day (QID) | ORAL | Status: DC | PRN
  Administered 2022-10-31: 5 mg via ORAL
  Filled 2022-10-31 (×2): qty 1

## 2022-10-31 MED ORDER — FENTANYL CITRATE PF 50 MCG/ML IJ SOSY: 25.0000 ug | PREFILLED_SYRINGE | INTRAMUSCULAR | Status: DC | PRN

## 2022-10-31 MED ORDER — FENTANYL CITRATE (PF) 100 MCG/2ML IJ SOLN
INTRAMUSCULAR | Status: DC | PRN
  Administered 2022-10-31: 50 ug via INTRAVENOUS

## 2022-10-31 MED ORDER — SODIUM CHLORIDE 0.9 % IV SOLN
100.0000 mg | INTRAVENOUS | Status: AC
  Administered 2022-10-31: 100 mg via INTRAVENOUS
  Filled 2022-10-31: qty 5

## 2022-10-31 MED ORDER — ACETAMINOPHEN 325 MG PO TABS: 650.0000 mg | ORAL_TABLET | Freq: Four times a day (QID) | ORAL | Status: DC | PRN

## 2022-10-31 MED ORDER — SODIUM CHLORIDE 0.9 % IV BOLUS
1000.0000 mL | Freq: Once | INTRAVENOUS | Status: AC
  Administered 2022-10-31: 1000 mL via INTRAVENOUS

## 2022-10-31 MED ORDER — PHENYLEPHRINE 80 MCG/ML (10ML) SYRINGE FOR IV PUSH (FOR BLOOD PRESSURE SUPPORT)
PREFILLED_SYRINGE | INTRAVENOUS | Status: DC | PRN
  Administered 2022-10-31 (×3): 160 ug via INTRAVENOUS

## 2022-10-31 MED ORDER — HYDROMORPHONE HCL 1 MG/ML IJ SOLN
0.5000 mg | INTRAMUSCULAR | Status: DC | PRN
  Administered 2022-10-31 – 2022-11-01 (×3): 0.5 mg via INTRAVENOUS
  Filled 2022-10-31 (×3): qty 0.5

## 2022-10-31 MED ORDER — HYDROMORPHONE HCL 1 MG/ML IJ SOLN
1.0000 mg | Freq: Once | INTRAMUSCULAR | Status: AC
  Administered 2022-10-31: 1 mg via INTRAVENOUS
  Filled 2022-10-31: qty 1

## 2022-10-31 MED ORDER — ONDANSETRON HCL 4 MG/2ML IJ SOLN
4.0000 mg | Freq: Once | INTRAMUSCULAR | Status: AC
  Administered 2022-10-31: 4 mg via INTRAVENOUS
  Filled 2022-10-31: qty 2

## 2022-10-31 MED ORDER — DEXAMETHASONE SODIUM PHOSPHATE 10 MG/ML IJ SOLN
INTRAMUSCULAR | Status: AC
  Filled 2022-10-31: qty 1

## 2022-10-31 MED ORDER — ONDANSETRON HCL 4 MG/2ML IJ SOLN
INTRAMUSCULAR | Status: AC
  Filled 2022-10-31: qty 2

## 2022-10-31 MED ORDER — POLYETHYLENE GLYCOL 3350 17 G PO PACK: 17.0000 g | PACK | Freq: Every day | ORAL | Status: DC | PRN

## 2022-10-31 MED ORDER — ACETAMINOPHEN 500 MG PO TABS
1000.0000 mg | ORAL_TABLET | Freq: Once | ORAL | Status: AC
  Administered 2022-10-31: 1000 mg via ORAL
  Filled 2022-10-31: qty 2

## 2022-10-31 MED ORDER — ONDANSETRON HCL 4 MG/2ML IJ SOLN
4.0000 mg | Freq: Four times a day (QID) | INTRAMUSCULAR | Status: DC | PRN
  Administered 2022-11-01: 4 mg via INTRAVENOUS
  Filled 2022-10-31: qty 2

## 2022-10-31 MED ORDER — POTASSIUM CHLORIDE 2 MEQ/ML IV SOLN
INTRAVENOUS | Status: DC
  Filled 2022-10-31 (×3): qty 1000

## 2022-10-31 MED ORDER — SODIUM CHLORIDE (PF) 0.9 % IJ SOLN
INTRAMUSCULAR | Status: AC
  Filled 2022-10-31: qty 50

## 2022-10-31 MED ORDER — DEXMEDETOMIDINE HCL IN NACL 80 MCG/20ML IV SOLN
INTRAVENOUS | Status: DC | PRN
  Administered 2022-10-31: 4 ug via BUCCAL

## 2022-10-31 MED ORDER — POTASSIUM CHLORIDE 10 MEQ/100ML IV SOLN
10.0000 meq | INTRAVENOUS | Status: AC
  Administered 2022-10-31 (×3): 10 meq via INTRAVENOUS
  Filled 2022-10-31 (×4): qty 100

## 2022-10-31 MED ORDER — SODIUM CHLORIDE 0.9 % IV SOLN: INTRAVENOUS | Status: DC

## 2022-10-31 MED ORDER — MIDAZOLAM HCL 2 MG/2ML IJ SOLN
INTRAMUSCULAR | Status: AC
  Filled 2022-10-31: qty 2

## 2022-10-31 MED ORDER — SODIUM CHLORIDE 0.9 % IV SOLN: 100.0000 mg | INTRAVENOUS | Status: DC

## 2022-10-31 MED ORDER — SODIUM CHLORIDE 0.9 % IV SOLN
2.0000 g | INTRAVENOUS | Status: DC
  Administered 2022-11-01 – 2022-11-02 (×2): 2 g via INTRAVENOUS
  Filled 2022-10-31 (×2): qty 20

## 2022-10-31 MED ORDER — DEXAMETHASONE SODIUM PHOSPHATE 10 MG/ML IJ SOLN
INTRAMUSCULAR | Status: DC | PRN
  Administered 2022-10-31: 10 mg via INTRAVENOUS

## 2022-10-31 MED ORDER — ONDANSETRON HCL 4 MG/2ML IJ SOLN
INTRAMUSCULAR | Status: DC | PRN
  Administered 2022-10-31: 4 mg via INTRAVENOUS

## 2022-10-31 MED ORDER — FLUCONAZOLE IN SODIUM CHLORIDE 200-0.9 MG/100ML-% IV SOLN
200.0000 mg | INTRAVENOUS | Status: DC
  Administered 2022-11-01 – 2022-11-02 (×2): 200 mg via INTRAVENOUS
  Filled 2022-10-31 (×2): qty 100

## 2022-10-31 MED ORDER — PROPOFOL 10 MG/ML IV BOLUS
INTRAVENOUS | Status: DC | PRN
  Administered 2022-10-31: 180 mg via INTRAVENOUS

## 2022-10-31 MED ORDER — SODIUM CHLORIDE 0.9 % IR SOLN
Status: DC | PRN
  Administered 2022-10-31: 3000 mL via INTRAVESICAL

## 2022-10-31 MED ORDER — SODIUM CHLORIDE 0.9 % IV SOLN
2.0000 g | Freq: Once | INTRAVENOUS | Status: AC
  Administered 2022-10-31: 2 g via INTRAVENOUS
  Filled 2022-10-31: qty 20

## 2022-10-31 MED ORDER — POTASSIUM CHLORIDE 10 MEQ/100ML IV SOLN
10.0000 meq | Freq: Once | INTRAVENOUS | Status: AC
  Administered 2022-10-31: 10 meq via INTRAVENOUS
  Filled 2022-10-31: qty 100

## 2022-10-31 MED ORDER — FENTANYL CITRATE (PF) 100 MCG/2ML IJ SOLN
INTRAMUSCULAR | Status: AC
  Filled 2022-10-31: qty 2

## 2022-10-31 SURGICAL SUPPLY — 12 items
BAG URO CATCHER STRL LF (MISCELLANEOUS) ×2 IMPLANT
CATH URETL OPEN 5X70 (CATHETERS) ×2 IMPLANT
CLOTH BEACON ORANGE TIMEOUT ST (SAFETY) ×2 IMPLANT
GLOVE SURG LX STRL 7.5 STRW (GLOVE) ×2 IMPLANT
GOWN STRL REUS W/ TWL XL LVL3 (GOWN DISPOSABLE) ×2 IMPLANT
GOWN STRL REUS W/TWL XL LVL3 (GOWN DISPOSABLE) ×1
GUIDEWIRE STR DUAL SENSOR (WIRE) ×2 IMPLANT
KIT TURNOVER KIT A (KITS) IMPLANT
MANIFOLD NEPTUNE II (INSTRUMENTS) ×2 IMPLANT
PACK CYSTO (CUSTOM PROCEDURE TRAY) ×2 IMPLANT
STENT URET 6FRX24 CONTOUR (STENTS) IMPLANT
TUBING CONNECTING 10 (TUBING) IMPLANT

## 2022-10-31 NOTE — Consult Note (Addendum)
Urology Consult   Physician requesting consult: Dr. Butler Denmarkizwan, MD  Reason for consult: Pyelonephritis  History of Present Illness: Jill Patel is a 27 y.o. female with a history of anxiety and depression, and nephrolithiasis.  She was recently found to have an infected obstructing left 5 mm UVJ stone and is now status post left ureteral stent placement with Dr. Marlou PorchHerrick on 10/01/2022.  She is scheduled for left ureteroscopic stone extraction next week on 11/07/2022 with Dr. Marlou PorchHerrick.  She reports that she developed nausea, left-sided flank pain and fever to 101 yesterday.  This prompted her visit to the emergency department overnight.  In the emergency department, she was initially afebrile but developed a fever to 102 approximately 1 hour ago.  She has been tachycardic to the 110s with normal blood pressures.  She has a leukocytosis to 16.6, hemoglobin 11.8, creatinine 1.77 which is her baseline.  Lactate was 1.1.  Urinalysis concerning for infection with large leukocyte esterase, positive nitrite, rare bacteria with pyuria and microscopic hematuria.  She does have budding yeast on her urinalysis.  Urine culture and blood cultures were sent.  Subsequent CT imaging notable for left ureteral stent in appropriate positioning with some patchy hypoenhancement of bilateral kidneys concerning for pyelonephritis.  The UVJ stone which is 5 mm is still at the UVJ.  The patient denies any dysuria or hematuria at this time.   Past Medical History:  Diagnosis Date   Anemia    Anxiety    Asthma    Depression    never on meds   Headache    Heart murmur    at birth   History of kidney stones    Kidney cysts    Ovarian cyst    Scoliosis     Past Surgical History:  Procedure Laterality Date   CYSTOSCOPY W/ URETERAL STENT PLACEMENT Left 10/01/2022   Procedure: CYSTOSCOPY WITH RETROGRADE PYELOGRAM/URETERAL STENT PLACEMENT;  Surgeon: Crist FatHerrick, Benjamin W, MD;  Location: [facility];  Service: Urology;   Laterality: Left;   TONSILLECTOMY      Current Hospital Medications:  Home Meds:  No outpatient medications have been marked as taking for the 10/30/22 encounter [facility](Hospital Encounter).    Scheduled Meds:  senna-docusate  1 tablet Oral QHS   Continuous Infusions:  [START ON 11/01/2022] cefTRIAXone (ROCEPHIN)  IV     lactated ringers 1,000 mL with potassium chloride 20 mEq infusion     PRN Meds:.acetaminophen, HYDROmorphone (DILAUDID) injection, oxyCODONE, polyethylene glycol  Allergies:  Allergies  Allergen Reactions   Kiwi Extract Anaphylaxis   Aleve [Naproxen Sodium] Other (See Comments)    Stomach bleeds. Not allergic per Pt    Mango Flavor Other (See Comments)    Scratchy throat   Other Other (See Comments)    Eggplant - scratchy throat    Pineapple Other (See Comments)    Scratchy throat     Family History  Problem Relation Age of Onset   Healthy Mother    Healthy Father    Diabetes Maternal Grandfather    Heart disease Maternal Grandfather    Cancer Maternal Grandfather     Social History:  reports that she has quit smoking. Her smoking use included cigars. She has never been exposed to tobacco smoke. She has never used smokeless tobacco. She reports current alcohol use. She reports that she does not use drugs.  ROS: A complete review of systems was performed.  All systems are negative except for pertinent findings as noted.  Physical Exam:  Vital signs in last 24 hours: Temp:  [98.2 F (36.8 C)-102 F (38.9 C)] 102 F (38.9 C) (04/11 0534) Pulse Rate:  [110-112] 110 (04/11 0255) Resp:  [16-18] 16 (04/11 0255) BP: (109-111)/(65-68) 111/65 (04/11 0255) SpO2:  [100 %] 100 % (04/11 0255) Weight:  [50.8 kg] 50.8 kg (04/10 2300) Constitutional:  Alert and oriented, No acute distress Cardiovascular: Regular rate Respiratory: Normal respiratory effort on room air GI: Abdomen is soft, nontender, nondistended GU: Left-sided CVA tenderness, voiding  spontaneously Psychiatric: Normal mood and affect  Laboratory Data:  Recent Labs    10/30/22 2300  WBC 16.6*  HGB 11.8*  HCT 35.9*  PLT 237    Recent Labs    10/30/22 2300  NA 136  K 3.3*  CL 105  GLUCOSE 124*  BUN 14  CALCIUM 8.8*  CREATININE 0.77     Results for orders placed or performed during the hospital encounter of 10/30/22 (from the past 24 hour(s))  CBC with Differential     Status: Abnormal   Collection Time: 10/30/22 11:00 PM  Result Value Ref Range   WBC 16.6 (H) 4.0 - 10.5 K/uL   RBC 4.02 3.87 - 5.11 MIL/uL   Hemoglobin 11.8 (L) 12.0 - 15.0 g/dL   HCT 14.735.9 (L) 82.936.0 - 56.246.0 %   MCV 89.3 80.0 - 100.0 fL   MCH 29.4 26.0 - 34.0 pg   MCHC 32.9 30.0 - 36.0 g/dL   RDW 13.012.4 86.511.5 - 78.415.5 %   Platelets 237 150 - 400 K/uL   nRBC 0.0 0.0 - 0.2 %   Neutrophils Relative % 85 %   Neutro Abs 14.3 (H) 1.7 - 7.7 K/uL   Lymphocytes Relative 8 %   Lymphs Abs 1.3 0.7 - 4.0 K/uL   Monocytes Relative 6 %   Monocytes Absolute 0.9 0.1 - 1.0 K/uL   Eosinophils Relative 0 %   Eosinophils Absolute 0.0 0.0 - 0.5 K/uL   Basophils Relative 0 %   Basophils Absolute 0.1 0.0 - 0.1 K/uL   Immature Granulocytes 1 %   Abs Immature Granulocytes 0.08 (H) 0.00 - 0.07 K/uL  Comprehensive metabolic panel     Status: Abnormal   Collection Time: 10/30/22 11:00 PM  Result Value Ref Range   Sodium 136 135 - 145 mmol/L   Potassium 3.3 (L) 3.5 - 5.1 mmol/L   Chloride 105 98 - 111 mmol/L   CO2 24 22 - 32 mmol/L   Glucose, Bld 124 (H) 70 - 99 mg/dL   BUN 14 6 - 20 mg/dL   Creatinine, Ser 6.960.77 0.44 - 1.00 mg/dL   Calcium 8.8 (L) 8.9 - 10.3 mg/dL   Total Protein 7.8 6.5 - 8.1 g/dL   Albumin 4.4 3.5 - 5.0 g/dL   AST 21 15 - 41 U/L   ALT 16 0 - 44 U/L   Alkaline Phosphatase 85 38 - 126 U/L   Total Bilirubin 0.7 0.3 - 1.2 mg/dL   GFR, Estimated >29>60 >52>60 mL/min   Anion gap 7 5 - 15  Lipase, blood     Status: None   Collection Time: 10/30/22 11:00 PM  Result Value Ref Range   Lipase 28 11  - 51 U/L  Lactic acid, plasma     Status: None   Collection Time: 10/30/22 11:00 PM  Result Value Ref Range   Lactic Acid, Venous 1.1 0.5 - 1.9 mmol/L  Urinalysis, Routine w reflex microscopic -Urine, Clean Catch     Status: Abnormal  Collection Time: 10/30/22 11:09 PM  Result Value Ref Range   Color, Urine YELLOW YELLOW   APPearance CLOUDY (A) CLEAR   Specific Gravity, Urine 1.013 1.005 - 1.030   pH 6.0 5.0 - 8.0   Glucose, UA NEGATIVE NEGATIVE mg/dL   Hgb urine dipstick MODERATE (A) NEGATIVE   Bilirubin Urine NEGATIVE NEGATIVE   Ketones, ur NEGATIVE NEGATIVE mg/dL   Protein, ur 034 (A) NEGATIVE mg/dL   Nitrite POSITIVE (A) NEGATIVE   Leukocytes,Ua LARGE (A) NEGATIVE   RBC / HPF 21-50 0 - 5 RBC/hpf   WBC, UA >50 0 - 5 WBC/hpf   Bacteria, UA RARE (A) NONE SEEN   Squamous Epithelial / HPF 0-5 0 - 5 /HPF   WBC Clumps PRESENT    Mucus PRESENT    Budding Yeast PRESENT    No results found for this or any previous visit (from the past 240 hour(s)).  Renal Function: Recent Labs    10/30/22 2300  CREATININE 0.77   Estimated Creatinine Clearance: 87 mL/min (by C-G formula based on SCr of 0.77 mg/dL).  Radiologic Imaging: CT ABDOMEN PELVIS W CONTRAST  Result Date: 10/31/2022 CLINICAL DATA:  Left flank pain and dysuria. Recent kidney infection. Status post cystography with left ureteral stent placement. EXAM: CT ABDOMEN AND PELVIS WITH CONTRAST TECHNIQUE: Multidetector CT imaging of the abdomen and pelvis was performed using the standard protocol following bolus administration of intravenous contrast. RADIATION DOSE REDUCTION: This exam was performed according to the departmental dose-optimization program which includes automated exposure control, adjustment of the mA and/or kV according to patient size and/or use of iterative reconstruction technique. CONTRAST:  OMNIPAQUE IOHEXOL 300 MG/ML  SOLN COMPARISON:  10/09/2022. FINDINGS: Lower chest: No acute abnormality. Hepatobiliary:  Scattered subcentimeter hypodensities are present in the liver, statistically most likely representing cysts or hemangiomas. No biliary ductal dilatation. The gallbladder is without stones. Pancreas: Unremarkable. No pancreatic ductal dilatation or surrounding inflammatory changes. Spleen: Normal in size without focal abnormality. Adrenals/Urinary Tract: The adrenal glands are within normal limits. There is patchy hypoenhancement in the kidneys bilaterally. Renal calculi are present bilaterally. Left ureteral stent is in place with no evidence of obstructive uropathy. There is a 5 mm calculus in the distal left ureter adjacent to the ureteral stent. The bladder is unremarkable. Stomach/Bowel: Stomach is within normal limits. Appendix appears normal. No evidence of bowel wall thickening, distention, or inflammatory changes. No free air or pneumatosis. Vascular/Lymphatic: No significant vascular findings are present. Prominent lymph nodes are noted in the retroperitoneum at the level of the left kidney and are likely reactive. Reproductive: Uterus and bilateral adnexa are unremarkable. Other: A trace amount of free fluid is noted inferior to the cecum in the right lower quadrant. Musculoskeletal: No acute osseous abnormality. IMPRESSION: 1. Patchy hypoenhancement in the kidneys bilaterally, concerning for pyelonephritis. 2. Nonobstructive renal calculi bilaterally. Left ureteral stent in place with a 5 mm calculus in the distal left ureter. Electronically Signed   By: Thornell Sartorius M.D.   On: 10/31/2022 04:15    I independently reviewed the above imaging studies.  Impression/Recommendation Urosepsis Obstructing 70mm L UVJ stone s/p left ureteral stent on 10/01/2022 Concern for UTI and Candiduria  27 year old female with history of anxiety, depression, infected obstructing ureteral stone status post left ureteral stent on 10/01/2022 who presents to the emergency department with fever and left-sided flank  pain.  Patient febrile to 102 Fahrenheit, tachycardic to the 110s and normotensive.  Leukocytosis to 16.6, creatinine 0.7 and at  baseline. Urinalysis concerning for infection with large leukocyte esterase, positive nitrate, bacteria with budding yeast, pyuria and microscopic hematuria. Urine and blood cultures obtained and currently in process. She has received 2 g Rocephin and 2 L fluid bolus in the emergency department.  CT scan with known obstructing stone in place with left ureteral stent in appropriate positioning.  She does have patchy hypoenhancement of bilateral kidneys concerning for pyelonephritis.  Discussed with patient her evaluation and CT imaging from the emergency department.  Discussed that since she does have budding yeast on her urinalysis, this is an indication for exchange of her ureteral stent at this time. Discussed the risks and benefits of this procedure and she would like to proceed.   Recommend that she continue empiric antibiotics at this time.  Please add fluconazole for antifungal coverage.  She will need a total of at least 14 days of culture specific antibiotics due to complicated urinary tract infection.  She will ultimately follow-up with [facility] for her definitive stone treatment.  - Plan for cystourethroscopy, left ureteral stent exchange today. Please keep patient NPO - Follow up urine culture. Continue empiric antibiotics. Please add Antifungal coverage due to budding yeast on UA. Will need at least 14 days of culture specific antibiotics. - Follow up for definitive stone treatment outpatient   Neila Gear, MD [facility] 10/31/2022, 6:28 AM

## 2022-10-31 NOTE — Anesthesia Procedure Notes (Signed)
Procedure Name: LMA Insertion Date/Time: 10/31/2022 12:30 PM  Performed by: Kahlie Deutscher D, CRNAPre-anesthesia Checklist: Patient identified, Emergency Drugs available, Suction available and Patient being monitored Patient Re-evaluated:Patient Re-evaluated prior to induction Oxygen Delivery Method: Circle system utilized Preoxygenation: Pre-oxygenation with 100% oxygen Induction Type: IV induction Ventilation: Mask ventilation without difficulty LMA: LMA inserted LMA Size: 3.0 Tube type: Oral Number of attempts: 1 Placement Confirmation: positive ETCO2 and breath sounds checked- equal and bilateral Tube secured with: Tape Dental Injury: Teeth and Oropharynx as per pre-operative assessment

## 2022-10-31 NOTE — Progress Notes (Signed)
Pharmacy Antibiotic Note  Jill Patel is a 27 y.o. female admitted on 10/31/2022 with urosepsis, obstructing UVJ stone s/p L ureteral stent 10/01/2022.  U/A showed bacteria and budding yeast.  Given concern for candidal UTI, urology consulted pharmacy for fluconazole dosing.  Recommendation from urology is for at least 14 days of culture-specific antimicrobials. Also received empiric ceftriaxone 2 grams x1 in ED.    During recent hospitalization EKG on 10/01/22 demonstrated a prolonged QTc (546 msec).  Given potential for fluconazole to further prolong the QTc and increase risk of ventricular arrhythmias fluconazole was changed to micafungin 100 mg IV q24h.  Earlier today patient underwent cystoscopy and L ureteral stent exchange.  EKG from earlier today is now available and shows QTc has improved to 435 msec.   Plan: Change micafungin to fluconazole 200 mg IV q24h, next dose tomorrow AM. When taking POs well, change fluconazole to 200 mg PO daily to complete 14 days of antifungal therapy. Empiric antibiotic per urology, currently on ceftriaxone 2 grams IV q24h Follow culture results, clinical course.   Height: 5\' 3"  (160 cm) Weight: 55.5 kg (122 lb 5.7 oz) IBW/kg (Calculated) : 52.4  Temp (24hrs), Avg:98.9 F (37.2 C), Min:96.3 F (35.7 C), Max:102 F (38.9 C)  Recent Labs  Lab 10/30/22 2300 10/31/22 0633 10/31/22 1331  WBC 16.6* 12.5*  --   CREATININE 0.77 0.68 0.66  LATICACIDVEN 1.1  --   --      Estimated Creatinine Clearance: 89.7 mL/min (by C-G formula based on SCr of 0.66 mg/dL).    Allergies  Allergen Reactions   Kiwi Extract Anaphylaxis   Aleve [Naproxen Sodium] Other (See Comments)    Stomach bleeds. Not allergic per Pt    Mango Flavor Other (See Comments)    Scratchy throat   Other Other (See Comments)    Eggplant - scratchy throat    Pineapple Other (See Comments)    Scratchy throat     Antimicrobials this admission: 4/11 ceftriaxone>> 4/11  micafungin x1 dose 4/12 fluconazole >>  Dose adjustments this admission:  Microbiology results: 4/10 blood x 2: collected 4/10 urine x 1: collected  Thank you for allowing pharmacy to be a part of this patient's care.  Elie Goody, PharmD, BCPS Clinical Pharmacist 10/31/2022  2:37 PM

## 2022-10-31 NOTE — H&P (View-Only) (Signed)
Urology Consult   Physician requesting consult: Dr. Butler Denmarkizwan, MD  Reason for consult: Pyelonephritis  History of Present Illness: Jill Patel is a 27 y.o. female with a history of anxiety and depression, and nephrolithiasis.  She was recently found to have an infected obstructing left 5 mm UVJ stone and is now status post left ureteral stent placement with Dr. Marlou PorchHerrick on 10/01/2022.  She is scheduled for left ureteroscopic stone extraction next week on 11/07/2022 with Dr. Marlou PorchHerrick.  She reports that she developed nausea, left-sided flank pain and fever to 101 yesterday.  This prompted her visit to the emergency department overnight.  In the emergency department, she was initially afebrile but developed a fever to 102 approximately 1 hour ago.  She has been tachycardic to the 110s with normal blood pressures.  She has a leukocytosis to 16.6, hemoglobin 11.8, creatinine 1.77 which is her baseline.  Lactate was 1.1.  Urinalysis concerning for infection with large leukocyte esterase, positive nitrite, rare bacteria with pyuria and microscopic hematuria.  She does have budding yeast on her urinalysis.  Urine culture and blood cultures were sent.  Subsequent CT imaging notable for left ureteral stent in appropriate positioning with some patchy hypoenhancement of bilateral kidneys concerning for pyelonephritis.  The UVJ stone which is 5 mm is still at the UVJ.  The patient denies any dysuria or hematuria at this time.   Past Medical History:  Diagnosis Date   Anemia    Anxiety    Asthma    Depression    never on meds   Headache    Heart murmur    at birth   History of kidney stones    Kidney cysts    Ovarian cyst    Scoliosis     Past Surgical History:  Procedure Laterality Date   CYSTOSCOPY W/ URETERAL STENT PLACEMENT Left 10/01/2022   Procedure: CYSTOSCOPY WITH RETROGRADE PYELOGRAM/URETERAL STENT PLACEMENT;  Surgeon: Crist FatHerrick, Benjamin W, MD;  Location: [facility];  Service: Urology;   Laterality: Left;   TONSILLECTOMY      Current Hospital Medications:  Home Meds:  No outpatient medications have been marked as taking for the 10/30/22 encounter [facility](Hospital Encounter).    Scheduled Meds:  senna-docusate  1 tablet Oral QHS   Continuous Infusions:  [START ON 11/01/2022] cefTRIAXone (ROCEPHIN)  IV     lactated ringers 1,000 mL with potassium chloride 20 mEq infusion     PRN Meds:.acetaminophen, HYDROmorphone (DILAUDID) injection, oxyCODONE, polyethylene glycol  Allergies:  Allergies  Allergen Reactions   Kiwi Extract Anaphylaxis   Aleve [Naproxen Sodium] Other (See Comments)    Stomach bleeds. Not allergic per Pt    Mango Flavor Other (See Comments)    Scratchy throat   Other Other (See Comments)    Eggplant - scratchy throat    Pineapple Other (See Comments)    Scratchy throat     Family History  Problem Relation Age of Onset   Healthy Mother    Healthy Father    Diabetes Maternal Grandfather    Heart disease Maternal Grandfather    Cancer Maternal Grandfather     Social History:  reports that she has quit smoking. Her smoking use included cigars. She has never been exposed to tobacco smoke. She has never used smokeless tobacco. She reports current alcohol use. She reports that she does not use drugs.  ROS: A complete review of systems was performed.  All systems are negative except for pertinent findings as noted.  Physical Exam:  Vital signs in last 24 hours: Temp:  [98.2 F (36.8 C)-102 F (38.9 C)] 102 F (38.9 C) (04/11 0534) Pulse Rate:  [110-112] 110 (04/11 0255) Resp:  [16-18] 16 (04/11 0255) BP: (109-111)/(65-68) 111/65 (04/11 0255) SpO2:  [100 %] 100 % (04/11 0255) Weight:  [50.8 kg] 50.8 kg (04/10 2300) Constitutional:  Alert and oriented, No acute distress Cardiovascular: Regular rate Respiratory: Normal respiratory effort on room air GI: Abdomen is soft, nontender, nondistended GU: Left-sided CVA tenderness, voiding  spontaneously Psychiatric: Normal mood and affect  Laboratory Data:  Recent Labs    10/30/22 2300  WBC 16.6*  HGB 11.8*  HCT 35.9*  PLT 237    Recent Labs    10/30/22 2300  NA 136  K 3.3*  CL 105  GLUCOSE 124*  BUN 14  CALCIUM 8.8*  CREATININE 0.77     Results for orders placed or performed during the hospital encounter of 10/30/22 (from the past 24 hour(s))  CBC with Differential     Status: Abnormal   Collection Time: 10/30/22 11:00 PM  Result Value Ref Range   WBC 16.6 (H) 4.0 - 10.5 K/uL   RBC 4.02 3.87 - 5.11 MIL/uL   Hemoglobin 11.8 (L) 12.0 - 15.0 g/dL   HCT 14.735.9 (L) 82.936.0 - 56.246.0 %   MCV 89.3 80.0 - 100.0 fL   MCH 29.4 26.0 - 34.0 pg   MCHC 32.9 30.0 - 36.0 g/dL   RDW 13.012.4 86.511.5 - 78.415.5 %   Platelets 237 150 - 400 K/uL   nRBC 0.0 0.0 - 0.2 %   Neutrophils Relative % 85 %   Neutro Abs 14.3 (H) 1.7 - 7.7 K/uL   Lymphocytes Relative 8 %   Lymphs Abs 1.3 0.7 - 4.0 K/uL   Monocytes Relative 6 %   Monocytes Absolute 0.9 0.1 - 1.0 K/uL   Eosinophils Relative 0 %   Eosinophils Absolute 0.0 0.0 - 0.5 K/uL   Basophils Relative 0 %   Basophils Absolute 0.1 0.0 - 0.1 K/uL   Immature Granulocytes 1 %   Abs Immature Granulocytes 0.08 (H) 0.00 - 0.07 K/uL  Comprehensive metabolic panel     Status: Abnormal   Collection Time: 10/30/22 11:00 PM  Result Value Ref Range   Sodium 136 135 - 145 mmol/L   Potassium 3.3 (L) 3.5 - 5.1 mmol/L   Chloride 105 98 - 111 mmol/L   CO2 24 22 - 32 mmol/L   Glucose, Bld 124 (H) 70 - 99 mg/dL   BUN 14 6 - 20 mg/dL   Creatinine, Ser 6.960.77 0.44 - 1.00 mg/dL   Calcium 8.8 (L) 8.9 - 10.3 mg/dL   Total Protein 7.8 6.5 - 8.1 g/dL   Albumin 4.4 3.5 - 5.0 g/dL   AST 21 15 - 41 U/L   ALT 16 0 - 44 U/L   Alkaline Phosphatase 85 38 - 126 U/L   Total Bilirubin 0.7 0.3 - 1.2 mg/dL   GFR, Estimated >29>60 >52>60 mL/min   Anion gap 7 5 - 15  Lipase, blood     Status: None   Collection Time: 10/30/22 11:00 PM  Result Value Ref Range   Lipase 28 11  - 51 U/L  Lactic acid, plasma     Status: None   Collection Time: 10/30/22 11:00 PM  Result Value Ref Range   Lactic Acid, Venous 1.1 0.5 - 1.9 mmol/L  Urinalysis, Routine w reflex microscopic -Urine, Clean Catch     Status: Abnormal  Collection Time: 10/30/22 11:09 PM  Result Value Ref Range   Color, Urine YELLOW YELLOW   APPearance CLOUDY (A) CLEAR   Specific Gravity, Urine 1.013 1.005 - 1.030   pH 6.0 5.0 - 8.0   Glucose, UA NEGATIVE NEGATIVE mg/dL   Hgb urine dipstick MODERATE (A) NEGATIVE   Bilirubin Urine NEGATIVE NEGATIVE   Ketones, ur NEGATIVE NEGATIVE mg/dL   Protein, ur 034 (A) NEGATIVE mg/dL   Nitrite POSITIVE (A) NEGATIVE   Leukocytes,Ua LARGE (A) NEGATIVE   RBC / HPF 21-50 0 - 5 RBC/hpf   WBC, UA >50 0 - 5 WBC/hpf   Bacteria, UA RARE (A) NONE SEEN   Squamous Epithelial / HPF 0-5 0 - 5 /HPF   WBC Clumps PRESENT    Mucus PRESENT    Budding Yeast PRESENT    No results found for this or any previous visit (from the past 240 hour(s)).  Renal Function: Recent Labs    10/30/22 2300  CREATININE 0.77   Estimated Creatinine Clearance: 87 mL/min (by C-G formula based on SCr of 0.77 mg/dL).  Radiologic Imaging: CT ABDOMEN PELVIS W CONTRAST  Result Date: 10/31/2022 CLINICAL DATA:  Left flank pain and dysuria. Recent kidney infection. Status post cystography with left ureteral stent placement. EXAM: CT ABDOMEN AND PELVIS WITH CONTRAST TECHNIQUE: Multidetector CT imaging of the abdomen and pelvis was performed using the standard protocol following bolus administration of intravenous contrast. RADIATION DOSE REDUCTION: This exam was performed according to the departmental dose-optimization program which includes automated exposure control, adjustment of the mA and/or kV according to patient size and/or use of iterative reconstruction technique. CONTRAST:  OMNIPAQUE IOHEXOL 300 MG/ML  SOLN COMPARISON:  10/09/2022. FINDINGS: Lower chest: No acute abnormality. Hepatobiliary:  Scattered subcentimeter hypodensities are present in the liver, statistically most likely representing cysts or hemangiomas. No biliary ductal dilatation. The gallbladder is without stones. Pancreas: Unremarkable. No pancreatic ductal dilatation or surrounding inflammatory changes. Spleen: Normal in size without focal abnormality. Adrenals/Urinary Tract: The adrenal glands are within normal limits. There is patchy hypoenhancement in the kidneys bilaterally. Renal calculi are present bilaterally. Left ureteral stent is in place with no evidence of obstructive uropathy. There is a 5 mm calculus in the distal left ureter adjacent to the ureteral stent. The bladder is unremarkable. Stomach/Bowel: Stomach is within normal limits. Appendix appears normal. No evidence of bowel wall thickening, distention, or inflammatory changes. No free air or pneumatosis. Vascular/Lymphatic: No significant vascular findings are present. Prominent lymph nodes are noted in the retroperitoneum at the level of the left kidney and are likely reactive. Reproductive: Uterus and bilateral adnexa are unremarkable. Other: A trace amount of free fluid is noted inferior to the cecum in the right lower quadrant. Musculoskeletal: No acute osseous abnormality. IMPRESSION: 1. Patchy hypoenhancement in the kidneys bilaterally, concerning for pyelonephritis. 2. Nonobstructive renal calculi bilaterally. Left ureteral stent in place with a 5 mm calculus in the distal left ureter. Electronically Signed   By: Thornell Sartorius M.D.   On: 10/31/2022 04:15    I independently reviewed the above imaging studies.  Impression/Recommendation Urosepsis Obstructing 70mm L UVJ stone s/p left ureteral stent on 10/01/2022 Concern for UTI and Candiduria  27 year old female with history of anxiety, depression, infected obstructing ureteral stone status post left ureteral stent on 10/01/2022 who presents to the emergency department with fever and left-sided flank  pain.  Patient febrile to 102 Fahrenheit, tachycardic to the 110s and normotensive.  Leukocytosis to 16.6, creatinine 0.7 and at  baseline. Urinalysis concerning for infection with large leukocyte esterase, positive nitrate, bacteria with budding yeast, pyuria and microscopic hematuria. Urine and blood cultures obtained and currently in process. She has received 2 g Rocephin and 2 L fluid bolus in the emergency department.  CT scan with known obstructing stone in place with left ureteral stent in appropriate positioning.  She does have patchy hypoenhancement of bilateral kidneys concerning for pyelonephritis.  Discussed with patient her evaluation and CT imaging from the emergency department.  Discussed that since she does have budding yeast on her urinalysis, this is an indication for exchange of her ureteral stent at this time. Discussed the risks and benefits of this procedure and she would like to proceed.   Recommend that she continue empiric antibiotics at this time.  Please add fluconazole for antifungal coverage.  She will need a total of at least 14 days of culture specific antibiotics due to complicated urinary tract infection.  She will ultimately follow-up with [facility] for her definitive stone treatment.  - Plan for cystourethroscopy, left ureteral stent exchange today. Please keep patient NPO - Follow up urine culture. Continue empiric antibiotics. Please add Antifungal coverage due to budding yeast on UA. Will need at least 14 days of culture specific antibiotics. - Follow up for definitive stone treatment outpatient   Neila Gear, MD [facility] 10/31/2022, 6:28 AM

## 2022-10-31 NOTE — ED Provider Notes (Signed)
[place] EMERGENCY DEPARTMENT AT [facility] Provider Note   CSN: [id number] Arrival date & time: 10/30/22  2155     History  Chief Complaint  Patient presents with   Flank Pain    Left   Emesis    Jill Patel is a 27 y.o. female.  HPI   Patient with medical history including septic stone presenting with left lower side pain, started yesterday, came on suddenly, pain remains constant, associated nausea vomiting fevers and chills, still passing gas having normal bowel movements denies any bloody emesis or coffee-ground emesis denies any dark tarry stools or bloody stools, states she has some dysuria denies hematuria no vaginal discharge vaginal bleeding.  No other abdominal surgery other than stenting of the left ureter, states that this feels similar when she had her stone before.  Reviewed patient's chart was admitted on 03/12 discharged on 03/14, had left pyelonephritis with an obstructing stone, approximately 5 mm at the UVJ, underwent cystoscopy with left ureteral stents performed by Dr. Dimple Nanas and was discharged home.  Home Medications Prior to Admission medications   Medication Sig Start Date End Date Taking? Authorizing Provider  acetaminophen (TYLENOL) 500 MG tablet Take 1,000 mg by mouth as needed for fever or moderate pain. Patient not taking: Reported on 10/21/2022    [provider]  ibuprofen (ADVIL) 200 MG tablet Take 600 mg by mouth as needed for moderate pain. Patient not taking: Reported on 10/21/2022    [provider]      Allergies    Kiwi extract, Aleve [naproxen sodium], Mango flavor, Other, and Pineapple    Review of Systems   Review of Systems  Constitutional:  Negative for chills and fever.  Respiratory:  Negative for shortness of breath.   Cardiovascular:  Negative for chest pain.  Gastrointestinal:  Negative for abdominal pain.  Genitourinary:  Positive for dysuria and flank pain.  Neurological:   Negative for headaches.    Physical Exam Updated Vital Signs BP 111/65   Pulse (!) 110   Temp (!) 102 F (38.9 C) (Rectal)   Resp 16   Ht 5\' 3"  (1.6 m)   Wt 50.8 kg   LMP 10/22/2022 Comment: negative urine pregnancy test 10/30/22  SpO2 100%   BMI 19.84 kg/m  Physical Exam Vitals and nursing note reviewed.  Constitutional:      General: She is not in acute distress.    Appearance: She is not ill-appearing.  HENT:     Head: Normocephalic and atraumatic.     Nose: No congestion.  Eyes:     Conjunctiva/sclera: Conjunctivae normal.  Cardiovascular:     Rate and Rhythm: Regular rhythm. Tachycardia present.     Pulses: Normal pulses.     Heart sounds: No murmur heard.    No friction rub. No gallop.  Pulmonary:     Effort: No respiratory distress.     Breath sounds: No wheezing, rhonchi or rales.  Abdominal:     Palpations: Abdomen is soft.     Tenderness: There is abdominal tenderness. There is left CVA tenderness. There is no right CVA tenderness.     Comments: Abdomen nondistended, soft, notable tenderness in the left lower quadrant left flank and positive left CVA tenderness, she had no guarding, rebound tenderness, peritoneal sign.  Skin:    General: Skin is warm and dry.  Neurological:     Mental Status: She is alert.  Psychiatric:        Mood and Affect:  Mood normal.     ED Results / Procedures / Treatments   Labs (all labs ordered are listed, but only abnormal results are displayed) Labs Reviewed  CBC WITH DIFFERENTIAL/PLATELET - Abnormal; Notable for the following components:      Result Value   WBC 16.6 (*)    Hemoglobin 11.8 (*)    HCT 35.9 (*)    Neutro Abs 14.3 (*)    Abs Immature Granulocytes 0.08 (*)    All other components within normal limits  COMPREHENSIVE METABOLIC PANEL - Abnormal; Notable for the following components:   Potassium 3.3 (*)    Glucose, Bld 124 (*)    Calcium 8.8 (*)    All other components within normal limits  URINALYSIS,  ROUTINE W REFLEX MICROSCOPIC - Abnormal; Notable for the following components:   APPearance CLOUDY (*)    Hgb urine dipstick MODERATE (*)    Protein, ur 100 (*)    Nitrite POSITIVE (*)    Leukocytes,Ua LARGE (*)    Bacteria, UA RARE (*)    All other components within normal limits  CULTURE, BLOOD (ROUTINE X 2)  CULTURE, BLOOD (ROUTINE X 2)  URINE CULTURE  LIPASE, BLOOD  LACTIC ACID, PLASMA  PREGNANCY, URINE  CBC WITH DIFFERENTIAL/PLATELET  BASIC METABOLIC PANEL  MAGNESIUM  PHOSPHORUS    EKG None  Radiology CT ABDOMEN PELVIS W CONTRAST  Result Date: 10/31/2022 CLINICAL DATA:  Left flank pain and dysuria. Recent kidney infection. Status post cystography with left ureteral stent placement. EXAM: CT ABDOMEN AND PELVIS WITH CONTRAST TECHNIQUE: Multidetector CT imaging of the abdomen and pelvis was performed using the standard protocol following bolus administration of intravenous contrast. RADIATION DOSE REDUCTION: This exam was performed according to the departmental dose-optimization program which includes automated exposure control, adjustment of the mA and/or kV according to patient size and/or use of iterative reconstruction technique. CONTRAST:  100mL OMNIPAQUE IOHEXOL 300 MG/ML  SOLN COMPARISON:  10/09/2022. FINDINGS: Lower chest: No acute abnormality. Hepatobiliary: Scattered subcentimeter hypodensities are present in the liver, statistically most likely representing cysts or hemangiomas. No biliary ductal dilatation. The gallbladder is without stones. Pancreas: Unremarkable. No pancreatic ductal dilatation or surrounding inflammatory changes. Spleen: Normal in size without focal abnormality. Adrenals/Urinary Tract: The adrenal glands are within normal limits. There is patchy hypoenhancement in the kidneys bilaterally. Renal calculi are present bilaterally. Left ureteral stent is in place with no evidence of obstructive uropathy. There is a 5 mm calculus in the distal left ureter  adjacent to the ureteral stent. The bladder is unremarkable. Stomach/Bowel: Stomach is within normal limits. Appendix appears normal. No evidence of bowel wall thickening, distention, or inflammatory changes. No free air or pneumatosis. Vascular/Lymphatic: No significant vascular findings are present. Prominent lymph nodes are noted in the retroperitoneum at the level of the left kidney and are likely reactive. Reproductive: Uterus and bilateral adnexa are unremarkable. Other: A trace amount of free fluid is noted inferior to the cecum in the right lower quadrant. Musculoskeletal: No acute osseous abnormality. IMPRESSION: 1. Patchy hypoenhancement in the kidneys bilaterally, concerning for pyelonephritis. 2. Nonobstructive renal calculi bilaterally. Left ureteral stent in place with a 5 mm calculus in the distal left ureter. Electronically Signed   By: Thornell SartoriusLaura  Taylor M.D.   On: 10/31/2022 04:15    Procedures .Critical Care  Performed by: Carroll SageFaulkner, Mary [place] J, PA-C Authorized by: Carroll SageFaulkner, Eliette Drumwright J, PA-C   Critical care provider statement:    Critical care time (minutes):  30   Critical care was  necessary to treat or prevent imminent or life-threatening deterioration of the following conditions:  Sepsis   Critical care was time spent personally by me on the following activities:  Development of treatment plan with patient or surrogate, discussions with consultants, evaluation of patient's response to treatment, examination of patient, ordering and review of laboratory studies, ordering and review of radiographic studies, ordering and performing treatments and interventions, pulse oximetry, re-evaluation of patient's condition and review of old charts     Medications Ordered in ED Medications  acetaminophen (TYLENOL) tablet 650 mg (has no administration in time range)  oxyCODONE (Oxy IR/ROXICODONE) immediate release tablet 5 mg (has no administration in time range)  HYDROmorphone (DILAUDID) injection  0.5 mg (has no administration in time range)  senna-docusate (Senokot-S) tablet 1 tablet (has no administration in time range)  polyethylene glycol (MIRALAX / GLYCOLAX) packet 17 g (has no administration in time range)  lactated ringers 1,000 mL with potassium chloride 20 mEq infusion (has no administration in time range)  cefTRIAXone (ROCEPHIN) 2 g in sodium chloride 0.9 % 100 mL IVPB (has no administration in time range)  cefTRIAXone (ROCEPHIN) 2 g in sodium chloride 0.9 % 100 mL IVPB (0 g Intravenous Stopped 10/31/22 0539)  sodium chloride 0.9 % bolus 1,000 mL (0 mLs Intravenous Stopped 10/31/22 0539)  HYDROmorphone (DILAUDID) injection 1 mg (1 mg Intravenous Given 10/31/22 0341)  ondansetron (ZOFRAN) injection 4 mg (4 mg Intravenous Given 10/31/22 0340)  iohexol (OMNIPAQUE) 300 MG/ML solution 100 mL (100 mLs Intravenous Contrast Given 10/31/22 0350)  acetaminophen (TYLENOL) tablet 1,000 mg (1,000 mg Oral Given 10/31/22 0546)  sodium chloride 0.9 % bolus 1,000 mL (1,000 mLs Intravenous New Bag/Given 10/31/22 0550)    ED Course/ Medical Decision Making/ A&P                             Medical Decision Making Amount and/or Complexity of Data Reviewed Labs: ordered.  Risk OTC drugs. Prescription drug management. Decision regarding hospitalization.   This patient presents to the ED for concern of left-sided flank pain, this involves an extensive number of treatment options, and is a complaint that carries with it a high risk of complications and morbidity.  The differential diagnosis includes septic stone, pyelo-, intra-abdominal infection, diverticulitis    Additional history obtained:  Additional history obtained from partner at bedside External records from outside source obtained and reviewed including urology notes, recent hospitalization   Co morbidities that complicate the patient evaluation  Septic stone  Social Determinants of Health:  No primary care provider    Lab  Tests:  I Ordered, and personally interpreted labs.  The pertinent results include: CBC shows leukocytosis of 16.6, hemoglobin 11.8, CMP reveals potassium 3.3, glucose 124, calcium 8.1, UA positive for nitrates leukocytes red blood cells white blood cells rare bacteria, urine pregnancy is negative lactic 1.1 blood cultures pending   Imaging Studies ordered:  I ordered imaging studies including CT abdomen pelvis I independently visualized and interpreted imaging which showed bilateral pyelonephritis I agree with the radiologist interpretation   Cardiac Monitoring:  The patient was maintained on a cardiac monitor.  I personally viewed and interpreted the cardiac monitored which showed an underlying rhythm of: N/A   Medicines ordered and prescription drug management:  I ordered medication including fluids, pain medication, antibiotics I have reviewed the patients home medicines and have made adjustments as needed      Reevaluation:  Presenting with left-sided flank  pain, triage obtain lab work which I personally reviewed, concerning for pyelo-/septic stone, will send down for CT abdomen pelvis for further evaluation, started on fluids, antibiotics, and continue to monitor.  CT scan shows pyelonephritis, due to the recent stenting, will consult with urology for further recommendations.  Reassessed the patient, despite a liter of fluids, she remains tachycardic, and has a rectal temperature of 102, will provide her with Tylenol, admit to medicine.    Consultations Obtained:  I requested consultation with Dr. Reuel Boom urology,  and discussed lab and imaging findings as well as pertinent plan - they recommend: Stent would not be removed, will treat pyelonephritis as you normally would.  If admit to medicine please repeat so they can round on the patient Spoke with Dr. Margo Aye hospitalist team will admit the patient    Test Considered:  N/A    Rule out low suspicion for lower  lobe pneumonia as lung sounds are clear bilaterally, will defer imaging at this time.  I have low suspicion for liver or gallbladder abnormality as she has no right upper quadrant tenderness, liver enzymes, alk phos, T bili all within normal limits.  Low suspicion for pancreatitis as lipase is within normal limits.  Low suspicion for ruptured stomach ulcer as she has no peritoneal sign present on exam.  Low suspicion for bowel obstruction as abdomen is nondistended normal bowel sounds, so passing gas and having normal bowel movements.  Suspicion for diverticulitis, bowel obstruction, volvulus, septic stone as of this time CT imaging of these findings.     Dispostion and problem list  After consideration of the diagnostic results and the patients response to treatment, I feel that the patent would benefit from admission.  Bilateral pyelonephritis-started on antibiotics, fluids, will need further monitoring, likely need formal consultation by urology due to stent on the left side.            Final Clinical Impression(s) / ED Diagnoses Final diagnoses:  Pyelonephritis    Rx / DC Orders ED Discharge Orders     None         Carroll Sage, PA-C 10/31/22 0618    Gilda Crease, MD 10/31/22 [phone]

## 2022-10-31 NOTE — ED Notes (Signed)
Called LAB to add Urine Culture

## 2022-10-31 NOTE — Transfer of Care (Signed)
Immediate Anesthesia Transfer of Care Note  Patient: Jill Patel  Procedure(s) Performed: CYSTOSCOPY WITH LEFT STENT EXCHANGE (Left)  Patient Location: PACU  Anesthesia Type:General  Level of Consciousness: awake, alert , and oriented  Airway & Oxygen Therapy: Patient Spontanous Breathing and Patient connected to nasal cannula oxygen  Post-op Assessment: Report given to RN and Post -op Vital signs reviewed and stable  Post vital signs: Reviewed and stable  Last Vitals:  Vitals Value Taken Time  BP 82/51 10/31/22 1258  Temp    Pulse 87 10/31/22 1300  Resp 16 10/31/22 1300  SpO2 100 % 10/31/22 1300  Vitals shown include unvalidated device data.  Last Pain:  Vitals:   10/31/22 1005  TempSrc: Oral  PainSc: 8          Complications: No notable events documented.

## 2022-10-31 NOTE — Progress Notes (Signed)
[facility] Progress Note  Patient: Jill Patel     OVZ:[id number]  DOA: 10/30/2022   PCP: Pcp, No       Brief hospital course: 11 y y/o female with h/o infected obstruction 5 mm UVJ stone and left ureteral stent placement 10/01/22 by Dr Marlou Porch presented to [facility] for nausea left flank pain starting at 5 PM on the day of presentation.   In the ED:  T max 102, HR 137, BP as low as 95/48 and 84/41, WBC 16.6 UA > large leukocytes, rare bacteria, 21-50 RBC, + nitrite, yeast, mucus CT abd/pelv with contrast: 1. Patchy hypoenhancement in the kidneys bilaterally, concerning for pyelonephritis. 2. Nonobstructive renal calculi bilaterally. Left ureteral stent in place with a 5 mm calculus in the distal left ureter.  Given Ceftriaxone in the ED.  Previously has grown our Staph saprophyticus (on multiple occasions in 2023) Per Urology, she requires a ureteral exchange and 14 days of an antibiotic.   Subjective:  Having nausea and left flank pain.   Assessment and Plan: Principal Problem:   Sepsis secondary to UTI Pyelonephritis  - urine culture pending- she is receiving Fluconazole and Ceftriaxone - f/u WBC which are improving  Active Problems: Hypokalemia/hypomagnesemia - resolved  Nausea/ h/o prolonged QT - QTc is normal - PRN Zofran ordered - start NS at 75cc/hr  Alpha thalassemia carrier    Code Status: Full Code Consultants: urology Level of Care: Level of care: Progressive Total time on patient care: 30 min DVT prophylaxis:  SCDs Start: 10/31/22 0548     Objective:   Vitals:   10/31/22 1345 10/31/22 1400 10/31/22 1403 10/31/22 1620  BP: (!) 96/53  101/61 114/73  Pulse: 82  79 68  Resp: 12  20 20   Temp: 97.8 F (36.6 C)  98.3 F (36.8 C) 97.7 F (36.5 C)  TempSrc:   Oral Oral  SpO2: 100%  100% 100%  Weight:  55.5 kg    Height:  5\' 3"  (1.6 m)     Filed Weights   10/30/22 2300 10/31/22 1400  Weight: 50.8 kg 55.5 kg   Exam: General exam:  Appears comfortable  HEENT: oral mucosa moist Respiratory system: Clear to auscultation.  Cardiovascular system: S1 & S2 heard  Gastrointestinal system: Abdomen soft, non-tender, nondistended. Normal bowel sounds   Extremities: No cyanosis, clubbing or edema Psychiatry:  Mood & affect appropriate.      CBC: Recent Labs  Lab 10/30/22 2300 10/31/22 0633  WBC 16.6* 12.5*  NEUTROABS 14.3* 11.3*  HGB 11.8* 9.4*  HCT 35.9* 28.3*  MCV 89.3 91.3  PLT 237 172   Basic Metabolic Panel: Recent Labs  Lab 10/30/22 2300 10/31/22 0633 10/31/22 1331  NA 136 133* 138  K 3.3* 3.0* 4.1  CL 105 106 110  CO2 24 21* 22  GLUCOSE 124* 141* 106*  BUN 14 10 7   CREATININE 0.77 0.68 0.66  CALCIUM 8.8* 7.2* 7.6*  MG  --  1.6* 3.1*  PHOS  --  2.6  --    GFR: Estimated Creatinine Clearance: 89.7 mL/min (by C-G formula based on SCr of 0.66 mg/dL).  Scheduled Meds:  senna-docusate  1 tablet Oral QHS   Continuous Infusions:  [START ON 11/01/2022] cefTRIAXone (ROCEPHIN)  IV     [START ON 11/01/2022] fluconazole (DIFLUCAN) IV     lactated ringers 1,000 mL with potassium chloride 20 mEq infusion 100 mL/hr at 10/31/22 1516   Imaging and lab data was personally reviewed DG C-Arm 1-60 Min-No Report  Result Date: 10/31/2022 Fluoroscopy was utilized by the requesting physician.  No radiographic interpretation.   CT ABDOMEN PELVIS W CONTRAST  Result Date: 10/31/2022 CLINICAL DATA:  Left flank pain and dysuria. Recent kidney infection. Status post cystography with left ureteral stent placement. EXAM: CT ABDOMEN AND PELVIS WITH CONTRAST TECHNIQUE: Multidetector CT imaging of the abdomen and pelvis was performed using the standard protocol following bolus administration of intravenous contrast. RADIATION DOSE REDUCTION: This exam was performed according to the departmental dose-optimization program which includes automated exposure control, adjustment of the mA and/or kV according to patient size and/or use  of iterative reconstruction technique. CONTRAST:  OMNIPAQUE IOHEXOL 300 MG/ML  SOLN COMPARISON:  10/09/2022. FINDINGS: Lower chest: No acute abnormality. Hepatobiliary: Scattered subcentimeter hypodensities are present in the liver, statistically most likely representing cysts or hemangiomas. No biliary ductal dilatation. The gallbladder is without stones. Pancreas: Unremarkable. No pancreatic ductal dilatation or surrounding inflammatory changes. Spleen: Normal in size without focal abnormality. Adrenals/Urinary Tract: The adrenal glands are within normal limits. There is patchy hypoenhancement in the kidneys bilaterally. Renal calculi are present bilaterally. Left ureteral stent is in place with no evidence of obstructive uropathy. There is a 5 mm calculus in the distal left ureter adjacent to the ureteral stent. The bladder is unremarkable. Stomach/Bowel: Stomach is within normal limits. Appendix appears normal. No evidence of bowel wall thickening, distention, or inflammatory changes. No free air or pneumatosis. Vascular/Lymphatic: No significant vascular findings are present. Prominent lymph nodes are noted in the retroperitoneum at the level of the left kidney and are likely reactive. Reproductive: Uterus and bilateral adnexa are unremarkable. Other: A trace amount of free fluid is noted inferior to the cecum in the right lower quadrant. Musculoskeletal: No acute osseous abnormality. IMPRESSION: 1. Patchy hypoenhancement in the kidneys bilaterally, concerning for pyelonephritis. 2. Nonobstructive renal calculi bilaterally. Left ureteral stent in place with a 5 mm calculus in the distal left ureter. Electronically Signed   By: Thornell Sartorius M.D.   On: 10/31/2022 04:15    LOS: 0 days   Author: Calvert Cantor  10/31/2022 4:45 PM  To contact [facility]>   Check the care team in [facility] and look for the attending/consulting [facility] provider listed  Log into [email or web address] and use [place]'s universal  password   Go to> "[facility]"  and find provider  If you still have difficulty reaching the provider, please page the [facility] (Director on Call) for the Hospitalists listed on amion

## 2022-10-31 NOTE — ED Notes (Signed)
ED TO INPATIENT HANDOFF REPORT  Name/Age/Gender Jill Patel 27 y.o. female  Code Status    Code Status Orders  (From admission, onward)           Start     Ordered   10/31/22 0548  Full code  Continuous       Question:  By:  Answer:  Consent: discussion documented in EHR   10/31/22 0547           Code Status History     Date Active Date Inactive Code Status Order ID Comments User Context   10/01/2022 2339 10/03/2022 1824 Full Code [id number]  Angie Fava, DO ED   04/16/2022 2009 04/18/2022 1706 Full Code [id number]  Marylene Land, CNM Inpatient   04/15/2022 1711 04/16/2022 2002 Full Code [id number]  Myrtie Hawk, DO Inpatient       Home/SNF/Other Home  Chief Complaint Pyelonephritis [N12]  Level of Care/Admitting Diagnosis ED Disposition     ED Disposition  Admit   Condition  --   Comment  Hospital Area: [facility] [[id number]]  Level of Care: Progressive [102]  Admit to Progressive based on following criteria: NEPHROLOGY stable condition requiring close monitoring for AKI, requiring Hemodialysis or Peritoneal Dialysis either from expected electrolyte imbalance, acidosis, or fluid overload that can be managed by NIPPV or high flow oxygen.  May admit patient to [facility] or [facility] if equivalent level of care is available:: Yes  Covid Evaluation: Asymptomatic - no recent exposure (last 10 days) testing not required  Diagnosis: Pyelonephritis [194174]  Admitting Physician: Darlin Drop [[id number]]  Attending Physician: Darlin Drop [[id number]]  Certification:: I certify this patient will need inpatient services for at least 2 midnights  Estimated Length of Stay: 2          Medical History Past Medical History:  Diagnosis Date   Anemia    Anxiety    Asthma    Depression    never on meds   Headache    Heart murmur    at birth   History of kidney stones    Kidney cysts    Ovarian cyst     Scoliosis     Allergies Allergies  Allergen Reactions   Kiwi Extract Anaphylaxis   Aleve [Naproxen Sodium] Other (See Comments)    Stomach bleeds. Not allergic per Pt    Mango Flavor Other (See Comments)    Scratchy throat   Other Other (See Comments)    Eggplant - scratchy throat    Pineapple Other (See Comments)    Scratchy throat     IV Location/Drains/Wounds Patient Lines/Drains/Airways Status     Active Line/Drains/Airways     Name Placement date Placement time Site Days   Peripheral IV 10/31/22 20 G Anterior;Left;Proximal Forearm 10/31/22  0335  Forearm  less than 1   Ureteral Drain/Stent Left ureter 6 Fr. 10/02/22  0021  Left ureter  29            Labs/Imaging Results for orders placed or performed during the hospital encounter of 10/30/22 (from the past 48 hour(s))  Pregnancy, urine     Status: None   Collection Time: 10/30/22 12:34 AM  Result Value Ref Range   Preg Test, Ur NEGATIVE NEGATIVE    Comment:        THE SENSITIVITY OF THIS METHODOLOGY IS >20 mIU/mL. Performed at [facility], [address], [place], [place] [address]   CBC with Differential  Status: Abnormal   Collection Time: 10/30/22 11:00 PM  Result Value Ref Range   WBC 16.6 (H) 4.0 - 10.5 K/uL   RBC 4.02 3.87 - 5.11 MIL/uL   Hemoglobin 11.8 (L) 12.0 - 15.0 g/dL   HCT 16.135.9 (L) 09.636.0 - 04.546.0 %   MCV 89.3 80.0 - 100.0 fL   MCH 29.4 26.0 - 34.0 pg   MCHC 32.9 30.0 - 36.0 g/dL   RDW 40.912.4 81.111.5 - 91.415.5 %   Platelets 237 150 - 400 K/uL   nRBC 0.0 0.0 - 0.2 %   Neutrophils Relative % 85 %   Neutro Abs 14.3 (H) 1.7 - 7.7 K/uL   Lymphocytes Relative 8 %   Lymphs Abs 1.3 0.7 - 4.0 K/uL   Monocytes Relative 6 %   Monocytes Absolute 0.9 0.1 - 1.0 K/uL   Eosinophils Relative 0 %   Eosinophils Absolute 0.0 0.0 - 0.5 K/uL   Basophils Relative 0 %   Basophils Absolute 0.1 0.0 - 0.1 K/uL   Immature Granulocytes 1 %   Abs Immature Granulocytes 0.08 (H) 0.00 - 0.07 K/uL     Comment: Performed at [facility], [address] [place], [place] [address]  Comprehensive metabolic panel     Status: Abnormal   Collection Time: 10/30/22 11:00 PM  Result Value Ref Range   Sodium 136 135 - 145 mmol/L   Potassium 3.3 (L) 3.5 - 5.1 mmol/L   Chloride 105 98 - 111 mmol/L   CO2 24 22 - 32 mmol/L   Glucose, Bld 124 (H) 70 - 99 mg/dL    Comment: Glucose reference range applies only to samples taken after fasting for at least 8 hours.   BUN 14 6 - 20 mg/dL   Creatinine, Ser 6.210.77 0.44 - 1.00 mg/dL   Calcium 8.8 (L) 8.9 - 10.3 mg/dL   Total Protein 7.8 6.5 - 8.1 g/dL   Albumin 4.4 3.5 - 5.0 g/dL   AST 21 15 - 41 U/L   ALT 16 0 - 44 U/L   Alkaline Phosphatase 85 38 - 126 U/L   Total Bilirubin 0.7 0.3 - 1.2 mg/dL   GFR, Estimated >30>60 >86>60 mL/min    Comment: (NOTE) Calculated using the CKD-EPI Creatinine Equation (2021)    Anion gap 7 5 - 15    Comment: Performed at [facility], [address] [place], [place] [address]  Lipase, blood     Status: None   Collection Time: 10/30/22 11:00 PM  Result Value Ref Range   Lipase 28 11 - 51 U/L    Comment: Performed at [facility], [address] [place], [place] [address]  Lactic acid, plasma     Status: None   Collection Time: 10/30/22 11:00 PM  Result Value Ref Range   Lactic Acid, Venous 1.1 0.5 - 1.9 mmol/L    Comment: Performed at [facility], [address] [place], [place] [address]  Urinalysis, Routine w reflex microscopic -Urine, Clean Catch     Status: Abnormal   Collection Time: 10/30/22 11:09 PM  Result Value Ref Range   Color, Urine YELLOW YELLOW   APPearance CLOUDY (A) CLEAR   Specific Gravity, Urine 1.013 1.005 - 1.030   pH 6.0 5.0 - 8.0   Glucose, UA NEGATIVE NEGATIVE mg/dL   Hgb urine dipstick MODERATE (A) NEGATIVE   Bilirubin Urine NEGATIVE NEGATIVE   Ketones, ur NEGATIVE NEGATIVE mg/dL   Protein, ur 244100 (A) NEGATIVE  mg/dL    Nitrite POSITIVE (A) NEGATIVE   Leukocytes,Ua LARGE (A) NEGATIVE   RBC / HPF 21-50 0 - 5 RBC/hpf   WBC, UA >50 0 - 5 WBC/hpf   Bacteria, UA RARE (A) NONE SEEN   Squamous Epithelial / HPF 0-5 0 - 5 /HPF   WBC Clumps PRESENT    Mucus PRESENT    Budding Yeast PRESENT     Comment: Performed at [facility], [address] [place], [place] [address]  CBC with Differential/Platelet     Status: Abnormal   Collection Time: 10/31/22  6:33 AM  Result Value Ref Range   WBC 12.5 (H) 4.0 - 10.5 K/uL   RBC 3.10 (L) 3.87 - 5.11 MIL/uL   Hemoglobin 9.4 (L) 12.0 - 15.0 g/dL   HCT 60.4 (L) 54.0 - 98.1 %   MCV 91.3 80.0 - 100.0 fL   MCH 30.3 26.0 - 34.0 pg   MCHC 33.2 30.0 - 36.0 g/dL   RDW 19.1 47.8 - 29.5 %   Platelets 172 150 - 400 K/uL   nRBC 0.0 0.0 - 0.2 %   Neutrophils Relative % 91 %   Neutro Abs 11.3 (H) 1.7 - 7.7 K/uL   Lymphocytes Relative 7 %   Lymphs Abs 0.9 0.7 - 4.0 K/uL   Monocytes Relative 2 %   Monocytes Absolute 0.3 0.1 - 1.0 K/uL   Eosinophils Relative 0 %   Eosinophils Absolute 0.0 0.0 - 0.5 K/uL   Basophils Relative 0 %   Basophils Absolute 0.0 0.0 - 0.1 K/uL   Immature Granulocytes 0 %   Abs Immature Granulocytes 0.05 0.00 - 0.07 K/uL    Comment: Performed at [facility], [address], [place], [place] [address]  Basic metabolic panel     Status: Abnormal   Collection Time: 10/31/22  6:33 AM  Result Value Ref Range   Sodium 133 (L) 135 - 145 mmol/L   Potassium 3.0 (L) 3.5 - 5.1 mmol/L   Chloride 106 98 - 111 mmol/L   CO2 21 (L) 22 - 32 mmol/L   Glucose, Bld 141 (H) 70 - 99 mg/dL    Comment: Glucose reference range applies only to samples taken after fasting for at least 8 hours.   BUN 10 6 - 20 mg/dL   Creatinine, Ser 8.65 0.44 - 1.00 mg/dL   Calcium 7.2 (L) 8.9 - 10.3 mg/dL   GFR, Estimated >78 >46 mL/min    Comment: (NOTE) Calculated using the CKD-EPI Creatinine Equation (2021)    Anion gap 6 5 - 15    Comment:  Performed at [facility], [address], [place], [place] [address]  Magnesium     Status: Abnormal   Collection Time: 10/31/22  6:33 AM  Result Value Ref Range   Magnesium 1.6 (L) 1.7 - 2.4 mg/dL    Comment: Performed at [facility], [address] [place], [place] [address]  Phosphorus     Status: None   Collection Time: 10/31/22  6:33 AM  Result Value Ref Range   Phosphorus 2.6 2.5 - 4.6 mg/dL    Comment: Performed at [facility], [address] [place], [place] [address]   CT ABDOMEN PELVIS W CONTRAST  Result Date: 10/31/2022 CLINICAL DATA:  Left flank pain and dysuria. Recent kidney infection. Status post cystography with left ureteral stent placement. EXAM: CT ABDOMEN AND PELVIS WITH CONTRAST TECHNIQUE: Multidetector CT imaging of the abdomen and  pelvis was performed using the standard protocol following bolus administration of intravenous contrast. RADIATION DOSE REDUCTION: This exam was performed according to the departmental dose-optimization program which includes automated exposure control, adjustment of the mA and/or kV according to patient size and/or use of iterative reconstruction technique. CONTRAST:  OMNIPAQUE IOHEXOL 300 MG/ML  SOLN COMPARISON:  10/09/2022. FINDINGS: Lower chest: No acute abnormality. Hepatobiliary: Scattered subcentimeter hypodensities are present in the liver, statistically most likely representing cysts or hemangiomas. No biliary ductal dilatation. The gallbladder is without stones. Pancreas: Unremarkable. No pancreatic ductal dilatation or surrounding inflammatory changes. Spleen: Normal in size without focal abnormality. Adrenals/Urinary Tract: The adrenal glands are within normal limits. There is patchy hypoenhancement in the kidneys bilaterally. Renal calculi are present bilaterally. Left ureteral stent is in place with no evidence of obstructive uropathy. There is a 5 mm calculus in the  distal left ureter adjacent to the ureteral stent. The bladder is unremarkable. Stomach/Bowel: Stomach is within normal limits. Appendix appears normal. No evidence of bowel wall thickening, distention, or inflammatory changes. No free air or pneumatosis. Vascular/Lymphatic: No significant vascular findings are present. Prominent lymph nodes are noted in the retroperitoneum at the level of the left kidney and are likely reactive. Reproductive: Uterus and bilateral adnexa are unremarkable. Other: A trace amount of free fluid is noted inferior to the cecum in the right lower quadrant. Musculoskeletal: No acute osseous abnormality. IMPRESSION: 1. Patchy hypoenhancement in the kidneys bilaterally, concerning for pyelonephritis. 2. Nonobstructive renal calculi bilaterally. Left ureteral stent in place with a 5 mm calculus in the distal left ureter. Electronically Signed   By: Thornell Sartorius M.D.   On: 10/31/2022 04:15    Pending Labs Unresulted Labs (From admission, onward)     Start     Ordered   10/31/22 1400  Basic metabolic panel  Once,   R        10/31/22 0744   10/31/22 1400  Magnesium  Once-Timed,   TIMED        10/31/22 0744   10/31/22 0555  Urine Culture (for pregnant, neutropenic or urologic patients or patients with an indwelling urinary catheter)  (Urine Labs)  Once,   R       Question:  Indication  Answer:  Flank Pain   10/31/22 0554   10/30/22 2309  Blood culture (routine x 2)  BLOOD CULTURE X 2,   R (with STAT occurrences)      10/30/22 2309            Vitals/Pain Today's Vitals   10/31/22 0423 10/31/22 0534 10/31/22 0615 10/31/22 0630  BP:   (!) 105/52 (!) 95/48  Pulse:   (!) 122 (!) 128  Resp:   19 18  Temp:  (!) 102 F (38.9 C)  (!) 102 F (38.9 C)  TempSrc:  Rectal  Oral  SpO2:   98% 95%  Weight:      Height:      PainSc: 0-No pain   0-No pain    Isolation Precautions No active isolations  Medications Medications  acetaminophen (TYLENOL) tablet 650 mg (has no  administration in time range)  oxyCODONE (Oxy IR/ROXICODONE) immediate release tablet 5 mg (has no administration in time range)  HYDROmorphone (DILAUDID) injection 0.5 mg (has no administration in time range)  senna-docusate (Senokot-S) tablet 1 tablet (has no administration in time range)  polyethylene glycol (MIRALAX / GLYCOLAX) packet 17 g (has no administration in time range)  lactated ringers 1,000 mL with potassium  chloride 20 mEq infusion ( Intravenous New Bag/Given 10/31/22 0629)  cefTRIAXone (ROCEPHIN) 2 g in sodium chloride 0.9 % 100 mL IVPB (has no administration in time range)  magnesium sulfate IVPB 4 g 100 mL (has no administration in time range)  potassium chloride 10 mEq in 100 mL IVPB (has no administration in time range)  cefTRIAXone (ROCEPHIN) 2 g in sodium chloride 0.9 % 100 mL IVPB (0 g Intravenous Stopped 10/31/22 0539)  sodium chloride 0.9 % bolus 1,000 mL (0 mLs Intravenous Stopped 10/31/22 0539)  HYDROmorphone (DILAUDID) injection 1 mg (1 mg Intravenous Given 10/31/22 0341)  ondansetron (ZOFRAN) injection 4 mg (4 mg Intravenous Given 10/31/22 0340)  iohexol (OMNIPAQUE) 300 MG/ML solution 100 mL (100 mLs Intravenous Contrast Given 10/31/22 0350)  acetaminophen (TYLENOL) tablet 1,000 mg (1,000 mg Oral Given 10/31/22 0546)  sodium chloride 0.9 % bolus 1,000 mL (1,000 mLs Intravenous New Bag/Given 10/31/22 0550)    Mobility walks

## 2022-10-31 NOTE — Progress Notes (Addendum)
Pharmacy Antibiotic Note  Jill Patel is a 27 y.o. female admitted on 10/31/2022 with urosepsis, obstructing UVJ stone s/p L ureteral stent 10/01/2022.  U/A showed bacteria and budding yeast.  Given concern for candidal UTI, urology consulted pharmacy for fluconazole dosing.  Recommendation from urology is for at least 14 days of culture specific antimicrobials. Also received empiric ceftriaxone 2 grams x1 in ED.    Of note, during recent hospitalization EKG on 10/01/22 demonstrated a prolonged QTc (546 msec).  This Clinical research associate does not currently have access to results from subsequent EKGs.  Fluconazole can further prolong the QTc. Its use in this setting could place patient at risk for ventricular arrhythmias including torades de pointes.  Plan:  Given risk of arrhythmia from fluconazole, begin antifungal therapy with micafungin 100 mg IV stat, then q24h.  Follow up on subsequent EKGs to assess potential for transition to PO fluconazole.   Height: 5\' 3"  (160 cm) Weight: 50.8 kg (112 lb) IBW/kg (Calculated) : 52.4  Temp (24hrs), Avg:100.1 F (37.8 C), Min:98.2 F (36.8 C), Max:102 F (38.9 C)  Recent Labs  Lab 10/30/22 2300 10/31/22 0633  WBC 16.6* 12.5*  CREATININE 0.77 0.68  LATICACIDVEN 1.1  --     Estimated Creatinine Clearance: 87 mL/min (by C-G formula based on SCr of 0.68 mg/dL).    Allergies  Allergen Reactions   Kiwi Extract Anaphylaxis   Aleve [Naproxen Sodium] Other (See Comments)    Stomach bleeds. Not allergic per Pt    Mango Flavor Other (See Comments)    Scratchy throat   Other Other (See Comments)    Eggplant - scratchy throat    Pineapple Other (See Comments)    Scratchy throat     Antimicrobials this admission: 4/11 ceftriaxone 4/11 micafungin  Dose adjustments this admission:  Microbiology results: 4/11 blood x 2: collected 4/10 urine x 1: collected   Thank you for allowing pharmacy to be a part of this patient's care.  Elie Goody,  PharmD, BCPS Clinical Pharmacist 10/31/2022  8:34 AM

## 2022-10-31 NOTE — H&P (Addendum)
History and Physical  Jill Patel ZOX:[id number] DOB: 09/16/1995 DOA: 10/30/2022  Referring physician: Donnella ShamFaulkner, PA-EDP  PCP: Pcp, No  Outpatient Specialists: Urology. Patient coming from: Home.  Chief Complaint: Left flank pain  HPI: Jill LevelsShammaryah Shambley is a 27 y.o. female with medical history significant for nephrolithiasis status post left stent placement who [facility] from home due to severe left flank pain that started today around 5 PM.  In the ED, febrile with Tmax 102, tachycardic with heart rate of 137, leukocytosis with WBC 16.6 K.  UA positive for pyuria.  CT abdomen pelvis with contrast revealed: 1. Patchy hypoenhancement in the kidneys bilaterally, concerning for pyelonephritis. 2. Nonobstructive renal calculi bilaterally. Left ureteral stent in place with a 5 mm calculus in the distal left ureter.  Urine culture and peripheral blood cultures x 2 were ordered.  The patient was started on Rocephin empirically.  Additionally received 2 L IV fluid hydration per sepsis protocol.  EDP discussed the case with urology who recommended hospitalist admission.  Urology will see in consultation.  [facility], hospitalist service, was asked to admit.  ED Course: Tmax 102.  BP 108/72, pulse 137, respiration rate 20, O2 saturation 100% on room air.  Lab studies remarkable for 16.6 K, hemoglobin 11.8.  Serum potassium 3.3, glucose 124.  Review of Systems: Review of systems as noted in the HPI. All other systems reviewed and are negative.   Past Medical History:  Diagnosis Date   Anemia    Anxiety    Asthma    Depression    never on meds   Headache    Heart murmur    at birth   History of kidney stones    Kidney cysts    Ovarian cyst    Scoliosis    Past Surgical History:  Procedure Laterality Date   CYSTOSCOPY W/ URETERAL STENT PLACEMENT Left 10/01/2022   Procedure: CYSTOSCOPY WITH RETROGRADE PYELOGRAM/URETERAL STENT PLACEMENT;  Surgeon: Crist FatHerrick, Benjamin W, MD;   Location: [facility] ORS;  Service: Urology;  Laterality: Left;   TONSILLECTOMY      Social History:  reports that she has quit smoking. Her smoking use included cigars. She has never been exposed to tobacco smoke. She has never used smokeless tobacco. She reports current alcohol use. She reports that she does not use drugs.   Allergies  Allergen Reactions   Kiwi Extract Anaphylaxis   Aleve [Naproxen Sodium] Other (See Comments)    Stomach bleeds. Not allergic per Pt    Mango Flavor Other (See Comments)    Scratchy throat   Other Other (See Comments)    Eggplant - scratchy throat    Pineapple Other (See Comments)    Scratchy throat     Family History  Problem Relation Age of Onset   Healthy Mother    Healthy Father    Diabetes Maternal Grandfather    Heart disease Maternal Grandfather    Cancer Maternal Grandfather       Prior to Admission medications   Medication Sig Start Date End Date Taking? Authorizing Provider  acetaminophen (TYLENOL) 500 MG tablet Take 1,000 mg by mouth as needed for fever or moderate pain. Patient not taking: Reported on 10/21/2022    [provider]  ibuprofen (ADVIL) 200 MG tablet Take 600 mg by mouth as needed for moderate pain. Patient not taking: Reported on 10/21/2022    [provider]    Physical Exam: BP 111/65   Pulse (!) 110   Temp (!) 102  F (38.9 C) (Rectal)   Resp 16   Ht 5\' 3"  (1.6 m)   Wt 50.8 kg   LMP 10/22/2022 Comment: negative urine pregnancy test 10/30/22  SpO2 100%   BMI 19.84 kg/m   General: 27 y.o. year-old female well developed well nourished in no acute distress.  Alert and oriented x3. Cardiovascular: Tachycardia with no rubs or gallops.  No thyromegaly or JVD noted.  No lower extremity edema. 2/4 pulses in all 4 extremities. Respiratory: Clear to auscultation with no wheezes or rales. Good inspiratory effort. Abdomen: Soft nondistended with normal bowel sounds x4 quadrants.  Left flank  tenderness. Muskuloskeletal: No cyanosis, clubbing or edema noted bilaterally Neuro: CN II-XII intact, strength, sensation, reflexes Skin: No ulcerative lesions noted or rashes Psychiatry: Judgement and insight appear normal. Mood is appropriate for condition and setting          Labs on Admission:  Basic Metabolic Panel: Recent Labs  Lab 10/30/22 2300  NA 136  K 3.3*  CL 105  CO2 24  GLUCOSE 124*  BUN 14  CREATININE 0.77  CALCIUM 8.8*   Liver Function Tests: Recent Labs  Lab 10/30/22 2300  AST 21  ALT 16  ALKPHOS 85  BILITOT 0.7  PROT 7.8  ALBUMIN 4.4   Recent Labs  Lab 10/30/22 2300  LIPASE 28   No results for input(s): "AMMONIA" in the last 168 hours. CBC: Recent Labs  Lab 10/30/22 2300  WBC 16.6*  NEUTROABS 14.3*  HGB 11.8*  HCT 35.9*  MCV 89.3  PLT 237   Cardiac Enzymes: No results for input(s): "CKTOTAL", "CKMB", "CKMBINDEX", "TROPONINI" in the last 168 hours.  BNP (last 3 results) No results for input(s): "BNP" in the last 8760 hours.  ProBNP (last 3 results) No results for input(s): "PROBNP" in the last 8760 hours.  CBG: No results for input(s): "GLUCAP" in the last 168 hours.  Radiological Exams on Admission: CT ABDOMEN PELVIS W CONTRAST  Result Date: 10/31/2022 CLINICAL DATA:  Left flank pain and dysuria. Recent kidney infection. Status post cystography with left ureteral stent placement. EXAM: CT ABDOMEN AND PELVIS WITH CONTRAST TECHNIQUE: Multidetector CT imaging of the abdomen and pelvis was performed using the standard protocol following bolus administration of intravenous contrast. RADIATION DOSE REDUCTION: This exam was performed according to the departmental dose-optimization program which includes automated exposure control, adjustment of the mA and/or kV according to patient size and/or use of iterative reconstruction technique. CONTRAST:  OMNIPAQUE IOHEXOL 300 MG/ML  SOLN COMPARISON:  10/09/2022. FINDINGS: Lower chest: No  acute abnormality. Hepatobiliary: Scattered subcentimeter hypodensities are present in the liver, statistically most likely representing cysts or hemangiomas. No biliary ductal dilatation. The gallbladder is without stones. Pancreas: Unremarkable. No pancreatic ductal dilatation or surrounding inflammatory changes. Spleen: Normal in size without focal abnormality. Adrenals/Urinary Tract: The adrenal glands are within normal limits. There is patchy hypoenhancement in the kidneys bilaterally. Renal calculi are present bilaterally. Left ureteral stent is in place with no evidence of obstructive uropathy. There is a 5 mm calculus in the distal left ureter adjacent to the ureteral stent. The bladder is unremarkable. Stomach/Bowel: Stomach is within normal limits. Appendix appears normal. No evidence of bowel wall thickening, distention, or inflammatory changes. No free air or pneumatosis. Vascular/Lymphatic: No significant vascular findings are present. Prominent lymph nodes are noted in the retroperitoneum at the level of the left kidney and are likely reactive. Reproductive: Uterus and bilateral adnexa are unremarkable. Other: A trace amount of free fluid is  noted inferior to the cecum in the right lower quadrant. Musculoskeletal: No acute osseous abnormality. IMPRESSION: 1. Patchy hypoenhancement in the kidneys bilaterally, concerning for pyelonephritis. 2. Nonobstructive renal calculi bilaterally. Left ureteral stent in place with a 5 mm calculus in the distal left ureter. Electronically Signed   By: Thornell Sartorius M.D.   On: 10/31/2022 04:15    EKG: I independently viewed the EKG done and my findings are as followed: None available at the time of the visit.  Assessment/Plan Present on Admission:  Pyelonephritis  Principal Problem:   Pyelonephritis  Sepsis secondary to bilateral pyelonephritis, seen on CT scan Presented with fever Tmax 102, tachycardia heart rate 137, UA positive for pyuria Status post  left ureteral stent placement Started on Rocephin empirically, continue Follow urine culture for ID and sensitivities Follow peripheral blood cultures x 2 Monitor fever curve and WBCs Maintain MAP greater than 65 Continue IV fluid hydration LR KCl 20 mEq at 100 cc/h x 1 day. Analgesics as needed Urology consulted by EDP  Nephrolithiasis, status post left ureteral stent placement Followed by urology Urology will see in consultation  Tachycardia in the setting of sepsis Continue to treat underlying conditions Continue IV fluid hydration Monitor on telemetry  Hypokalemia Serum potassium 3.3 Repleted intravenously via maintenance IV fluid hydration  Chronic normocytic anemia Hemoglobin 11.8K, appears to be at baseline Monitor H&H   DVT prophylaxis: SCDs  Code Status: Full code  Family Communication: Significant other at bedside  Disposition Plan: Admitted to progressive care unit  Consults called: Urology, Dr. Deno Etienne, consulted by EDP  Admission status: Inpatient status.   Status is: Inpatient The patient requires at least 2 midnights for further evaluation and treatment of present condition.   Darlin Drop MD [facility] Pager [phone]  If 7PM-7AM, please contact night-coverage [email or web address] Password [facility]  10/31/2022, 5:48 AM

## 2022-10-31 NOTE — ED Notes (Signed)
Patient transported to CT 

## 2022-10-31 NOTE — Anesthesia Preprocedure Evaluation (Addendum)
Anesthesia Evaluation  Patient identified by MRN, date of birth, ID band Patient awake    Reviewed: Allergy & Precautions, H&P , NPO status , Patient's Chart, lab work & pertinent test results  Airway Mallampati: III  TM Distance: >3 FB Neck ROM: Full    Dental no notable dental hx. (+) Teeth Intact, Dental Advisory Given   Pulmonary asthma , former smoker   Pulmonary exam normal breath sounds clear to auscultation       Cardiovascular negative cardio ROS  Rhythm:Regular Rate:Normal     Neuro/Psych  Headaches  Anxiety Depression       GI/Hepatic negative GI ROS, Neg liver ROS,,,  Endo/Other  negative endocrine ROS    Renal/GU negative Renal ROS  negative genitourinary   Musculoskeletal   Abdominal   Peds  Hematology  (+) Blood dyscrasia, anemia   Anesthesia Other Findings   Reproductive/Obstetrics negative OB ROS                             Anesthesia Physical Anesthesia Plan  ASA: 2  Anesthesia Plan: General   Post-op Pain Management: Tylenol PO (pre-op)*   Induction: Intravenous  PONV Risk Score and Plan: 4 or greater and Dexamethasone and Midazolam  Airway Management Planned: LMA  Additional Equipment:   Intra-op Plan:   Post-operative Plan: Extubation in OR  Informed Consent: I have reviewed the patients History and Physical, chart, labs and discussed the procedure including the risks, benefits and alternatives for the proposed anesthesia with the patient or authorized representative who has indicated his/her understanding and acceptance.     Dental advisory given  Plan Discussed with: CRNA  Anesthesia Plan Comments:        Anesthesia Quick Evaluation

## 2022-10-31 NOTE — Op Note (Signed)
Preoperative diagnosis:  Kidney infection - fungal   Postoperative diagnosis:  same   Procedure:  Cystoscopy left ureteral stent exchange left retrograde pyelography with interpretation   Surgeon: Crist Fat, MD  Anesthesia: General  Complications: None  Intraoperative findings:  left retrograde pyelography demonstrated a no specific filling defect.  24 cm stent was exchanged in the left ureter.  EBL: Minimal  Specimens: None  Indication: Jill Patel is a 27 y.o. patient with fungal UTI and fevers. After reviewing the management options for treatment, he elected to proceed with the above surgical procedure(s). We have discussed the potential benefits and risks of the procedure, side effects of the proposed treatment, the likelihood of the patient achieving the goals of the procedure, and any potential problems that might occur during the procedure or recuperation. Informed consent has been obtained.  Description of procedure:  The patient was taken to the operating room and general anesthesia was induced.  The patient was placed in the dorsal lithotomy position, prepped and draped in the usual sterile fashion, and preoperative antibiotics were administered. A preoperative time-out was performed.   Cystourethroscopy was performed.  The patient's urethra was examined and was normal.. The bladder was then systematically examined in its entirety. There was no evidence for any bladder tumors, stones, or other mucosal pathology.  The stent was grasped and brought to the urethral meatus.  I then wired the stent and removed it over the wire.  I then exchanged the wire for an open ended catheter.  Omnipaque contrast was injected through the ureteral catheter and a retrograde pyelogram was performed with findings as dictated above.  A 0.38 sensor guidewire was then advanced up the left ureter into the renal pelvis under fluoroscopic guidance.  The wire was then backloaded through  the cystoscope and a ureteral stent was advance over the wire using Seldinger technique.  The stent was positioned appropriately under fluoroscopic and cystoscopic guidance.  The wire was then removed with an adequate stent curl noted in the renal pelvis as well as in the bladder.  The bladder was then emptied and the procedure ended.  The patient appeared to tolerate the procedure well and without complications.  The patient was able to be awakened and transferred to the recovery unit in satisfactory condition.    Crist Fat, M.D.

## 2022-10-31 NOTE — Interval H&P Note (Signed)
History and Physical Interval Note:  10/31/2022 12:08 PM  Jill Patel  has presented today for surgery, with the diagnosis of PYELONEPHRITIS.  The various methods of treatment have been discussed with the patient and family. After consideration of risks, benefits and other options for treatment, the patient has consented to  Procedure(s): CYSTOSCOPY WITH LEFT STENT EXCHANGE (Left) as a surgical intervention.  The patient's history has been reviewed, patient examined, no change in status, stable for surgery.  I have reviewed the patient's chart and labs.  Questions were answered to the patient's satisfaction.     Crist Fat

## 2022-11-01 ENCOUNTER — Inpatient Hospital Stay (HOSPITAL_COMMUNITY): Payer: Medicaid Other

## 2022-11-01 ENCOUNTER — Encounter (HOSPITAL_COMMUNITY): Payer: Self-pay | Admitting: Urology

## 2022-11-01 ENCOUNTER — Other Ambulatory Visit (HOSPITAL_COMMUNITY): Payer: Medicaid Other

## 2022-11-01 DIAGNOSIS — N390 Urinary tract infection, site not specified: Secondary | ICD-10-CM | POA: Diagnosis not present

## 2022-11-01 DIAGNOSIS — A419 Sepsis, unspecified organism: Secondary | ICD-10-CM | POA: Diagnosis not present

## 2022-11-01 DIAGNOSIS — N12 Tubulo-interstitial nephritis, not specified as acute or chronic: Secondary | ICD-10-CM | POA: Diagnosis not present

## 2022-11-01 DIAGNOSIS — R911 Solitary pulmonary nodule: Secondary | ICD-10-CM | POA: Diagnosis not present

## 2022-11-01 LAB — CULTURE, BLOOD (ROUTINE X 2): Special Requests: ADEQUATE

## 2022-11-01 LAB — CBC
HCT: 32.3 % — ABNORMAL LOW (ref 36.0–46.0)
Hemoglobin: 10.3 g/dL — ABNORMAL LOW (ref 12.0–15.0)
MCH: 29.9 pg (ref 26.0–34.0)
MCHC: 31.9 g/dL (ref 30.0–36.0)
MCV: 93.9 fL (ref 80.0–100.0)
Platelets: 200 10*3/uL (ref 150–400)
RBC: 3.44 MIL/uL — ABNORMAL LOW (ref 3.87–5.11)
RDW: 12.6 % (ref 11.5–15.5)
WBC: 19.9 10*3/uL — ABNORMAL HIGH (ref 4.0–10.5)
nRBC: 0 % (ref 0.0–0.2)

## 2022-11-01 LAB — BASIC METABOLIC PANEL
Anion gap: 9 (ref 5–15)
BUN: 11 mg/dL (ref 6–20)
CO2: 22 mmol/L (ref 22–32)
Calcium: 8.7 mg/dL — ABNORMAL LOW (ref 8.9–10.3)
Chloride: 108 mmol/L (ref 98–111)
Creatinine, Ser: 0.79 mg/dL (ref 0.44–1.00)
GFR, Estimated: >60 mL/min (ref >60)
Glucose, Bld: 159 mg/dL — ABNORMAL HIGH (ref 70–99)
Potassium: 3.9 mmol/L (ref 3.5–5.1)
Sodium: 139 mmol/L (ref 135–145)

## 2022-11-01 LAB — URINE CULTURE

## 2022-11-01 MED ORDER — ORAL CARE MOUTH RINSE: 15.0000 mL | OROMUCOSAL | Status: DC | PRN

## 2022-11-01 MED ORDER — FLUTICASONE PROPIONATE 50 MCG/ACT NA SUSP
2.0000 | Freq: Every day | NASAL | Status: DC
  Administered 2022-11-01 – 2022-11-02 (×2): 2 via NASAL
  Filled 2022-11-01: qty 16

## 2022-11-01 MED ORDER — IOHEXOL 350 MG/ML SOLN
75.0000 mL | Freq: Once | INTRAVENOUS | Status: AC | PRN
  Administered 2022-11-01: 75 mL via INTRAVENOUS

## 2022-11-01 NOTE — Progress Notes (Signed)
Urology Progress Note   1 Day Post-Op s/p cystourethroscopy, Left ureteral stent exchange  Subjective: No acute events overnight. Afebrile and stable. Feeling better this morning but still with residual left flank pain. Reports some mild hematuria. WBC 19.7 (16.7), Cr 0.79. Continued on CTX/Fluc. Urine culture and blood cultures pending.  Objective: Vital signs in last 24 hours: Temp:  [96.3 F (35.7 C)-99.5 F (37.5 C)] 97.9 F (36.6 C) (04/12 0629) Pulse Rate:  [68-102] 82 (04/12 0629) Resp:  [12-20] 16 (04/12 0629) BP: (82-116)/(41-77) 113/77 (04/12 0629) SpO2:  [94 %-100 %] 99 % (04/12 0629) Weight:  [55.5 kg] 55.5 kg (04/11 1400)  Intake/Output from previous day: 04/11 0701 - 04/12 0700 In: 2101.4 [P.O.:990; I.V.:1111.4] Out: 1100 [Urine:1100] Intake/Output this shift: No intake/output data recorded.  Physical Exam:  General: Alert and oriented CV: Regular rate Lungs: Normal work of breathing on room air Abdomen: Soft, nondistended GU: Voiding spontaneously Ext: NT, No erythema  Lab Results: Recent Labs    10/30/22 2300 10/31/22 0633 11/01/22 0438  HGB 11.8* 9.4* 10.3*  HCT 35.9* 28.3* 32.3*   BMET Recent Labs    10/31/22 1331 11/01/22 0438  NA 138 139  K 4.1 3.9  CL 110 108  CO2 22 22  GLUCOSE 106* 159*  BUN 7 11  CREATININE 0.66 0.79  CALCIUM 7.6* 8.7*     Studies/Results: DG C-Arm 1-60 Min-No Report  Result Date: 10/31/2022 Fluoroscopy was utilized by the requesting physician.  No radiographic interpretation.   CT ABDOMEN PELVIS W CONTRAST  Result Date: 10/31/2022 CLINICAL DATA:  Left flank pain and dysuria. Recent kidney infection. Status post cystography with left ureteral stent placement. EXAM: CT ABDOMEN AND PELVIS WITH CONTRAST TECHNIQUE: Multidetector CT imaging of the abdomen and pelvis was performed using the standard protocol following bolus administration of intravenous contrast. RADIATION DOSE REDUCTION: This exam was performed  according to the departmental dose-optimization program which includes automated exposure control, adjustment of the mA and/or kV according to patient size and/or use of iterative reconstruction technique. CONTRAST:  OMNIPAQUE IOHEXOL 300 MG/ML  SOLN COMPARISON:  10/09/2022. FINDINGS: Lower chest: No acute abnormality. Hepatobiliary: Scattered subcentimeter hypodensities are present in the liver, statistically most likely representing cysts or hemangiomas. No biliary ductal dilatation. The gallbladder is without stones. Pancreas: Unremarkable. No pancreatic ductal dilatation or surrounding inflammatory changes. Spleen: Normal in size without focal abnormality. Adrenals/Urinary Tract: The adrenal glands are within normal limits. There is patchy hypoenhancement in the kidneys bilaterally. Renal calculi are present bilaterally. Left ureteral stent is in place with no evidence of obstructive uropathy. There is a 5 mm calculus in the distal left ureter adjacent to the ureteral stent. The bladder is unremarkable. Stomach/Bowel: Stomach is within normal limits. Appendix appears normal. No evidence of bowel wall thickening, distention, or inflammatory changes. No free air or pneumatosis. Vascular/Lymphatic: No significant vascular findings are present. Prominent lymph nodes are noted in the retroperitoneum at the level of the left kidney and are likely reactive. Reproductive: Uterus and bilateral adnexa are unremarkable. Other: A trace amount of free fluid is noted inferior to the cecum in the right lower quadrant. Musculoskeletal: No acute osseous abnormality. IMPRESSION: 1. Patchy hypoenhancement in the kidneys bilaterally, concerning for pyelonephritis. 2. Nonobstructive renal calculi bilaterally. Left ureteral stent in place with a 5 mm calculus in the distal left ureter. Electronically Signed   By: Thornell Sartorius M.D.   On: 10/31/2022 04:15    Assessment/Plan: Urosepsis with candiduria Obstructing 63mm L UVJ  stone s/p left ureteral stent on 10/01/2022 and stent exchange on 10/31/2022  - Continue empiric antibiotics with antifungal coverage. Follow up urine culture and tailor appropriately. She will need at least 14 days of culture specific antibiotics. - She has left USE scheduled 11/07/2022 with Dr. Marlou Porch.   Please pager urology with any questions or concerns.    LOS: 1 day   Morry Veiga Brendan Gruwell 11/01/2022, 7:04 AM

## 2022-11-01 NOTE — Anesthesia Postprocedure Evaluation (Signed)
Anesthesia Post Note  Patient: Jill Patel  Procedure(s) Performed: CYSTOSCOPY WITH LEFT STENT EXCHANGE (Left)     Patient location during evaluation: PACU Anesthesia Type: General Level of consciousness: awake and alert Pain management: pain level controlled Vital Signs Assessment: post-procedure vital signs reviewed and stable Respiratory status: spontaneous breathing, nonlabored ventilation, respiratory function stable and patient connected to nasal cannula oxygen Cardiovascular status: blood pressure returned to baseline and stable Postop Assessment: no apparent nausea or vomiting Anesthetic complications: no   No notable events documented.  Last Vitals:  Vitals:   11/01/22 0155 11/01/22 0629  BP: (!) 104/55 113/77  Pulse: 80 82  Resp: 16 16  Temp: 36.8 C 36.6 C  SpO2: 94% 99%    Last Pain:  Vitals:   11/01/22 0629  TempSrc: Oral  PainSc:                  Collene Schlichter

## 2022-11-01 NOTE — Progress Notes (Signed)
[facility] Progress Note  Patient: Jill Patel     XBM:[id number]  DOA: 10/30/2022   PCP: Pcp, No       Brief hospital course: 55 y y/o female with h/o infected obstruction 5 mm UVJ stone and left ureteral stent placement 10/01/22 by Dr Marlou Porch presented to [facility] for nausea left flank pain starting at 5 PM on the day of presentation.   In the ED:  T max 102, HR 137, BP as low as 95/48 and 84/41, WBC 16.6 UA > large leukocytes, rare bacteria, 21-50 RBC, + nitrite, yeast, mucus CT abd/pelv with contrast: 1. Patchy hypoenhancement in the kidneys bilaterally, concerning for pyelonephritis. 2. Nonobstructive renal calculi bilaterally. Left ureteral stent in place with a 5 mm calculus in the distal left ureter.  Given Ceftriaxone in the ED.  Previously has grown our Staph saprophyticus (on multiple occasions in 2023) Per Urology, she requires a ureteral exchange and 14 days of an antibiotic.   Subjective:  Continues to have left flank pain but not as severe as yesterday. Now having left sided chest pain and left upper back pain when she takes a deep breath. No cough today. Has a stuffy and runny nose for one week now. Oral intake better. Urinating well.   Assessment and Plan: Principal Problem:   Sepsis secondary to UTI Pyelonephritis  - urine culture pending- she is receiving Fluconazole and Ceftriaxone - WBC 19.9 today- follow on current antibiotics  Active Problems: Hypokalemia/hypomagnesemia - resolved after treatment  Nausea/ h/o prolonged QT - QTc is normal - nausea improved  Chest pain- left sided - no tenderness on exam but pain present with each breath - no swelling in legs- no travel - not on OCP- does not smoke and has not been sedentary - D dimer will not be accurate in setting of current infection - check CT chest for PE  Stuffy and runny nose- allergies - Flonase  Alpha thalassemia carrier/ normocytic anemia    Code Status: Full  Code Consultants: urology Level of Care: Level of care: Progressive Total time on patient care: 30 min DVT prophylaxis:  SCDs Start: 10/31/22 0548     Objective:   Vitals:   10/31/22 2204 11/01/22 0155 11/01/22 0629 11/01/22 1409  BP: 116/77 (!) 104/55 113/77 120/82  Pulse: 78 80 82 86  Resp: 16 16 16 18   Temp: 97.8 F (36.6 C) 98.2 F (36.8 C) 97.9 F (36.6 C)   TempSrc: Oral Oral Oral   SpO2: 99% 94% 99% 100%  Weight:      Height:       Filed Weights   10/30/22 2300 10/31/22 1400  Weight: 50.8 kg 55.5 kg   Exam: General exam: Appears comfortable  HEENT: oral mucosa moist Respiratory system: Clear to auscultation.  Cardiovascular system: S1 & S2 heard  Gastrointestinal system: Abdomen soft, non-tender, nondistended. Normal bowel sounds   Back: tender in left flank Extremities: No cyanosis, clubbing or edema Psychiatry:  Mood & affect appropriate.       CBC: Recent Labs  Lab 10/30/22 2300 10/31/22 0633 11/01/22 0438  WBC 16.6* 12.5* 19.9*  NEUTROABS 14.3* 11.3*  --   HGB 11.8* 9.4* 10.3*  HCT 35.9* 28.3* 32.3*  MCV 89.3 91.3 93.9  PLT 237 172 200    Basic Metabolic Panel: Recent Labs  Lab 10/30/22 2300 10/31/22 0633 10/31/22 1331 11/01/22 0438  NA 136 133* 138 139  K 3.3* 3.0* 4.1 3.9  CL 105 106 110 108  CO2 24  21* 22 22  GLUCOSE 124* 141* 106* 159*  BUN CREATININE 0.77 0.68 0.66 0.79  CALCIUM 8.8* 7.2* 7.6* 8.7*  MG  --  1.6* 3.1*  --   PHOS  --  2.6  --   --     GFR: Estimated Creatinine Clearance: 89.7 mL/min (by C-G formula based on SCr of 0.79 mg/dL).  Scheduled Meds:  senna-docusate  1 tablet Oral QHS   Continuous Infusions:  sodium chloride 75 mL/hr at 11/01/22 0535   cefTRIAXone (ROCEPHIN)  IV 2 g (11/01/22 0354)   fluconazole (DIFLUCAN) IV 200 mg (11/01/22 1002)   Imaging and lab data was personally reviewed DG C-Arm 1-60 Min-No Report  Result Date: 10/31/2022 Fluoroscopy was utilized by the requesting  physician.  No radiographic interpretation.   CT ABDOMEN PELVIS W CONTRAST  Result Date: 10/31/2022 CLINICAL DATA:  Left flank pain and dysuria. Recent kidney infection. Status post cystography with left ureteral stent placement. EXAM: CT ABDOMEN AND PELVIS WITH CONTRAST TECHNIQUE: Multidetector CT imaging of the abdomen and pelvis was performed using the standard protocol following bolus administration of intravenous contrast. RADIATION DOSE REDUCTION: This exam was performed according to the departmental dose-optimization program which includes automated exposure control, adjustment of the mA and/or kV according to patient size and/or use of iterative reconstruction technique. CONTRAST:  OMNIPAQUE IOHEXOL 300 MG/ML  SOLN COMPARISON:  10/09/2022. FINDINGS: Lower chest: No acute abnormality. Hepatobiliary: Scattered subcentimeter hypodensities are present in the liver, statistically most likely representing cysts or hemangiomas. No biliary ductal dilatation. The gallbladder is without stones. Pancreas: Unremarkable. No pancreatic ductal dilatation or surrounding inflammatory changes. Spleen: Normal in size without focal abnormality. Adrenals/Urinary Tract: The adrenal glands are within normal limits. There is patchy hypoenhancement in the kidneys bilaterally. Renal calculi are present bilaterally. Left ureteral stent is in place with no evidence of obstructive uropathy. There is a 5 mm calculus in the distal left ureter adjacent to the ureteral stent. The bladder is unremarkable. Stomach/Bowel: Stomach is within normal limits. Appendix appears normal. No evidence of bowel wall thickening, distention, or inflammatory changes. No free air or pneumatosis. Vascular/Lymphatic: No significant vascular findings are present. Prominent lymph nodes are noted in the retroperitoneum at the level of the left kidney and are likely reactive. Reproductive: Uterus and bilateral adnexa are unremarkable. Other: A trace  amount of free fluid is noted inferior to the cecum in the right lower quadrant. Musculoskeletal: No acute osseous abnormality. IMPRESSION: 1. Patchy hypoenhancement in the kidneys bilaterally, concerning for pyelonephritis. 2. Nonobstructive renal calculi bilaterally. Left ureteral stent in place with a 5 mm calculus in the distal left ureter. Electronically Signed   By: Thornell Sartorius M.D.   On: 10/31/2022 04:15    LOS: 1 day   Author: Calvert Cantor  11/01/2022 3:43 PM  To contact [facility]>   Check the care team in [facility] and look for the attending/consulting [facility] provider listed  Log into [email or web address] and use [place]'s universal password   Go to> "[facility]"  and find provider  If you still have difficulty reaching the provider, please page the [facility] (Director on Call) for the Hospitalists listed on amion

## 2022-11-02 ENCOUNTER — Other Ambulatory Visit (HOSPITAL_COMMUNITY): Payer: Self-pay

## 2022-11-02 DIAGNOSIS — A419 Sepsis, unspecified organism: Secondary | ICD-10-CM | POA: Diagnosis not present

## 2022-11-02 DIAGNOSIS — N390 Urinary tract infection, site not specified: Secondary | ICD-10-CM | POA: Diagnosis not present

## 2022-11-02 DIAGNOSIS — N12 Tubulo-interstitial nephritis, not specified as acute or chronic: Secondary | ICD-10-CM | POA: Diagnosis not present

## 2022-11-02 LAB — CBC
HCT: 33.2 % — ABNORMAL LOW (ref 36.0–46.0)
Hemoglobin: 10.8 g/dL — ABNORMAL LOW (ref 12.0–15.0)
MCH: 30 pg (ref 26.0–34.0)
MCHC: 32.5 g/dL (ref 30.0–36.0)
MCV: 92.2 fL (ref 80.0–100.0)
Platelets: 227 10*3/uL (ref 150–400)
RBC: 3.6 MIL/uL — ABNORMAL LOW (ref 3.87–5.11)
RDW: 12.5 % (ref 11.5–15.5)
WBC: 12.8 10*3/uL — ABNORMAL HIGH (ref 4.0–10.5)
nRBC: 0 % (ref 0.0–0.2)

## 2022-11-02 LAB — BASIC METABOLIC PANEL
Anion gap: 11 (ref 5–15)
BUN: 6 mg/dL (ref 6–20)
CO2: 25 mmol/L (ref 22–32)
Calcium: 8.8 mg/dL — ABNORMAL LOW (ref 8.9–10.3)
Chloride: 105 mmol/L (ref 98–111)
Creatinine, Ser: 0.74 mg/dL (ref 0.44–1.00)
GFR, Estimated: >60 mL/min (ref >60)
Glucose, Bld: 89 mg/dL (ref 70–99)
Potassium: 3.5 mmol/L (ref 3.5–5.1)
Sodium: 141 mmol/L (ref 135–145)

## 2022-11-02 LAB — CULTURE, BLOOD (ROUTINE X 2): Culture: NO GROWTH

## 2022-11-02 LAB — URINE CULTURE: Culture: >=100000 — AB

## 2022-11-02 LAB — MAGNESIUM: Magnesium: 2 mg/dL (ref 1.7–2.4)

## 2022-11-02 MED ORDER — FLUCONAZOLE 200 MG PO TABS
400.0000 mg | ORAL_TABLET | Freq: Every day | ORAL | 0 refills | Status: DC
  Filled 2022-11-02: qty 10, 5d supply, fill #0

## 2022-11-02 MED ORDER — CEPHALEXIN 500 MG PO CAPS
500.0000 mg | ORAL_CAPSULE | Freq: Four times a day (QID) | ORAL | 0 refills | Status: AC
  Filled 2022-11-02: qty 40, 10d supply, fill #0

## 2022-11-02 MED ORDER — FLUTICASONE PROPIONATE 50 MCG/ACT NA SUSP: 2.0000 | Freq: Every day | NASAL | 1 refills | Status: DC

## 2022-11-02 MED ORDER — POTASSIUM CHLORIDE CRYS ER 20 MEQ PO TBCR
40.0000 meq | EXTENDED_RELEASE_TABLET | Freq: Once | ORAL | Status: AC
  Administered 2022-11-02: 40 meq via ORAL
  Filled 2022-11-02: qty 2

## 2022-11-02 NOTE — Plan of Care (Signed)
  Problem: Education: Goal: Knowledge of General Education information will improve Description Including pain rating scale, medication(s)/side effects and non-pharmacologic comfort measures Outcome: Progressing   Problem: Health Behavior/Discharge Planning: Goal: Ability to manage health-related needs will improve Outcome: Progressing   

## 2022-11-02 NOTE — Discharge Instructions (Signed)
You have 2 lung nodules and you will need another CT scan in 6-12 months to see if these are growing. Please do not smoke or use nicotine in any form.  Please review all of you discharge paperwork on the day of discharge and be sure you have all of your prescribed medications.  Please request your Primary MD to go over all Hospital Tests and Procedure/Radiological results at the follow up Please get all Hospital records sent to your primary MD by signing hospital release before you go home.   In some cases, there will be blood work, cultures and biopsy results pending at the time of your discharge. Please request that your primary care M.D. goes through all the records of your hospital data and follows up on these results.  Please take all your medications with you for your next visit with your Primary MD   Please request your Primary MD to go over all hospital tests and procedure/radiological results at the follow up, please ask your Primary MD to get all Hospital records sent to his/her office.   You must read complete instructions/literature along with all the possible adverse reactions/side effects for all the Medicines you take and that have been prescribed to you. Take any new Medicines after you have completely understood and accpet all the possible adverse reactions/side effects.    Do not drive or operate heavy machinery when taking Pain medications.    Do not take more than prescribed Pain, Sleep and Anxiety Medications  If you have smoked or chewed Tobacco  in the last 2 yrs please stop smoking, stop any regular Alcohol  and or any Recreational drug use.   Wear Seat belts while driving.   If you had Pneumonia or Lung problems at the Hospital: Please get a 2 view Chest X ray done in 6-8 weeks after hospital discharge or sooner if instructed by your Primary MD.   If you have Congestive Heart Failure: Please call your Cardiologist or Primary MD anytime you have any of the following  symptoms:  1) 3 pound weight gain in 24 hours or 5 pounds in 1 week  2) shortness of breath, with or without a dry hacking cough  3) swelling in the hands, feet or stomach  4) if you have to sleep on extra pillows at night in order to breathe 5) Follow cardiac low salt diet and 1.5 lit/day fluid restriction.   If you have Diabetes Accuchecks 4 times/day- once on AM empty stomach and then before each meal. Log in all results and show them to your primary doctor at your next visit. If any glucose reading is under 60 or above 400 call your primary MD immediately.   If you have Seizure/Convulsions/Epilepsy: Please do not drive, operate heavy machinery, participate in activities at heights or participate in high speed sports until you have seen by Primary MD or a Neurologist and advised to do so again. Per [facility] statutes, patients with seizures are not allowed to drive until they have been seizure-free for six months.  Use caution when using heavy equipment or power tools. Avoid working on ladders or at heights. Take showers instead of baths. Ensure the water temperature is not too high on the home water heater. Do not go swimming alone. Do not lock yourself in a room alone (i.e. bathroom). When caring for infants or small children, sit down when holding, feeding, or changing them to minimize risk of injury to the child in the event  you have a seizure. Maintain good sleep hygiene. Avoid alcohol.    If you had Gastrointestinal Bleeding: Please ask your Primary MD to check a complete blood count within one week of discharge or at your next visit. Your endoscopic/colonoscopic biopsies that are pending at the time of discharge, will also need to followed by your Primary MD.  Please note You were cared for by a hospitalist during your hospital stay. If you have any questions about your discharge medications or the care you received while you were in the hospital after you are discharged, you  can call the unit and asked to speak with the hospitalist on call if the hospitalist that took care of you is not available. Once you are discharged, your primary care physician will handle any further medical issues. Please note that NO REFILLS for any discharge medications will be authorized once you are discharged, as it is imperative that you return to your primary care physician (or establish a relationship with a primary care physician if you do not have one) for your aftercare needs so that they can reassess your need for medications and monitor your lab values.   You can reach the hospitalist office at phone [phone] or fax [phone]   If you do not have a primary care physician, you can call [phone] for a physician referral.

## 2022-11-02 NOTE — Discharge Summary (Signed)
Physician Discharge Summary  Jill Patel ZOX:[id number] DOB: [birth year] DOA: 10/30/2022  PCP: Pcp, No  Admit date: 10/30/2022 Discharge date: 11/02/2022 Discharging to: home Recommendations for Outpatient Follow-up:  She will need a repeat CT scan in 6-12 months to f/u on pulmonary nodules  Consults:  Urology Procedures:  4/11> left retrograde pyelography demonstrated- no specific filling defect.  24 cm stent was exchanged in the left ureter.    Hospital Course:  32 y y/o female with h/o infected obstruction 5 mm UVJ stone and left ureteral stent placement 10/01/22 by Dr Marlou Porch presented to [facility] for nausea left flank pain starting at 5 PM on the day of presentation.    In the ED:  T max 102, HR 137, BP as low as 95/48 and 84/41, WBC 16.6 UA > large leukocytes, rare bacteria, 21-50 RBC, + nitrite, yeast, mucus CT abd/pelv with contrast: 1. Patchy hypoenhancement in the kidneys bilaterally, concerning for pyelonephritis. 2. Nonobstructive renal calculi bilaterally. Left ureteral stent in place with a 5 mm calculus in the distal left ureter.   Given Ceftriaxone in the ED.  Previously has grown our Staph saprophyticus (on multiple occasions in 2023) Per Urology, she requires a ureteral exchange and 14 days of an antibiotic.   Principal Problem:   Sepsis secondary to UTI and Pyelonephritis - stent exchanged as mentioned - UA showed rare bacteria and budding yeast  - has been receiving Fluconazole and Ceftriaxone - WBC 19.9> 12.8- - she is symptomatically improving - U culture > e coli, pan sensitive- no growth reported on the yeast although I called the lab to have it speciated - cont on Keflex and Fluconazole x 10 more days- d/w ID in detail and with patient   Active Problems: Hypokalemia/hypomagnesemia - resolved after treatment   Nausea- h/o prolonged QT - QTc is normal - nausea improved   Chest pain- left sided- pulmonary nodules - no tenderness on exam but pain  present with each breath - no swelling in legs- no travel - not on OCP- does not smoke and has not been sedentary - D dimer not checked as it will not be accurate in setting of current infection - CT chest does not show a PE but does show b/l pulmonary nodule that will need to be followed with a repeat scan in 6-12 months- I did discuss this with the patient- she was a smoker but has stopped - of note, pain is improving   Stuffy and runny nose- allergies - Flonase   Alpha thalassemia carrier/ normocytic anemia           Discharge Instructions  Discharge Instructions     Increase activity slowly   Complete by: As directed       Allergies as of 11/02/2022       Reactions   Kiwi Extract Anaphylaxis   Aleve [naproxen Sodium] Other (See Comments)   Stomach bleeds. Not allergic per Pt    Mango Flavor Other (See Comments)   Scratchy throat   Other Other (See Comments)   Eggplant - scratchy throat    Pineapple Other (See Comments)   Scratchy throat         Medication List     TAKE these medications    cephALEXin 500 MG capsule Commonly known as: KEFLEX Take 1 capsule (500 mg total) by mouth 4 (four) times daily for 10 days.   fluconazole 200 MG tablet Commonly known as: DIFLUCAN Take 2 tablets (400 mg total) by mouth daily.  fluticasone 50 MCG/ACT nasal spray Commonly known as: FLONASE Place 2 sprays into both nostrils daily. Start taking on: November 03, 2022            The results of significant diagnostics from this hospitalization (including imaging, microbiology, ancillary and laboratory) are listed below for reference.    CT Angio Chest Pulmonary Embolism (PE) W or WO Contrast  Result Date: 11/01/2022 CLINICAL DATA:  Pulmonary embolism suspected, high probability. Sepsis. EXAM: CT ANGIOGRAPHY CHEST WITH CONTRAST TECHNIQUE: Multidetector CT imaging of the chest was performed using the standard protocol during bolus administration of intravenous  contrast. Multiplanar CT image reconstructions and MIPs were obtained to evaluate the vascular anatomy. RADIATION DOSE REDUCTION: This exam was performed according to the departmental dose-optimization program which includes automated exposure control, adjustment of the mA and/or kV according to patient size and/or use of iterative reconstruction technique. CONTRAST:  75mL OMNIPAQUE IOHEXOL 350 MG/ML SOLN COMPARISON:  None Available. FINDINGS: Cardiovascular: The heart is normal in size and there is no pericardial effusion. The aorta and pulmonary trunk are normal in caliber. No evidence of pulmonary embolism. Mediastinum/Nodes: No enlarged mediastinal, hilar, or axillary lymph nodes. Thyroid gland, trachea, and esophagus demonstrate no significant findings. Lungs/Pleura: Lungs are clear. No pleural effusion or pneumothorax. A 6 mm subpleural nodule is noted in the right lower lobe, axial image 64. 3 mm nodule is present in the left lower lobe, axial image 69. Upper Abdomen: No acute abnormality. Musculoskeletal: No acute osseous abnormality Review of the MIP images confirms the above findings. IMPRESSION: 1. No evidence of pulmonary embolism or other acute process. 2. Bilateral pulmonary nodules measuring up to 6 mm. Non-contrast chest CT at 6-12 months is recommended. If the nodule is stable at time of repeat CT, then future CT at 18-24 months (from today's scan) is considered optional for low-risk patients, but is recommended for high-risk patients. This recommendation follows the consensus statement: Guidelines for Management of Incidental Pulmonary Nodules Detected on CT Images: From the Fleischner Society 2017; Radiology 2017; 284:228-243. Electronically Signed   By: Thornell Sartorius M.D.   On: 11/01/2022 22:49   DG C-Arm 1-60 Min-No Report  Result Date: 10/31/2022 Fluoroscopy was utilized by the requesting physician.  No radiographic interpretation.   CT ABDOMEN PELVIS W CONTRAST  Result Date:  10/31/2022 CLINICAL DATA:  Left flank pain and dysuria. Recent kidney infection. Status post cystography with left ureteral stent placement. EXAM: CT ABDOMEN AND PELVIS WITH CONTRAST TECHNIQUE: Multidetector CT imaging of the abdomen and pelvis was performed using the standard protocol following bolus administration of intravenous contrast. RADIATION DOSE REDUCTION: This exam was performed according to the departmental dose-optimization program which includes automated exposure control, adjustment of the mA and/or kV according to patient size and/or use of iterative reconstruction technique. CONTRAST:  OMNIPAQUE IOHEXOL 300 MG/ML  SOLN COMPARISON:  10/09/2022. FINDINGS: Lower chest: No acute abnormality. Hepatobiliary: Scattered subcentimeter hypodensities are present in the liver, statistically most likely representing cysts or hemangiomas. No biliary ductal dilatation. The gallbladder is without stones. Pancreas: Unremarkable. No pancreatic ductal dilatation or surrounding inflammatory changes. Spleen: Normal in size without focal abnormality. Adrenals/Urinary Tract: The adrenal glands are within normal limits. There is patchy hypoenhancement in the kidneys bilaterally. Renal calculi are present bilaterally. Left ureteral stent is in place with no evidence of obstructive uropathy. There is a 5 mm calculus in the distal left ureter adjacent to the ureteral stent. The bladder is unremarkable. Stomach/Bowel: Stomach is within normal limits. Appendix appears  normal. No evidence of bowel wall thickening, distention, or inflammatory changes. No free air or pneumatosis. Vascular/Lymphatic: No significant vascular findings are present. Prominent lymph nodes are noted in the retroperitoneum at the level of the left kidney and are likely reactive. Reproductive: Uterus and bilateral adnexa are unremarkable. Other: A trace amount of free fluid is noted inferior to the cecum in the right lower quadrant. Musculoskeletal:  No acute osseous abnormality. IMPRESSION: 1. Patchy hypoenhancement in the kidneys bilaterally, concerning for pyelonephritis. 2. Nonobstructive renal calculi bilaterally. Left ureteral stent in place with a 5 mm calculus in the distal left ureter. Electronically Signed   By: Thornell Sartorius M.D.   On: 10/31/2022 04:15   Labs:   Basic Metabolic Panel: Recent Labs  Lab 10/30/22 2300 10/31/22 0633 10/31/22 1331 11/01/22 0438 11/02/22 0412 11/02/22 0926  NA 136 133* 138 139 141  --   K 3.3* 3.0* 4.1 3.9 3.5  --   CL 105 106 110 108 105  --   CO2 24 21* 22 22 25   --   GLUCOSE 124* 141* 106* 159* 89  --   BUN 14 10 7 11 6   --   CREATININE 0.77 0.68 0.66 0.79 0.74  --   CALCIUM 8.8* 7.2* 7.6* 8.7* 8.8*  --   MG  --  1.6* 3.1*  --   --  2.0  PHOS  --  2.6  --   --   --   --      CBC: Recent Labs  Lab 10/30/22 2300 10/31/22 0633 11/01/22 0438 11/02/22 0412  WBC 16.6* 12.5* 19.9* 12.8*  NEUTROABS 14.3* 11.3*  --   --   HGB 11.8* 9.4* 10.3* 10.8*  HCT 35.9* 28.3* 32.3* 33.2*  MCV 89.3 91.3 93.9 92.2  PLT 237 172 200 227         SIGNED:   Calvert Cantor, MD  [facility] 11/02/2022, 1:13 PM

## 2022-11-04 LAB — CULTURE, BLOOD (ROUTINE X 2)

## 2022-11-05 LAB — CULTURE, BLOOD (ROUTINE X 2): Culture: NO GROWTH

## 2022-11-06 NOTE — Progress Notes (Signed)
Made aware of time change to arrive at 0730 and only clear liquids up until 0630. Voiced understanding.

## 2022-11-07 ENCOUNTER — Ambulatory Visit (HOSPITAL_BASED_OUTPATIENT_CLINIC_OR_DEPARTMENT_OTHER): Payer: Medicaid Other | Admitting: Anesthesiology

## 2022-11-07 ENCOUNTER — Encounter (HOSPITAL_BASED_OUTPATIENT_CLINIC_OR_DEPARTMENT_OTHER)
Admission: RE | Disposition: A | Discharge status: Home / Self Care | Payer: Self-pay | Source: Home / Self Care | Attending: Urology

## 2022-11-07 ENCOUNTER — Encounter (HOSPITAL_BASED_OUTPATIENT_CLINIC_OR_DEPARTMENT_OTHER): Payer: Self-pay | Admitting: Urology

## 2022-11-07 ENCOUNTER — Other Ambulatory Visit: Payer: Self-pay

## 2022-11-07 ENCOUNTER — Ambulatory Visit (HOSPITAL_BASED_OUTPATIENT_CLINIC_OR_DEPARTMENT_OTHER)
Admission: RE | Admit: 2022-11-07 | Discharge: 2022-11-07 | Disposition: A | Discharge status: Home / Self Care | Payer: Medicaid Other | Attending: Urology | Admitting: Urology

## 2022-11-07 DIAGNOSIS — D649 Anemia, unspecified: Secondary | ICD-10-CM

## 2022-11-07 DIAGNOSIS — J45909 Unspecified asthma, uncomplicated: Secondary | ICD-10-CM

## 2022-11-07 DIAGNOSIS — Z87891 Personal history of nicotine dependence: Secondary | ICD-10-CM | POA: Diagnosis not present

## 2022-11-07 DIAGNOSIS — N200 Calculus of kidney: Secondary | ICD-10-CM | POA: Insufficient documentation

## 2022-11-07 DIAGNOSIS — Z01818 Encounter for other preprocedural examination: Secondary | ICD-10-CM

## 2022-11-07 HISTORY — DX: Personal history of urinary calculi: Z87.442

## 2022-11-07 HISTORY — PX: CYSTOSCOPY/URETEROSCOPY/HOLMIUM LASER/STENT PLACEMENT: SHX6546

## 2022-11-07 LAB — POCT PREGNANCY, URINE: Preg Test, Ur: NEGATIVE

## 2022-11-07 SURGERY — CYSTOSCOPY/URETEROSCOPY/HOLMIUM LASER/STENT PLACEMENT
Anesthesia: General | Site: Urethra | Laterality: Left

## 2022-11-07 MED ORDER — LIDOCAINE HCL (CARDIAC) PF 100 MG/5ML IV SOSY
PREFILLED_SYRINGE | INTRAVENOUS | Status: DC | PRN
  Administered 2022-11-07: 40 mg via INTRAVENOUS

## 2022-11-07 MED ORDER — OXYCODONE HCL 5 MG PO TABS: 5.0000 mg | ORAL_TABLET | Freq: Once | ORAL | Status: DC | PRN

## 2022-11-07 MED ORDER — SODIUM CHLORIDE 0.9 % IR SOLN
Status: DC | PRN
  Administered 2022-11-07 (×2): 3000 mL

## 2022-11-07 MED ORDER — ONDANSETRON HCL 4 MG/2ML IJ SOLN
INTRAMUSCULAR | Status: DC | PRN
  Administered 2022-11-07: 4 mg via INTRAVENOUS

## 2022-11-07 MED ORDER — IOHEXOL 300 MG/ML  SOLN
INTRAMUSCULAR | Status: DC | PRN
  Administered 2022-11-07: 17 mL via URETHRAL

## 2022-11-07 MED ORDER — FENTANYL CITRATE (PF) 100 MCG/2ML IJ SOLN
INTRAMUSCULAR | Status: AC
  Filled 2022-11-07: qty 2

## 2022-11-07 MED ORDER — ACETAMINOPHEN 500 MG PO TABS
ORAL_TABLET | ORAL | Status: AC
  Filled 2022-11-07: qty 2

## 2022-11-07 MED ORDER — PROPOFOL 10 MG/ML IV BOLUS
INTRAVENOUS | Status: AC
  Filled 2022-11-07: qty 20

## 2022-11-07 MED ORDER — MIDAZOLAM HCL 2 MG/2ML IJ SOLN
INTRAMUSCULAR | Status: AC
  Filled 2022-11-07: qty 2

## 2022-11-07 MED ORDER — PROPOFOL 10 MG/ML IV BOLUS
INTRAVENOUS | Status: DC | PRN
  Administered 2022-11-07: 150 mg via INTRAVENOUS

## 2022-11-07 MED ORDER — CIPROFLOXACIN IN D5W 400 MG/200ML IV SOLN
INTRAVENOUS | Status: AC
  Filled 2022-11-07: qty 200

## 2022-11-07 MED ORDER — CIPROFLOXACIN IN D5W 400 MG/200ML IV SOLN
400.0000 mg | INTRAVENOUS | Status: AC
  Administered 2022-11-07: 400 mg via INTRAVENOUS

## 2022-11-07 MED ORDER — DEXAMETHASONE SODIUM PHOSPHATE 4 MG/ML IJ SOLN
INTRAMUSCULAR | Status: DC | PRN
  Administered 2022-11-07: 8 mg via INTRAVENOUS

## 2022-11-07 MED ORDER — LACTATED RINGERS IV SOLN: INTRAVENOUS | Status: DC

## 2022-11-07 MED ORDER — AMISULPRIDE (ANTIEMETIC) 5 MG/2ML IV SOLN: 10.0000 mg | Freq: Once | INTRAVENOUS | Status: DC | PRN

## 2022-11-07 MED ORDER — FENTANYL CITRATE (PF) 100 MCG/2ML IJ SOLN
INTRAMUSCULAR | Status: DC | PRN
  Administered 2022-11-07: 25 ug via INTRAVENOUS
  Administered 2022-11-07: 50 ug via INTRAVENOUS

## 2022-11-07 MED ORDER — TRAMADOL HCL 50 MG PO TABS: 50.0000 mg | ORAL_TABLET | Freq: Four times a day (QID) | ORAL | 0 refills | Status: DC | PRN

## 2022-11-07 MED ORDER — FENTANYL CITRATE (PF) 100 MCG/2ML IJ SOLN
25.0000 ug | INTRAMUSCULAR | Status: DC | PRN
  Administered 2022-11-07: 25 ug via INTRAVENOUS

## 2022-11-07 MED ORDER — TAMSULOSIN HCL 0.4 MG PO CAPS: 0.4000 mg | ORAL_CAPSULE | Freq: Every day | ORAL | 0 refills | Status: DC

## 2022-11-07 MED ORDER — MIDAZOLAM HCL 2 MG/2ML IJ SOLN
INTRAMUSCULAR | Status: DC | PRN
  Administered 2022-11-07: 2 mg via INTRAVENOUS

## 2022-11-07 MED ORDER — ACETAMINOPHEN 500 MG PO TABS
1000.0000 mg | ORAL_TABLET | Freq: Once | ORAL | Status: AC
  Administered 2022-11-07: 1000 mg via ORAL

## 2022-11-07 MED ORDER — OXYCODONE HCL 5 MG/5ML PO SOLN: 5.0000 mg | Freq: Once | ORAL | Status: DC | PRN

## 2022-11-07 SURGICAL SUPPLY — 25 items
BAG DRAIN URO-CYSTO SKYTR STRL (DRAIN) ×2 IMPLANT
BAG DRN UROCATH (DRAIN) ×1
BASKET LASER NITINOL 1.9FR (BASKET) IMPLANT
BASKET ZERO TIP NITINOL 2.4FR (BASKET) IMPLANT
BSKT STON RTRVL 120 1.9FR (BASKET)
BSKT STON RTRVL ZERO TP 2.4FR (BASKET) ×1
CATH URETERAL DUAL LUMEN 10F (MISCELLANEOUS) IMPLANT
CATH URETL OPEN 5X70 (CATHETERS) ×2 IMPLANT
CLOTH BEACON ORANGE TIMEOUT ST (SAFETY) ×2 IMPLANT
EXTRACTOR STONE 1.7FRX115CM (UROLOGICAL SUPPLIES) IMPLANT
GLOVE BIO SURGEON STRL SZ7.5 (GLOVE) ×2 IMPLANT
GOWN STRL REUS W/TWL XL LVL3 (GOWN DISPOSABLE) ×2 IMPLANT
GUIDEWIRE STR DUAL SENSOR (WIRE) ×2 IMPLANT
IV NS IRRIG 3000ML ARTHROMATIC (IV SOLUTION) ×4 IMPLANT
KIT TURNOVER CYSTO (KITS) ×2 IMPLANT
MANIFOLD NEPTUNE II (INSTRUMENTS) ×2 IMPLANT
NS IRRIG 500ML POUR BTL (IV SOLUTION) ×2 IMPLANT
PACK CYSTO (CUSTOM PROCEDURE TRAY) ×2 IMPLANT
SHEATH NAVIGATOR HD 11/13X36 (SHEATH) IMPLANT
SLEEVE SCD COMPRESS KNEE MED (STOCKING) ×2 IMPLANT
STENT URET 6FRX24 CONTOUR (STENTS) IMPLANT
TRACTIP FLEXIVA PULS ID 200XHI (Laser) IMPLANT
TRACTIP FLEXIVA PULSE ID 200 (Laser) ×1
TUBE CONNECTING 12X1/4 (SUCTIONS) IMPLANT
TUBING UROLOGY SET (TUBING) ×2 IMPLANT

## 2022-11-07 NOTE — Interval H&P Note (Signed)
History and Physical Interval Note:  11/07/2022 9:34 AM  Jill Patel  has presented today for surgery, with the diagnosis of LEFT URETERAL CALCULI.  The various methods of treatment have been discussed with the patient and family. After consideration of risks, benefits and other options for treatment, the patient has consented to  Procedure(s): CYSTOSCOPY, LEFT URETEROSCOPY, HOLMIUM LASER LITHOTRIPSY, STONE EXTRACTION, AND LEFT URETERAL STENT EXCHANGE (Left) as a surgical intervention.  The patient's history has been reviewed, patient examined, no change in status, stable for surgery.  I have reviewed the patient's chart and labs.  Questions were answered to the patient's satisfaction.     Crist Fat

## 2022-11-07 NOTE — Op Note (Signed)
Preoperative diagnosis:  Left lower pole stones  Postoperative diagnosis:  Same  Procedure: Cystoscopy, left retrograde pyelogram with interpretation Left ureteral stent exchange Left ureteroscopy, laser lithotripsy and stone removal  Surgeon: Crist Fat, MD  Anesthesia: General  Complications: None  Intraoperative findings:  #1: The patient's left-sided retrograde pyelogram demonstrated normal caliber ureter with no filling defects or other abnormalities.  There were 2 large filling defects in the lower pole consistent with the patient's known stones. #2: The patient is 24 cm time 6 French left ureteral stent was exchanged, stent tether was left #3: The patient's stones was located in the lower pole medial calyx, 2 of the stones were too large to move into the upper pole without laser fragmentation initially.  I was able to get all the stones ultimately fragmented and moved into the upper pole, where I dusted the stones into pieces that were easily passable.  EBL: Minimal  Specimens: 2 small pieces of stone were basketed and removed for stone specimen  Indication: Jill Patel is a 27 y.o. patient with urosepsis from it 10 mm stone with additional stones in the lower pole who underwent stenting several weeks prior.  She then had pain and fever and presumable yeast infection contributing to her symptoms and her stents were exchanged..  After reviewing the management options for treatment, he elected to proceed with the above surgical procedure(s). We have discussed the potential benefits and risks of the procedure, side effects of the proposed treatment, the likelihood of the patient achieving the goals of the procedure, and any potential problems that might occur during the procedure or recuperation. Informed consent has been obtained.  Description of procedure:  The patient was taken to the operating room and general anesthesia was induced.  The patient was placed in the  dorsal lithotomy position, prepped and draped in the usual sterile fashion, and preoperative antibiotics were administered. A preoperative time-out was performed.   21 French 30 degree cystoscope was gently passed to the patient's urethra and into the bladder under the guidance.  The stent was grasped from the left ureteral orifice and pulled to the urethral meatus.  A wire was then advanced through the stent and up into the left renal pelvis.  The bladder was subsequently emptied.  I advanced a dual-lumen catheter into the distal aspect of the ureter over the wire and performed retrograde pyelogram with the above findings.  I then advanced a second wire up through the dual-lumen into the renal pelvis and backed the dual-lumen catheter out over the wire.  Subsequently advanced a 11/13 French medium sized ureteral access sheath over the second wire and into the proximal ureter under fluoroscopic guidance.  I remove the inner portion of the sheath as well as the safety wire.  I then advanced the flexible ureteroscope over the wire and performed pyeloscopy.  I noted the stones all in the lower pole and the medial calyx.  These were all seemingly sharp angles.  I was able to get one of the stones basketed and pushed into the upper pole, but the other 2 required fragmentation prior to being able to pull them through the calyx.  Once the stones were small enough to remove them pushed into the upper pole and the lower pole was clear I lasered the stone fragments into small dust like fragments.  There were several large pieces that I was able to remove with the basket which I sent for stone analysis.  I then swept the  ureter noting no additional filling defects or stone fragments.  I simultaneously removed the ureteral access sheath while inspecting the ureter  noting no significant ureteral injury or abnormality.  I then advanced a dual-lumen catheter over the safety wire and performed a completion retrograde pyelogram  with no filling defects or other significant abnormalities.  I then advanced a 24 cm time 6 French double-J ureteral stent over the wire and into the renal pelvis advancing it to the urethral meatus slowly and then removing the wire keeping pressure gently on the stent so that it would remain in the patient's bladder.  Once the stent was deployed a nice curl was noted within the bladder as well as in the renal pelvis.  The stent tether was then curled into the patient's vagina.  The patient was subsequently extubated and returned to PACU in stable condition without any significant perioperative complication.  Disposition: The patient is being discharged home, and will be instructed to remove her stent on Monday, April 22.  She has a follow-up renal ultrasound in 6 weeks.  Crist Fat, M.D.

## 2022-11-07 NOTE — Transfer of Care (Signed)
Immediate Anesthesia Transfer of Care Note  Patient: Jill Patel  Procedure(s) Performed: CYSTOSCOPY, LEFT URETEROSCOPY, HOLMIUM LASER LITHOTRIPSY, STONE EXTRACTION, LEFT RETROGRADE PYLEOGRAM AND LEFT URETERAL STENT EXCHANGE (Left: Urethra)  Patient Location: PACU  Anesthesia Type:General  Level of Consciousness: awake and patient cooperative  Airway & Oxygen Therapy: Patient Spontanous Breathing and Patient connected to nasal cannula oxygen  Post-op Assessment: Report given to RN and Post -op Vital signs reviewed and stable  Post vital signs: Reviewed and stable  Last Vitals:  Vitals Value Taken Time  BP 134/98 11/07/22 1134  Temp    Pulse 98 11/07/22 1137  Resp 19 11/07/22 1137  SpO2 100 % 11/07/22 1137  Vitals shown include unvalidated device data.  Last Pain:  Vitals:   11/07/22 0757  TempSrc: Oral  PainSc: 0-No pain      Patients Stated Pain Goal: 5 (11/07/22 0757)  Complications: No notable events documented.

## 2022-11-07 NOTE — Anesthesia Preprocedure Evaluation (Signed)
Anesthesia Evaluation  Patient identified by MRN, date of birth, ID band Patient awake    Reviewed: Allergy & Precautions, H&P , NPO status , Patient's Chart, lab work & pertinent test results  Airway Mallampati: III  TM Distance: >3 FB Neck ROM: Full    Dental no notable dental hx. (+) Teeth Intact, Dental Advisory Given   Pulmonary asthma , former smoker   Pulmonary exam normal breath sounds clear to auscultation       Cardiovascular negative cardio ROS  Rhythm:Regular Rate:Normal     Neuro/Psych  Headaches  Anxiety Depression       GI/Hepatic negative GI ROS, Neg liver ROS,,,  Endo/Other  negative endocrine ROS    Renal/GU negative Renal ROS  negative genitourinary   Musculoskeletal   Abdominal   Peds  Hematology  (+) Blood dyscrasia, anemia   Anesthesia Other Findings   Reproductive/Obstetrics negative OB ROS                             Anesthesia Physical Anesthesia Plan  ASA: 2  Anesthesia Plan: General   Post-op Pain Management: Tylenol PO (pre-op)*   Induction: Intravenous  PONV Risk Score and Plan: 4 or greater and Dexamethasone, Midazolam and Ondansetron  Airway Management Planned: LMA  Additional Equipment:   Intra-op Plan:   Post-operative Plan: Extubation in OR  Informed Consent: I have reviewed the patients History and Physical, chart, labs and discussed the procedure including the risks, benefits and alternatives for the proposed anesthesia with the patient or authorized representative who has indicated his/her understanding and acceptance.     Dental advisory given  Plan Discussed with: CRNA  Anesthesia Plan Comments:        Anesthesia Quick Evaluation

## 2022-11-07 NOTE — Anesthesia Postprocedure Evaluation (Signed)
Anesthesia Post Note  Patient: Jill Patel  Procedure(s) Performed: CYSTOSCOPY, LEFT URETEROSCOPY, HOLMIUM LASER LITHOTRIPSY, STONE EXTRACTION, LEFT RETROGRADE PYLEOGRAM AND LEFT URETERAL STENT EXCHANGE (Left: Urethra)     Patient location during evaluation: PACU Anesthesia Type: General Level of consciousness: awake and alert Pain management: pain level controlled Vital Signs Assessment: post-procedure vital signs reviewed and stable Respiratory status: spontaneous breathing, nonlabored ventilation, respiratory function stable and patient connected to nasal cannula oxygen Cardiovascular status: blood pressure returned to baseline and stable Postop Assessment: no apparent nausea or vomiting Anesthetic complications: no  No notable events documented.  Last Vitals:  Vitals:   11/07/22 1230 11/07/22 1248  BP: 117/76 127/85  Pulse:  60  Resp:  16  Temp:  36.7 C  SpO2:  100%    Last Pain:  Vitals:   11/07/22 1248  TempSrc: Oral  PainSc: 0-No pain                 Kennieth Rad

## 2022-11-07 NOTE — Discharge Instructions (Signed)
No acetaminophen/Tylenol until after 2:30pm today if needed for pain.    CYSTOSCOPY HOME CARE INSTRUCTIONS  Activity: Rest for the remainder of the day.  Do not drive or operate equipment today.  You may resume normal activities in one to two days as instructed by your physician.   Meals: Drink plenty of liquids and eat light foods such as gelatin or soup this evening.  You may return to a normal meal plan tomorrow.  Return to Work: You may return to work in one to two days or as instructed by your physician.  Special Instructions / Symptoms: Call your physician if any of these symptoms occur:   -persistent or heavy bleeding  -bleeding which continues after first few urination  -large blood clots that are difficult to pass  -urine stream diminishes or stops completely  -fever equal to or higher than 101 degrees Farenheit.  -cloudy urine with a strong, foul odor  -severe pain  Females should always wipe from front to back after elimination.  You may feel some burning pain when you urinate.  This should disappear with time.  Applying moist heat to the lower abdomen or a hot tub bath may help relieve the pain. \     Post Anesthesia Home Care Instructions  Activity: Get plenty of rest for the remainder of the day. A responsible individual must stay with you for 24 hours following the procedure.  For the next 24 hours, DO NOT: -Drive a car -Advertising copywriter -Drink alcoholic beverages -Take any medication unless instructed by your physician -Make any legal decisions or sign important papers.  Meals: Start with liquid foods such as gelatin or soup. Progress to regular foods as tolerated. Avoid greasy, spicy, heavy foods. If nausea and/or vomiting occur, drink only clear liquids until the nausea and/or vomiting subsides. Call your physician if vomiting continues.  Special Instructions/Symptoms: Your throat may feel dry or sore from the anesthesia or the breathing tube placed in  your throat during surgery. If this causes discomfort, gargle with warm salt water. The discomfort should disappear within 24 hours.

## 2022-11-12 ENCOUNTER — Encounter (HOSPITAL_BASED_OUTPATIENT_CLINIC_OR_DEPARTMENT_OTHER): Payer: Self-pay | Admitting: Urology

## 2022-11-14 LAB — CALCULI, WITH PHOTOGRAPH (CLINICAL LAB)
Carbonate Apatite: 50 %
Mg NH4 PO4 (Struvite): 50 %
Weight Calculi: 69 mg

## 2022-12-19 DIAGNOSIS — N202 Calculus of kidney with calculus of ureter: Secondary | ICD-10-CM | POA: Diagnosis not present

## 2022-12-22 ENCOUNTER — Encounter (HOSPITAL_BASED_OUTPATIENT_CLINIC_OR_DEPARTMENT_OTHER): Payer: Self-pay

## 2022-12-22 ENCOUNTER — Emergency Department (HOSPITAL_BASED_OUTPATIENT_CLINIC_OR_DEPARTMENT_OTHER)
Admission: EM | Admit: 2022-12-22 | Discharge: 2022-12-22 | Disposition: A | Discharge status: Home / Self Care | Payer: Medicaid Other | Attending: Emergency Medicine | Admitting: Emergency Medicine

## 2022-12-22 ENCOUNTER — Emergency Department (HOSPITAL_BASED_OUTPATIENT_CLINIC_OR_DEPARTMENT_OTHER): Payer: Medicaid Other

## 2022-12-22 ENCOUNTER — Other Ambulatory Visit: Payer: Self-pay

## 2022-12-22 DIAGNOSIS — J209 Acute bronchitis, unspecified: Secondary | ICD-10-CM | POA: Insufficient documentation

## 2022-12-22 DIAGNOSIS — Z7951 Long term (current) use of inhaled steroids: Secondary | ICD-10-CM | POA: Insufficient documentation

## 2022-12-22 DIAGNOSIS — R0789 Other chest pain: Secondary | ICD-10-CM | POA: Diagnosis not present

## 2022-12-22 DIAGNOSIS — J45909 Unspecified asthma, uncomplicated: Secondary | ICD-10-CM | POA: Diagnosis not present

## 2022-12-22 DIAGNOSIS — Z1152 Encounter for screening for COVID-19: Secondary | ICD-10-CM | POA: Diagnosis not present

## 2022-12-22 DIAGNOSIS — R911 Solitary pulmonary nodule: Secondary | ICD-10-CM | POA: Diagnosis not present

## 2022-12-22 DIAGNOSIS — D649 Anemia, unspecified: Secondary | ICD-10-CM | POA: Insufficient documentation

## 2022-12-22 DIAGNOSIS — R059 Cough, unspecified: Secondary | ICD-10-CM | POA: Diagnosis not present

## 2022-12-22 DIAGNOSIS — R042 Hemoptysis: Secondary | ICD-10-CM | POA: Diagnosis not present

## 2022-12-22 LAB — RESP PANEL BY RT-PCR (RSV, FLU A&B, COVID)  RVPGX2
Influenza A by PCR: NEGATIVE
Influenza B by PCR: NEGATIVE
Resp Syncytial Virus by PCR: NEGATIVE
SARS Coronavirus 2 by RT PCR: NEGATIVE

## 2022-12-22 LAB — CBC WITH DIFFERENTIAL/PLATELET
Abs Immature Granulocytes: 0.02 10*3/uL (ref 0.00–0.07)
Basophils Absolute: 0 10*3/uL (ref 0.0–0.1)
Basophils Relative: 0 %
Eosinophils Absolute: 0.1 10*3/uL (ref 0.0–0.5)
Eosinophils Relative: 1 %
HCT: 35.7 % — ABNORMAL LOW (ref 36.0–46.0)
Hemoglobin: 11.6 g/dL — ABNORMAL LOW (ref 12.0–15.0)
Immature Granulocytes: 0 %
Lymphocytes Relative: 25 %
Lymphs Abs: 1.9 10*3/uL (ref 0.7–4.0)
MCH: 28.9 pg (ref 26.0–34.0)
MCHC: 32.5 g/dL (ref 30.0–36.0)
MCV: 89 fL (ref 80.0–100.0)
Monocytes Absolute: 0.7 10*3/uL (ref 0.1–1.0)
Monocytes Relative: 9 %
Neutro Abs: 5.1 10*3/uL (ref 1.7–7.7)
Neutrophils Relative %: 65 %
Platelets: 256 10*3/uL (ref 150–400)
RBC: 4.01 MIL/uL (ref 3.87–5.11)
RDW: 12.5 % (ref 11.5–15.5)
WBC: 7.8 10*3/uL (ref 4.0–10.5)
nRBC: 0 % (ref 0.0–0.2)

## 2022-12-22 LAB — BASIC METABOLIC PANEL
Anion gap: 8 (ref 5–15)
BUN: 12 mg/dL (ref 6–20)
CO2: 24 mmol/L (ref 22–32)
Calcium: 8.8 mg/dL — ABNORMAL LOW (ref 8.9–10.3)
Chloride: 105 mmol/L (ref 98–111)
Creatinine, Ser: 0.71 mg/dL (ref 0.44–1.00)
GFR, Estimated: >60 mL/min (ref >60)
Glucose, Bld: 98 mg/dL (ref 70–99)
Potassium: 3.6 mmol/L (ref 3.5–5.1)
Sodium: 137 mmol/L (ref 135–145)

## 2022-12-22 LAB — HCG, QUANTITATIVE, PREGNANCY: hCG, Beta Chain, Quant, S: <1 m[IU]/mL (ref <5)

## 2022-12-22 LAB — GROUP A STREP BY PCR: Group A Strep by PCR: NOT DETECTED

## 2022-12-22 LAB — D-DIMER, QUANTITATIVE: D-Dimer, Quant: 0.69 ug/mL-FEU — ABNORMAL HIGH (ref 0.00–0.50)

## 2022-12-22 LAB — TROPONIN I (HIGH SENSITIVITY): Troponin I (High Sensitivity): <2 ng/L (ref <18)

## 2022-12-22 MED ORDER — ACETAMINOPHEN 500 MG PO TABS
1000.0000 mg | ORAL_TABLET | Freq: Once | ORAL | Status: AC
  Administered 2022-12-22: 1000 mg via ORAL
  Filled 2022-12-22: qty 2

## 2022-12-22 MED ORDER — BENZONATATE 100 MG PO CAPS: 100.0000 mg | ORAL_CAPSULE | Freq: Three times a day (TID) | ORAL | 0 refills | Status: DC

## 2022-12-22 MED ORDER — METHYLPREDNISOLONE 4 MG PO TBPK: ORAL_TABLET | ORAL | 0 refills | Status: DC

## 2022-12-22 MED ORDER — BENZONATATE 100 MG PO CAPS
100.0000 mg | ORAL_CAPSULE | Freq: Once | ORAL | Status: AC
  Administered 2022-12-22: 100 mg via ORAL
  Filled 2022-12-22: qty 1

## 2022-12-22 MED ORDER — LIDOCAINE VISCOUS HCL 2 % MT SOLN
15.0000 mL | Freq: Once | OROMUCOSAL | Status: AC
  Administered 2022-12-22: 15 mL via OROMUCOSAL
  Filled 2022-12-22: qty 15

## 2022-12-22 MED ORDER — IOHEXOL 350 MG/ML SOLN
75.0000 mL | Freq: Once | INTRAVENOUS | Status: AC | PRN
  Administered 2022-12-22: 75 mL via INTRAVENOUS

## 2022-12-22 NOTE — ED Provider Notes (Signed)
[facility] Provider Note   CSN: [id number] Arrival date & time: 12/22/22  0636     History  Chief Complaint  Patient presents with   Sore Throat    Jill Patel is a 27 y.o. female.   Sore Throat Associated symptoms include chest pain and shortness of breath.     27 year old female with last medical history significant for asthma, anemia, anxiety, depression who presents to the emergency department with a sore throat for the past 3 days.  The patient states that she developed a cough overnight last night and began coughing up a mild amount of blood-tinged sputum this morning.  She endorses pleuritic chest discomfort.  She denies any shortness of breath.  She denies any history of DVT or PE.  She denies any lower extremity swelling.  She denies any ripping tearing chest pain.  She denies any chest pressure.  She denies any fevers.  She states that still hurts to swallow but denies any tongue elevation, difficulty with range of motion of the head or neck, unilateral swelling in the neck.  Home Medications Prior to Admission medications   Medication Sig Start Date End Date Taking? Authorizing Provider  benzonatate (TESSALON) 100 MG capsule Take 1 capsule (100 mg total) by mouth every 8 (eight) hours. 12/22/22  Yes Ernie Avena, MD  methylPREDNISolone (MEDROL DOSEPAK) 4 MG TBPK tablet Take as prescribed on the box 12/22/22  Yes Ernie Avena, MD  fluticasone [facility]) 50 MCG/ACT nasal spray Place 2 sprays into both nostrils daily. 11/03/22   Calvert Cantor, MD  tamsulosin (FLOMAX) 0.4 MG CAPS capsule Take 1 capsule (0.4 mg total) by mouth daily. 11/07/22   Crist Fat, MD  traMADol (ULTRAM) 50 MG tablet Take 1-2 tablets (50-100 mg total) by mouth every 6 (six) hours as needed for moderate pain. 11/07/22   Crist Fat, MD      Allergies    Kiwi extract, Aleve [naproxen sodium], Mango flavor, Other, and Pineapple    Review  of Systems   Review of Systems  HENT:  Positive for sore throat.   Respiratory:  Positive for cough and shortness of breath.   Cardiovascular:  Positive for chest pain.  All other systems reviewed and are negative.   Physical Exam Updated Vital Signs BP 115/71   Pulse 83   Temp 98.5 F (36.9 C)   Resp 16   Ht 5\' 4"  (1.626 m)   Wt 52.2 kg   LMP 11/30/2022 (Exact Date)   SpO2 98%   BMI 19.74 kg/m  Physical Exam Vitals and nursing note reviewed.  Constitutional:      General: She is not in acute distress.    Appearance: She is well-developed.  HENT:     Head: Normocephalic and atraumatic.     Mouth/Throat:     Pharynx: Posterior oropharyngeal erythema present. No oropharyngeal exudate.  Eyes:     Conjunctiva/sclera: Conjunctivae normal.  Cardiovascular:     Rate and Rhythm: Normal rate and regular rhythm.     Heart sounds: No murmur heard. Pulmonary:     Effort: Pulmonary effort is normal. No respiratory distress.     Breath sounds: Normal breath sounds.  Abdominal:     Palpations: Abdomen is soft.     Tenderness: There is no abdominal tenderness.  Musculoskeletal:        General: No swelling.     Cervical back: Neck supple.  Skin:    General: Skin  is warm and dry.     Capillary Refill: Capillary refill takes less than 2 seconds.  Neurological:     Mental Status: She is alert.  Psychiatric:        Mood and Affect: Mood normal.     ED Results / Procedures / Treatments   Labs (all labs ordered are listed, but only abnormal results are displayed) Labs Reviewed  CBC WITH DIFFERENTIAL/PLATELET - Abnormal; Notable for the following components:      Result Value   Hemoglobin 11.6 (*)    HCT 35.7 (*)    All other components within normal limits  BASIC METABOLIC PANEL - Abnormal; Notable for the following components:   Calcium 8.8 (*)    All other components within normal limits  D-DIMER, QUANTITATIVE - Abnormal; Notable for the following components:   D-Dimer,  Quant 0.69 (*)    All other components within normal limits  GROUP A STREP BY PCR  RESP PANEL BY RT-PCR (RSV, FLU A&B, COVID)  RVPGX2  HCG, QUANTITATIVE, PREGNANCY  TROPONIN I (HIGH SENSITIVITY)    EKG EKG Interpretation  Date/Time:  Sunday December 22 2022 07:43:42 EDT Ventricular Rate:  93 PR Interval:  140 QRS Duration: 90 QT Interval:  353 QTC Calculation: 439 R Axis:   81 Text Interpretation: Sinus rhythm Confirmed by Ernie Avena ([id number]) on 12/22/2022 7:45:18 AM  Radiology CT Angio Chest PE W and/or Wo Contrast  Result Date: 12/22/2022 CLINICAL DATA:  Cough. Sore throat for 3 days. Headache and hot flashes. EXAM: CT ANGIOGRAPHY CHEST WITH CONTRAST TECHNIQUE: Multidetector CT imaging of the chest was performed using the standard protocol during bolus administration of intravenous contrast. Multiplanar CT image reconstructions and MIPs were obtained to evaluate the vascular anatomy. RADIATION DOSE REDUCTION: This exam was performed according to the departmental dose-optimization program which includes automated exposure control, adjustment of the mA and/or kV according to patient size and/or use of iterative reconstruction technique. CONTRAST:  75mL OMNIPAQUE IOHEXOL 350 MG/ML SOLN COMPARISON:  11/01/2022.  Current chest radiograph. FINDINGS: Cardiovascular: Pulmonary arteries are well opacified. There is no evidence of a pulmonary embolism. Normal heart and great vessels. Mediastinum/Nodes: No neck base, mediastinal or hilar masses or enlarged lymph nodes. Trachea and esophagus are unremarkable. Lungs/Pleura: 5 mm nodule, abutting the pleura, lateral right lower lobe, image 45, series 5. 3 mm nodule, abutting the left oblique fissure, left lower lobe, image 48, series 5. These are both stable. In this age group, the should be considered benign with no additional follow-up recommended. Lungs otherwise clear.  No pleural effusion.  No pneumothorax. Upper Abdomen: Unremarkable. Musculoskeletal:  Normal. Review of the MIP images confirms the above findings. IMPRESSION: 1. No evidence of a pulmonary embolism. 2. Two small benign pulmonary nodules, stable from the prior CT. No follow-up recommended in this age group. Electronically Signed   By: Amie Portland M.D.   On: 12/22/2022 09:21   DG Chest Portable 1 View  Result Date: 12/22/2022 CLINICAL DATA:  Coughing and hemoptysis. EXAM: PORTABLE CHEST 1 VIEW COMPARISON:  CTA chest 11/01/2022 FINDINGS: The heart size and mediastinal contours are within normal limits. Both lungs are clear. The visualized skeletal structures are unremarkable. IMPRESSION: No active disease. Comment: The CTA chest demonstrated a pleural-based 6 mm right lower lobe nodule laterally which is not visible radiographically. Agree with prior recommendation for follow-up chest CT 6-12 months from the date of that study. Electronically Signed   By: Almira Bar M.D.   On: 12/22/2022 07:43  Procedures Procedures    Medications Ordered in ED Medications  lidocaine (XYLOCAINE) 2 % viscous mouth solution 15 mL (15 mLs Mouth/Throat Given 12/22/22 0744)  acetaminophen (TYLENOL) tablet 1,000 mg (1,000 mg Oral Given 12/22/22 0744)  benzonatate (TESSALON) capsule 100 mg (100 mg Oral Given 12/22/22 0744)  iohexol (OMNIPAQUE) 350 MG/ML injection 75 mL (75 mLs Intravenous Contrast Given 12/22/22 0858)    ED Course/ Medical Decision Making/ A&P Clinical Course as of 12/22/22 0932  Sun Dec 22, 2022  0830 D-Dimer, Quant(!): 0.69 [JL]    Clinical Course User Index [JL] Ernie Avena, MD                             Medical Decision Making Amount and/or Complexity of Data Reviewed Labs: ordered. Decision-making details documented in ED Course. Radiology: ordered.  Risk OTC drugs. Prescription drug management.     27 year old female with last medical history significant for asthma, anemia, anxiety, depression who presents to the emergency department with a sore throat for the  past 3 days.  The patient states that she developed a cough overnight last night and began coughing up a mild amount of blood-tinged sputum this morning.  She endorses pleuritic chest discomfort.  She denies any shortness of breath.  She denies any history of DVT or PE.  She denies any lower extremity swelling.  She denies any ripping tearing chest pain.  She denies any chest pressure.  She denies any fevers.  She states that still hurts to swallow but denies any tongue elevation, difficulty with range of motion of the head or neck, unilateral swelling in the neck.  On arrival, the patient was vitally stable.  Physical exam significant for oropharyngeal erythema, no tongue elevation, no trismus, no evidence of peritonsillar abscess.  Patient with lungs clear to auscultation bilaterally.  Presenting with pleuritic chest discomfort.  Differential diagnosis includes most likely viral upper respiratory infection and acute bronchitis.  Considered pneumothorax, considered pulmonary embolism.  Less likely ACS, pneumonia.  Considered strep pharyngitis although feel this is less likely.  Less likely lung mass, pericarditis/myocarditis.  CXR: IMPRESSION:  No active disease.    Comment: The CTA chest demonstrated a pleural-based 6 mm right lower  lobe nodule laterally which is not visible radiographically. Agree  with prior recommendation for follow-up chest CT 6-12 months from  the date of that study.   On chart review, the patient had found to have bilateral pulmonary nodules measuring up to 6 mm in size on CTA Chest on 11/01/2022, noncontrast chest CT at 6 to 12 months have been recommended for follow-up.  Laboratory evaluation significant for D-dimer elevated at 0.69, COVID-19, influenza, RSV PCR testing negative, hCG negative, troponin less than 2, BMP unremarkable, CBC without a leukocytosis, mild anemia at 11.6.  CTA PE: IMPRESSION:  1. No evidence of a pulmonary embolism.  2. Two small benign  pulmonary nodules, stable from the prior CT. No  follow-up recommended in this age group.    Patient without evidence of PE, stable pulmonary nodules noted on CT with no indication for further follow-up.  Symptoms are consistent with likely viral upper respiratory infection and acute bronchitis.  Evidence for pneumonia on CT imaging and the patient is afebrile and without a leukocytosis.  Will treat with Tessalon and a Medrol dose pack.  Return precautions advised in the event of any severe worsening of symptoms.  Overall stable for discharge at this time.  Final Clinical Impression(s) / ED Diagnoses Final diagnoses:  Acute bronchitis, unspecified organism    Rx / DC Orders ED Discharge Orders          Ordered    methylPREDNISolone (MEDROL DOSEPAK) 4 MG TBPK tablet        12/22/22 0931    benzonatate (TESSALON) 100 MG capsule  Every 8 hours        12/22/22 0931              Ernie Avena, MD 12/22/22 [phone]

## 2022-12-22 NOTE — Discharge Instructions (Addendum)
Your symptoms are consistent with likely acute bronchitis which is likely due to a viral illness.  You are CT imaging was negative for blood clot and you had 2 pulmonary nodules which were deemed to be stable and require no further follow-up.  Recommend symptomatic management with continue oral rehydration, Tylenol or ibuprofen for pain and fever control.

## 2022-12-22 NOTE — ED Triage Notes (Signed)
POV from home, A&O x 4, amb  C/o sore throat x 3 days, cough that began last night, headache and hot flashes.

## 2023-01-08 ENCOUNTER — Other Ambulatory Visit: Payer: Self-pay

## 2023-01-08 ENCOUNTER — Emergency Department (HOSPITAL_BASED_OUTPATIENT_CLINIC_OR_DEPARTMENT_OTHER)
Admission: EM | Admit: 2023-01-08 | Discharge: 2023-01-08 | Disposition: A | Discharge status: Home / Self Care | Payer: Medicaid Other | Attending: Emergency Medicine | Admitting: Emergency Medicine

## 2023-01-08 ENCOUNTER — Encounter (HOSPITAL_BASED_OUTPATIENT_CLINIC_OR_DEPARTMENT_OTHER): Payer: Self-pay

## 2023-01-08 DIAGNOSIS — R110 Nausea: Secondary | ICD-10-CM | POA: Diagnosis not present

## 2023-01-08 DIAGNOSIS — Z0189 Encounter for other specified special examinations: Secondary | ICD-10-CM

## 2023-01-08 DIAGNOSIS — Z01818 Encounter for other preprocedural examination: Secondary | ICD-10-CM | POA: Diagnosis present

## 2023-01-08 DIAGNOSIS — R5383 Other fatigue: Secondary | ICD-10-CM | POA: Insufficient documentation

## 2023-01-08 DIAGNOSIS — Z3202 Encounter for pregnancy test, result negative: Secondary | ICD-10-CM | POA: Insufficient documentation

## 2023-01-08 DIAGNOSIS — N644 Mastodynia: Secondary | ICD-10-CM | POA: Diagnosis not present

## 2023-01-08 LAB — HCG, QUANTITATIVE, PREGNANCY: hCG, Beta Chain, Quant, S: <1 m[IU]/mL (ref <5)

## 2023-01-08 NOTE — ED Triage Notes (Signed)
Patient here POV from Home.  Notes Bilateral breast Pain, nausea, and Sleepiness for a few weeks. States her previous LMP was short. Seeks Serum Pregnancy Test. Home Urine Test was negative.   NAD Noted during Triage. A&Ox4. Gcs 15. Ambulatory.

## 2023-01-08 NOTE — Discharge Instructions (Signed)
It was a pleasure taking care of you today!   Your blood pregnancy test was negative today. Call your OBGYN specialist to set up a closer follow up appointment regarding todays ED visit. You will be set up with a primary care provider to establish care. Return to the ED if you are experiencing increasing/worsening symptoms.

## 2023-01-08 NOTE — ED Notes (Signed)
Discharge instructions and follow up care reviewed and explained, pt verbalized understanding and had no further questions on d/c. Pt caox4, ambulatory, NAD on d/c. 

## 2023-01-08 NOTE — ED Provider Notes (Signed)
[place] EMERGENCY DEPARTMENT AT [facility] Provider Note   CSN: [id number] Arrival date & time: 01/08/23  1456     History  Chief Complaint  Patient presents with   Possible Pregnancy    Jill Patel is a 27 y.o. female who presents to the ED with concerns for possible pregnancy. Her LMP was 01/01/2023. She notes that her menstrual cycles have been irregular since she has been seen in the ED in April. She stopped breast feeding in April as well. Has associated bilateral breast pain, nausea, fatigue x several weeks. Had a urine pregnancy test that she completed at home that was negative. Voices concern for blood pregnancy test today. Has an appointment with her OBGYN specialist on 02/18/23.   The history is provided by the patient. No language interpreter was used.       Home Medications Prior to Admission medications   Medication Sig Start Date End Date Taking? Authorizing Provider  benzonatate (TESSALON) 100 MG capsule Take 1 capsule (100 mg total) by mouth every 8 (eight) hours. 12/22/22   Ernie Avena, MD  fluticasone (FLONASE) 50 MCG/ACT nasal spray Place 2 sprays into both nostrils daily. 11/03/22   Calvert Cantor, MD  methylPREDNISolone (MEDROL DOSEPAK) 4 MG TBPK tablet Take as prescribed on the box 12/22/22   Ernie Avena, MD  tamsulosin (FLOMAX) 0.4 MG CAPS capsule Take 1 capsule (0.4 mg total) by mouth daily. 11/07/22   Crist Fat, MD  traMADol (ULTRAM) 50 MG tablet Take 1-2 tablets (50-100 mg total) by mouth every 6 (six) hours as needed for moderate pain. 11/07/22   Crist Fat, MD      Allergies    Kiwi extract, Aleve [naproxen sodium], Mango flavor, Other, and Pineapple    Review of Systems   Review of Systems  All other systems reviewed and are negative.   Physical Exam Updated Vital Signs BP 118/71 (BP Location: Right Arm)   Pulse 72   Temp 98.3 F (36.8 C) (Oral)   Resp 18   Ht 5\' 3"  (1.6 m)   Wt 52.6 kg   LMP 11/30/2022  (Exact Date)   SpO2 100%   BMI 20.55 kg/m  Physical Exam Vitals and nursing note reviewed.  Constitutional:      General: She is not in acute distress.    Appearance: She is not diaphoretic.  HENT:     Head: Normocephalic and atraumatic.     Mouth/Throat:     Pharynx: No oropharyngeal exudate.  Eyes:     General: No scleral icterus.    Conjunctiva/sclera: Conjunctivae normal.  Cardiovascular:     Rate and Rhythm: Normal rate and regular rhythm.     Pulses: Normal pulses.     Heart sounds: Normal heart sounds.  Pulmonary:     Effort: Pulmonary effort is normal. No respiratory distress.     Breath sounds: Normal breath sounds. No wheezing.  Abdominal:     General: Bowel sounds are normal.     Palpations: Abdomen is soft. There is no mass.     Tenderness: There is no abdominal tenderness. There is no guarding or rebound.     Comments: No abdominal TTP  Musculoskeletal:        General: Normal range of motion.     Cervical back: Normal range of motion and neck supple.  Skin:    General: Skin is warm and dry.  Neurological:     Mental Status: She is alert.  Psychiatric:  Behavior: Behavior normal.     ED Results / Procedures / Treatments   Labs (all labs ordered are listed, but only abnormal results are displayed) Labs Reviewed  HCG, QUANTITATIVE, PREGNANCY    EKG None  Radiology No results found.  Procedures Procedures    Medications Ordered in ED Medications - No data to display  ED Course/ Medical Decision Making/ A&P Clinical Course as of 01/08/23 1812  Wed Jan 08, 2023  1618 HCG, Beta Chain, Quant, S: <1 [SB]  1653 Discussed with patient lab findings. Discussed with patient discharge treatment plan. Offered to send patient home with Rx for zofran, patient declines at this time. Discussed with patient that we will provide her with [facility] care for PCP needs. Answered all available questions. Pt appears safe for discharge at this time.  [SB]  1703  Discussed with [facility] who will get patient appointment with Internal Medicine Clinic and call patient with follow up information.  [SB]    Clinical Course User Index [SB] Danzel Marszalek A, PA-C                             Medical Decision Making Amount and/or Complexity of Data Reviewed Labs: ordered. Decision-making details documented in ED Course.   Pt presents with concerns for bilateral breast pain, nausea onset several weeks. Vital signs, pt afebrile. On exam, pt with no acute cardiovascular, respiratory, abdominal exam findings.   Labs:  I ordered, and personally interpreted labs.  The pertinent results include:   Hcg negative   Social Determinants of Health: Pt doesn't have a PCP at this time.    Disposition: Presentation suspicious for need for hCG blood test. Doubt pregnancy at this time. After consideration of the diagnostic results and the patients response to treatment, I feel that the patient would benefit from Discharge home. Pt with a follow up appointment with her OBGYN specialist on 02/18/23. Offered Rx antiemetic, patient declines at this time. Supportive care measures and strict return precautions discussed with patient at bedside. Pt acknowledges and verbalizes understanding. Pt appears safe for discharge. Follow up as indicated in discharge paperwork.    This chart was dictated using voice recognition software, Dragon. Despite the best efforts of this provider to proofread and correct errors, errors may still occur which can change documentation meaning.  Final Clinical Impression(s) / ED Diagnoses Final diagnoses:  Encounter for laboratory examination    Rx / DC Orders ED Discharge Orders     None         Sanel Stemmer A, PA-C 01/08/23 1813    Derwood Kaplan, MD 01/14/23 0720

## 2023-01-28 ENCOUNTER — Other Ambulatory Visit: Payer: Self-pay

## 2023-01-28 ENCOUNTER — Encounter (HOSPITAL_BASED_OUTPATIENT_CLINIC_OR_DEPARTMENT_OTHER): Payer: Self-pay | Admitting: Pediatrics

## 2023-01-28 ENCOUNTER — Emergency Department (HOSPITAL_BASED_OUTPATIENT_CLINIC_OR_DEPARTMENT_OTHER)
Admission: EM | Admit: 2023-01-28 | Discharge: 2023-01-28 | Disposition: A | Discharge status: Home / Self Care | Payer: Medicaid Other | Attending: Emergency Medicine | Admitting: Emergency Medicine

## 2023-01-28 DIAGNOSIS — R509 Fever, unspecified: Secondary | ICD-10-CM | POA: Diagnosis present

## 2023-01-28 DIAGNOSIS — J45909 Unspecified asthma, uncomplicated: Secondary | ICD-10-CM | POA: Diagnosis not present

## 2023-01-28 DIAGNOSIS — Z20822 Contact with and (suspected) exposure to covid-19: Secondary | ICD-10-CM | POA: Insufficient documentation

## 2023-01-28 DIAGNOSIS — Z7951 Long term (current) use of inhaled steroids: Secondary | ICD-10-CM | POA: Insufficient documentation

## 2023-01-28 DIAGNOSIS — B349 Viral infection, unspecified: Secondary | ICD-10-CM | POA: Diagnosis not present

## 2023-01-28 LAB — RESP PANEL BY RT-PCR (RSV, FLU A&B, COVID)  RVPGX2
Influenza A by PCR: NEGATIVE
Influenza B by PCR: NEGATIVE
Resp Syncytial Virus by PCR: NEGATIVE
SARS Coronavirus 2 by RT PCR: NEGATIVE

## 2023-01-28 LAB — GROUP A STREP BY PCR: Group A Strep by PCR: NOT DETECTED

## 2023-01-28 MED ORDER — ACETAMINOPHEN 325 MG PO TABS
650.0000 mg | ORAL_TABLET | Freq: Once | ORAL | Status: AC
  Administered 2023-01-28: 650 mg via ORAL
  Filled 2023-01-28: qty 2

## 2023-01-28 NOTE — Discharge Instructions (Signed)
Evaluation today was overall reassuring.  You are negative for strep, COVID, RSV and flu.  However due to suspect that it is a viral process that is causing her symptoms.  The treatment is supportive at home.  This includes rest, hydration with Gatorade and water and he can take Tylenol and ibuprofen as needed for symptomatic and fever relief.  Please return if your symptoms get worse or you have any other concerning symptoms.  Otherwise she can follow-up with your PCP.

## 2023-01-28 NOTE — ED Triage Notes (Signed)
C/O fever, chills, body aches and hurts to swallow x 3 days
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
## 2023-01-28 NOTE — ED Provider Notes (Signed)
[place] EMERGENCY DEPARTMENT AT [facility] Provider Note   CSN: [id number] Arrival date & time: 01/28/23  4098     History  Chief Complaint  Patient presents with   Generalized Body Aches   Sore Throat   Fever   HPI Jill Patel is a 27 y.o. female with history of asthma and anemia presenting for generalized bodyaches, sore throat and fever that started yesterday.  States her fever at home was around 101.2.  She took some Tylenol and improved to 99.9.  Denies sick contacts.  Denies shortness of breath or chest pain.  Denies headache.   Sore Throat  Fever      Home Medications Prior to Admission medications   Medication Sig Start Date End Date Taking? Authorizing Provider  benzonatate (TESSALON) 100 MG capsule Take 1 capsule (100 mg total) by mouth every 8 (eight) hours. 12/22/22   Ernie Avena, MD  fluticasone (FLONASE) 50 MCG/ACT nasal spray Place 2 sprays into both nostrils daily. 11/03/22   Calvert Cantor, MD  methylPREDNISolone (MEDROL DOSEPAK) 4 MG TBPK tablet Take as prescribed on the box 12/22/22   Ernie Avena, MD  tamsulosin (FLOMAX) 0.4 MG CAPS capsule Take 1 capsule (0.4 mg total) by mouth daily. 11/07/22   Crist Fat, MD  traMADol (ULTRAM) 50 MG tablet Take 1-2 tablets (50-100 mg total) by mouth every 6 (six) hours as needed for moderate pain. 11/07/22   Crist Fat, MD      Allergies    Kiwi extract, Aleve [naproxen sodium], Mango flavor, Other, and Pineapple    Review of Systems   Review of Systems  Constitutional:  Positive for fever.    Physical Exam Updated Vital Signs BP 103/71   Pulse 96   Temp 99.4 F (37.4 C) (Oral)   Resp 20   Ht 5\' 3"  (1.6 m)   Wt 52.7 kg   LMP 01/25/2023   SpO2 97%   BMI 20.58 kg/m  Physical Exam Vitals and nursing note reviewed.  HENT:     Head: Normocephalic and atraumatic.     Mouth/Throat:     Mouth: Mucous membranes are moist.     Pharynx: Oropharynx is clear. Uvula midline.  No pharyngeal swelling, oropharyngeal exudate, posterior oropharyngeal erythema or uvula swelling.  Eyes:     General:        Right eye: No discharge.        Left eye: No discharge.     Conjunctiva/sclera: Conjunctivae normal.  Cardiovascular:     Rate and Rhythm: Normal rate and regular rhythm.     Pulses: Normal pulses.     Heart sounds: Normal heart sounds.  Pulmonary:     Effort: Pulmonary effort is normal.     Breath sounds: Normal breath sounds.  Abdominal:     General: Abdomen is flat.     Palpations: Abdomen is soft.  Lymphadenopathy:     Cervical: No cervical adenopathy.  Skin:    General: Skin is warm and dry.  Neurological:     General: No focal deficit present.  Psychiatric:        Mood and Affect: Mood normal.     ED Results / Procedures / Treatments   Labs (all labs ordered are listed, but only abnormal results are displayed) Labs Reviewed  GROUP A STREP BY PCR  RESP PANEL BY RT-PCR (RSV, FLU A&B, COVID)  [id number]    EKG None  Radiology No results found.  Procedures Procedures  Medications Ordered in ED Medications  acetaminophen (TYLENOL) tablet 650 mg (has no administration in time range)    ED Course/ Medical Decision Making/ A&P                             Medical Decision Making  27 year old well-appearing female presenting for generalized bodyaches, sore throat and fever that started yesterday.  Exam was unremarkable.  DDx includes COVID or other URI, strep throat, and pneumonia.  Pneumonia unlikely given no cough and lung sounds are clear.  Respiratory and strep PCR's were both negative.  Suspect symptoms are of viral etiology.  Treated with Tylenol.  Advised conservative treatment at home.  Discussed return precautions.  Advised to follow-up with her PCP if her symptoms persisted.  Vitals remained stable throughout encounter.  Discharged home.        Final Clinical Impression(s) / ED Diagnoses Final diagnoses:  Viral illness     Rx / DC Orders ED Discharge Orders     None         Gareth Eagle, PA-C 01/28/23 1102    Terrilee Files, MD 01/28/23 1700

## 2023-02-19 ENCOUNTER — Encounter: Payer: Self-pay | Admitting: Obstetrics and Gynecology

## 2023-02-19 ENCOUNTER — Other Ambulatory Visit (HOSPITAL_COMMUNITY)
Admission: RE | Admit: 2023-02-19 | Discharge: 2023-02-19 | Disposition: A | Discharge status: Home / Self Care | Payer: Medicaid Other | Source: Ambulatory Visit | Attending: Obstetrics and Gynecology | Admitting: Obstetrics and Gynecology

## 2023-02-19 ENCOUNTER — Ambulatory Visit: Payer: Medicaid Other | Admitting: Obstetrics and Gynecology

## 2023-02-19 VITALS — BP 111/71 | HR 59 | Ht 64.0 in | Wt 118.4 lb

## 2023-02-19 DIAGNOSIS — Z124 Encounter for screening for malignant neoplasm of cervix: Secondary | ICD-10-CM | POA: Diagnosis not present

## 2023-02-19 DIAGNOSIS — N760 Acute vaginitis: Secondary | ICD-10-CM

## 2023-02-19 DIAGNOSIS — B379 Candidiasis, unspecified: Secondary | ICD-10-CM

## 2023-02-19 DIAGNOSIS — Z113 Encounter for screening for infections with a predominantly sexual mode of transmission: Secondary | ICD-10-CM | POA: Diagnosis not present

## 2023-02-19 DIAGNOSIS — Z01419 Encounter for gynecological examination (general) (routine) without abnormal findings: Secondary | ICD-10-CM | POA: Diagnosis not present

## 2023-02-19 DIAGNOSIS — B9689 Other specified bacterial agents as the cause of diseases classified elsewhere: Secondary | ICD-10-CM

## 2023-02-19 NOTE — Progress Notes (Signed)
ANNUAL EXAM Patient name: Jill Patel MRN [id number]  Date of birth: 03-21-1996 Chief Complaint:   No chief complaint on file.  History of Present Illness:   Jill Patel is a 27 y.o. [phone]  female being seen today for a routine annual exam.  Current complaints: no complaints. Has been trying to conceive with partner last 4 months. Reports delivering baby almost 1 year ago (04/16/22), reports was breastfeeding for 6 months (stopped march) and cycles have been a little irregular since stopping breastfeeding.   Patient's last menstrual period was 01/25/2023.   The pregnancy intention screening data noted above was reviewed. Potential methods of contraception were discussed. The patient elected to proceed with No data recorded.   Last pap 10/03/21. Results were: NILM w/ HRHPV negative. H/O abnormal pap: no Last mammogram: n/a. Results were: N/A. Family h/o breast cancer: no Last colonoscopy: n/a. Results were: N/A. Family h/o colorectal cancer: no     03/27/2022    9:02 AM 01/24/2022    8:26 AM 09/17/2021    9:11 AM  Depression screen PHQ 2/9  Decreased Interest 1 0 1  Down, Depressed, Hopeless 1 0 1  PHQ - 2 Score 2 0 2  Altered sleeping 2 0 1  Tired, decreased energy 1 0 1  Change in appetite 0 0 1  Feeling bad or failure about yourself  1 0 0  Trouble concentrating 0 0 0  Moving slowly or fidgety/restless 0 0 0  Suicidal thoughts 0 0 0  PHQ-9 Score 6 0 5  Difficult doing work/chores   Somewhat difficult        03/27/2022    9:03 AM 01/24/2022    8:27 AM 09/17/2021    9:14 AM  GAD 7 : Generalized Anxiety Score  Nervous, Anxious, on Edge 2 0 3  Control/stop worrying 2 0 3  Worry too much - different things 1 1 3   Trouble relaxing 1 0 2  Restless 1 0 1  Easily annoyed or irritable 1 0 3  Afraid - awful might happen 0 0 1  Total GAD 7 Score 8 1 16   Anxiety Difficulty  Not difficult at all Not difficult at all     Review of Systems:   Pertinent items are  noted in HPI Denies any headaches, blurred vision, fatigue, shortness of breath, chest pain, abdominal pain, abnormal vaginal discharge/itching/odor/irritation, problems with periods, bowel movements, urination, or intercourse unless otherwise stated above. Pertinent History Reviewed:  Reviewed past medical,surgical, social and family history.  Reviewed problem list, medications and allergies. Physical Assessment:  There were no vitals filed for this visit.There is no height or weight on file to calculate BMI.        Physical Examination:   General appearance - well appearing, and in no distress  Mental status - alert, oriented to person, place, and time  Psych:  She has a normal mood and affect  Skin - warm and dry, normal color, no suspicious lesions noted  Chest - effort normal, all lung fields clear to auscultation bilaterally  Heart - normal rate and regular rhythm  Neck:  midline trachea, no thyromegaly or nodules  Breasts - breasts appear normal, no suspicious masses, no skin or nipple changes or  axillary nodes  Abdomen - soft, nontender, nondistended, no masses or organomegaly  Pelvic - VULVA: normal appearing vulva with no masses, tenderness or lesions  VAGINA: normal appearing vagina with normal color and discharge, no lesions  CERVIX: normal appearing  cervix without discharge or lesions, no CMT  Thin prep pap is done   Extremities:  No swelling or varicosities noted  Chaperone present for exam  No results found for this or any previous visit (from the past 24 hour(s)).  Assessment & Plan:  1. Well woman exam Discussed return to normal cycles after breastfeeding, discuss cycle tracking, fertile window, ovulation predictor kit and encouraged to follow up if  no success after a couple of months or hormones not detecting ovulation on predictor kits  2. Cervical cancer screening  - Cytology - PAP( [place])  3. Screen for STD (sexually transmitted disease)  - HIV antibody  (with reflex) - Hepatitis B Surface AntiGEN - Hepatitis C Antibody - RPR - Cervicovaginal ancillary only( [facility])   Labs/procedures today:   Mammogram: @ 25yo, or sooner if problems Colonoscopy: @ 25yo, or sooner if problems   Follow-up: Return in 1 year for annual well woman   Albertine Grates, FNP

## 2023-02-19 NOTE — Progress Notes (Signed)
Pt presents for AEX.  Last PAP 10-03-21, requesting PAP and STD testing.  Pt wants to conceive, trying for 4 months.

## 2023-02-21 MED ORDER — METRONIDAZOLE 500 MG PO TABS
500.0000 mg | ORAL_TABLET | Freq: Two times a day (BID) | ORAL | 0 refills | Status: AC
Start: 2023-02-21 — End: 2023-02-28

## 2023-02-21 MED ORDER — FLUCONAZOLE 150 MG PO TABS
150.0000 mg | ORAL_TABLET | Freq: Once | ORAL | 0 refills | Status: AC
Start: 2023-02-21 — End: 2023-02-21

## 2023-02-21 NOTE — Addendum Note (Signed)
Addended by: Sue Lush on: 02/21/2023 01:24 PM   Modules accepted: Orders

## 2023-02-25 ENCOUNTER — Other Ambulatory Visit: Payer: Self-pay

## 2023-02-25 ENCOUNTER — Emergency Department (HOSPITAL_COMMUNITY): Payer: Medicaid Other

## 2023-02-25 ENCOUNTER — Encounter (HOSPITAL_COMMUNITY): Payer: Self-pay

## 2023-02-25 ENCOUNTER — Emergency Department (HOSPITAL_COMMUNITY)
Admission: EM | Admit: 2023-02-25 | Discharge: 2023-02-25 | Disposition: A | Discharge status: Home / Self Care | Payer: Medicaid Other | Attending: Emergency Medicine | Admitting: Emergency Medicine

## 2023-02-25 DIAGNOSIS — S0990XA Unspecified injury of head, initial encounter: Secondary | ICD-10-CM | POA: Diagnosis not present

## 2023-02-25 DIAGNOSIS — R519 Headache, unspecified: Secondary | ICD-10-CM | POA: Insufficient documentation

## 2023-02-25 DIAGNOSIS — S199XXA Unspecified injury of neck, initial encounter: Secondary | ICD-10-CM | POA: Diagnosis not present

## 2023-02-25 DIAGNOSIS — M25532 Pain in left wrist: Secondary | ICD-10-CM | POA: Insufficient documentation

## 2023-02-25 DIAGNOSIS — M25512 Pain in left shoulder: Secondary | ICD-10-CM | POA: Diagnosis not present

## 2023-02-25 DIAGNOSIS — S50811A Abrasion of right forearm, initial encounter: Secondary | ICD-10-CM | POA: Diagnosis not present

## 2023-02-25 DIAGNOSIS — T07XXXA Unspecified multiple injuries, initial encounter: Secondary | ICD-10-CM | POA: Diagnosis not present

## 2023-02-25 DIAGNOSIS — M79603 Pain in arm, unspecified: Secondary | ICD-10-CM | POA: Diagnosis not present

## 2023-02-25 DIAGNOSIS — R079 Chest pain, unspecified: Secondary | ICD-10-CM | POA: Diagnosis not present

## 2023-02-25 DIAGNOSIS — Y92410 Unspecified street and highway as the place of occurrence of the external cause: Secondary | ICD-10-CM | POA: Diagnosis not present

## 2023-02-25 DIAGNOSIS — S59911A Unspecified injury of right forearm, initial encounter: Secondary | ICD-10-CM | POA: Diagnosis present

## 2023-02-25 DIAGNOSIS — J45909 Unspecified asthma, uncomplicated: Secondary | ICD-10-CM | POA: Diagnosis not present

## 2023-02-25 DIAGNOSIS — M542 Cervicalgia: Secondary | ICD-10-CM | POA: Insufficient documentation

## 2023-02-25 DIAGNOSIS — M545 Low back pain, unspecified: Secondary | ICD-10-CM | POA: Diagnosis not present

## 2023-02-25 DIAGNOSIS — S3993XA Unspecified injury of pelvis, initial encounter: Secondary | ICD-10-CM | POA: Diagnosis not present

## 2023-02-25 DIAGNOSIS — R0789 Other chest pain: Secondary | ICD-10-CM | POA: Diagnosis not present

## 2023-02-25 DIAGNOSIS — M79602 Pain in left arm: Secondary | ICD-10-CM | POA: Insufficient documentation

## 2023-02-25 DIAGNOSIS — S3991XA Unspecified injury of abdomen, initial encounter: Secondary | ICD-10-CM | POA: Diagnosis not present

## 2023-02-25 DIAGNOSIS — S299XXA Unspecified injury of thorax, initial encounter: Secondary | ICD-10-CM | POA: Diagnosis not present

## 2023-02-25 DIAGNOSIS — I10 Essential (primary) hypertension: Secondary | ICD-10-CM | POA: Diagnosis not present

## 2023-02-25 LAB — BASIC METABOLIC PANEL
Anion gap: 8 (ref 5–15)
BUN: 8 mg/dL (ref 6–20)
CO2: 22 mmol/L (ref 22–32)
Calcium: 8.7 mg/dL — ABNORMAL LOW (ref 8.9–10.3)
Chloride: 107 mmol/L (ref 98–111)
Creatinine, Ser: 0.67 mg/dL (ref 0.44–1.00)
GFR, Estimated: >60 mL/min (ref >60)
Glucose, Bld: 89 mg/dL (ref 70–99)
Potassium: 3.5 mmol/L (ref 3.5–5.1)
Sodium: 137 mmol/L (ref 135–145)

## 2023-02-25 LAB — CBC
HCT: 36.6 % (ref 36.0–46.0)
Hemoglobin: 12.1 g/dL (ref 12.0–15.0)
MCH: 29.2 pg (ref 26.0–34.0)
MCHC: 33.1 g/dL (ref 30.0–36.0)
MCV: 88.4 fL (ref 80.0–100.0)
Platelets: 225 10*3/uL (ref 150–400)
RBC: 4.14 MIL/uL (ref 3.87–5.11)
RDW: 12.9 % (ref 11.5–15.5)
WBC: 6.4 10*3/uL (ref 4.0–10.5)
nRBC: 0 % (ref 0.0–0.2)

## 2023-02-25 LAB — I-STAT CHEM 8, ED
BUN: 7 mg/dL (ref 6–20)
Calcium, Ion: 1.21 mmol/L (ref 1.15–1.40)
Chloride: 107 mmol/L (ref 98–111)
Creatinine, Ser: 0.6 mg/dL (ref 0.44–1.00)
Glucose, Bld: 86 mg/dL (ref 70–99)
HCT: 37 % (ref 36.0–46.0)
Hemoglobin: 12.6 g/dL (ref 12.0–15.0)
Potassium: 3.6 mmol/L (ref 3.5–5.1)
Sodium: 141 mmol/L (ref 135–145)
TCO2: 21 mmol/L — ABNORMAL LOW (ref 22–32)

## 2023-02-25 LAB — HCG, SERUM, QUALITATIVE: Preg, Serum: NEGATIVE

## 2023-02-25 MED ORDER — MORPHINE SULFATE (PF) 4 MG/ML IV SOLN
4.0000 mg | Freq: Once | INTRAVENOUS | Status: AC
  Administered 2023-02-25: 4 mg via INTRAVENOUS
  Filled 2023-02-25: qty 1

## 2023-02-25 MED ORDER — METHOCARBAMOL 500 MG PO TABS: 500.0000 mg | ORAL_TABLET | Freq: Two times a day (BID) | ORAL | 0 refills | Status: DC

## 2023-02-25 MED ORDER — HYDROCODONE-ACETAMINOPHEN 5-325 MG PO TABS: 1.0000 | ORAL_TABLET | Freq: Four times a day (QID) | ORAL | 0 refills | Status: DC | PRN

## 2023-02-25 MED ORDER — MORPHINE SULFATE (PF) 2 MG/ML IV SOLN
2.0000 mg | Freq: Once | INTRAVENOUS | Status: AC
  Administered 2023-02-25: 2 mg via INTRAVENOUS
  Filled 2023-02-25: qty 1

## 2023-02-25 MED ORDER — IOHEXOL 350 MG/ML SOLN
75.0000 mL | Freq: Once | INTRAVENOUS | Status: AC | PRN
  Administered 2023-02-25: 75 mL via INTRAVENOUS
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
## 2023-02-25 NOTE — ED Notes (Signed)
Pt needed to use the restroom and requested a bedpan. I asked her a couple of times if she was sure she wanted a bedpan. The patient after all is 27 years old. I offered to help her walk to restroom. She is ambulatory, no neuro def, or injury that would prevent her from walking. I placed the bedpan, and she then said she probably should have walked to the restroom. Side note: another young female in the room came out to say that the patient needed to pee right away. As I approached her and the room, this female kept saying the patient wanted a bedpan. This did not make sense to me so I asked for clarification a couple of times from the patient. This female made the comment regarding me not listening to her. I advised her that I needed to ask the patient, and clarify, and that I was just doing my job. She began to mouth off to me and put her hand in the air saying that I could be quiet now and that she was done talking to me. I advised her that because the patients mother is a Emergency planning/management officer I was showing some professional courtesy by allowing 3 visitors to be present and that I would ask her to leave if she continued to be disrespectful. The patient apologized for the female.
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
## 2023-02-25 NOTE — ED Notes (Signed)
C-collar removed per PA and negative CT. Pt assisted to restroom per family at the bedside.

## 2023-02-25 NOTE — Progress Notes (Signed)
Orthopedic Tech Progress Note Patient Details:  Jill Patel January 08, 1996 [id number]  Ortho Devices Type of Ortho Device: Velcro wrist splint Ortho Device/Splint Location: LUE Ortho Device/Splint Interventions: Ordered, Application   Post Interventions Patient Tolerated: Well Instructions Provided: Care of device  Donald Pore 02/25/2023, 12:50 PM
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
## 2023-03-17 DIAGNOSIS — Z13220 Encounter for screening for lipoid disorders: Secondary | ICD-10-CM | POA: Diagnosis not present

## 2023-03-17 DIAGNOSIS — Z0000 Encounter for general adult medical examination without abnormal findings: Secondary | ICD-10-CM | POA: Diagnosis not present

## 2023-05-23 DIAGNOSIS — F411 Generalized anxiety disorder: Secondary | ICD-10-CM | POA: Diagnosis not present

## 2023-05-23 DIAGNOSIS — F330 Major depressive disorder, recurrent, mild: Secondary | ICD-10-CM | POA: Diagnosis not present

## 2023-05-29 DIAGNOSIS — F411 Generalized anxiety disorder: Secondary | ICD-10-CM | POA: Diagnosis not present

## 2023-05-29 DIAGNOSIS — F330 Major depressive disorder, recurrent, mild: Secondary | ICD-10-CM | POA: Diagnosis not present

## 2023-06-06 DIAGNOSIS — F330 Major depressive disorder, recurrent, mild: Secondary | ICD-10-CM | POA: Diagnosis not present

## 2023-06-06 DIAGNOSIS — F411 Generalized anxiety disorder: Secondary | ICD-10-CM | POA: Diagnosis not present

## 2023-06-11 DIAGNOSIS — F330 Major depressive disorder, recurrent, mild: Secondary | ICD-10-CM | POA: Diagnosis not present

## 2023-06-11 DIAGNOSIS — F411 Generalized anxiety disorder: Secondary | ICD-10-CM | POA: Diagnosis not present

## 2023-06-18 DIAGNOSIS — F330 Major depressive disorder, recurrent, mild: Secondary | ICD-10-CM | POA: Diagnosis not present

## 2023-06-18 DIAGNOSIS — F411 Generalized anxiety disorder: Secondary | ICD-10-CM | POA: Diagnosis not present

## 2023-06-25 DIAGNOSIS — F411 Generalized anxiety disorder: Secondary | ICD-10-CM | POA: Diagnosis not present

## 2023-06-25 DIAGNOSIS — F330 Major depressive disorder, recurrent, mild: Secondary | ICD-10-CM | POA: Diagnosis not present

## 2023-07-02 DIAGNOSIS — F411 Generalized anxiety disorder: Secondary | ICD-10-CM | POA: Diagnosis not present

## 2023-07-02 DIAGNOSIS — F330 Major depressive disorder, recurrent, mild: Secondary | ICD-10-CM | POA: Diagnosis not present

## 2023-07-09 DIAGNOSIS — F411 Generalized anxiety disorder: Secondary | ICD-10-CM | POA: Diagnosis not present

## 2023-07-09 DIAGNOSIS — F330 Major depressive disorder, recurrent, mild: Secondary | ICD-10-CM | POA: Diagnosis not present

## 2023-07-15 DIAGNOSIS — F411 Generalized anxiety disorder: Secondary | ICD-10-CM | POA: Diagnosis not present

## 2023-07-15 DIAGNOSIS — F330 Major depressive disorder, recurrent, mild: Secondary | ICD-10-CM | POA: Diagnosis not present

## 2023-07-22 DIAGNOSIS — F330 Major depressive disorder, recurrent, mild: Secondary | ICD-10-CM | POA: Diagnosis not present

## 2023-07-22 DIAGNOSIS — F411 Generalized anxiety disorder: Secondary | ICD-10-CM | POA: Diagnosis not present

## 2023-08-01 DIAGNOSIS — F411 Generalized anxiety disorder: Secondary | ICD-10-CM | POA: Diagnosis not present

## 2023-08-01 DIAGNOSIS — F330 Major depressive disorder, recurrent, mild: Secondary | ICD-10-CM | POA: Diagnosis not present

## 2023-08-07 DIAGNOSIS — F330 Major depressive disorder, recurrent, mild: Secondary | ICD-10-CM | POA: Diagnosis not present

## 2023-08-07 DIAGNOSIS — F411 Generalized anxiety disorder: Secondary | ICD-10-CM | POA: Diagnosis not present

## 2023-08-26 DIAGNOSIS — F330 Major depressive disorder, recurrent, mild: Secondary | ICD-10-CM | POA: Diagnosis not present

## 2023-08-26 DIAGNOSIS — F411 Generalized anxiety disorder: Secondary | ICD-10-CM | POA: Diagnosis not present

## 2023-09-02 DIAGNOSIS — F330 Major depressive disorder, recurrent, mild: Secondary | ICD-10-CM | POA: Diagnosis not present

## 2023-09-02 DIAGNOSIS — F411 Generalized anxiety disorder: Secondary | ICD-10-CM | POA: Diagnosis not present

## 2023-09-10 DIAGNOSIS — F330 Major depressive disorder, recurrent, mild: Secondary | ICD-10-CM | POA: Diagnosis not present

## 2023-09-10 DIAGNOSIS — F411 Generalized anxiety disorder: Secondary | ICD-10-CM | POA: Diagnosis not present

## 2023-09-11 DIAGNOSIS — N979 Female infertility, unspecified: Secondary | ICD-10-CM | POA: Diagnosis not present

## 2023-09-11 DIAGNOSIS — R109 Unspecified abdominal pain: Secondary | ICD-10-CM | POA: Diagnosis not present

## 2023-09-16 DIAGNOSIS — F411 Generalized anxiety disorder: Secondary | ICD-10-CM | POA: Diagnosis not present

## 2023-09-16 DIAGNOSIS — F330 Major depressive disorder, recurrent, mild: Secondary | ICD-10-CM | POA: Diagnosis not present

## 2023-12-16 ENCOUNTER — Emergency Department (HOSPITAL_BASED_OUTPATIENT_CLINIC_OR_DEPARTMENT_OTHER)
Admission: EM | Admit: 2023-12-16 | Discharge: 2023-12-16 | Disposition: A | Discharge status: Home / Self Care | Attending: Emergency Medicine | Admitting: Emergency Medicine

## 2023-12-16 ENCOUNTER — Encounter (HOSPITAL_BASED_OUTPATIENT_CLINIC_OR_DEPARTMENT_OTHER): Payer: Self-pay | Admitting: Urology

## 2023-12-16 ENCOUNTER — Other Ambulatory Visit: Payer: Self-pay

## 2023-12-16 DIAGNOSIS — R109 Unspecified abdominal pain: Secondary | ICD-10-CM | POA: Diagnosis not present

## 2023-12-16 DIAGNOSIS — R197 Diarrhea, unspecified: Secondary | ICD-10-CM | POA: Insufficient documentation

## 2023-12-16 DIAGNOSIS — R112 Nausea with vomiting, unspecified: Secondary | ICD-10-CM | POA: Diagnosis not present

## 2023-12-16 DIAGNOSIS — R000 Tachycardia, unspecified: Secondary | ICD-10-CM | POA: Insufficient documentation

## 2023-12-16 LAB — CBC
HCT: 38.7 % (ref 36.0–46.0)
Hemoglobin: 12.9 g/dL (ref 12.0–15.0)
MCH: 30.1 pg (ref 26.0–34.0)
MCHC: 33.3 g/dL (ref 30.0–36.0)
MCV: 90.4 fL (ref 80.0–100.0)
Platelets: 267 10*3/uL (ref 150–400)
RBC: 4.28 MIL/uL (ref 3.87–5.11)
RDW: 12.1 % (ref 11.5–15.5)
WBC: 12.2 10*3/uL — ABNORMAL HIGH (ref 4.0–10.5)
nRBC: 0 % (ref 0.0–0.2)

## 2023-12-16 LAB — URINALYSIS, ROUTINE W REFLEX MICROSCOPIC
Bilirubin Urine: NEGATIVE
Glucose, UA: NEGATIVE mg/dL
Ketones, ur: 80 mg/dL — AB
Nitrite: NEGATIVE
Protein, ur: NEGATIVE mg/dL
Specific Gravity, Urine: 1.025 (ref 1.005–1.030)
pH: 7 (ref 5.0–8.0)

## 2023-12-16 LAB — COMPREHENSIVE METABOLIC PANEL WITH GFR
ALT: 12 U/L (ref 0–44)
AST: 23 U/L (ref 15–41)
Albumin: 4.9 g/dL (ref 3.5–5.0)
Alkaline Phosphatase: 75 U/L (ref 38–126)
Anion gap: 14 (ref 5–15)
BUN: 13 mg/dL (ref 6–20)
CO2: 23 mmol/L (ref 22–32)
Calcium: 9.4 mg/dL (ref 8.9–10.3)
Chloride: 104 mmol/L (ref 98–111)
Creatinine, Ser: 0.76 mg/dL (ref 0.44–1.00)
GFR, Estimated: >60 mL/min (ref >60)
Glucose, Bld: 94 mg/dL (ref 70–99)
Potassium: 3.7 mmol/L (ref 3.5–5.1)
Sodium: 141 mmol/L (ref 135–145)
Total Bilirubin: 0.7 mg/dL (ref 0.0–1.2)
Total Protein: 7.8 g/dL (ref 6.5–8.1)

## 2023-12-16 LAB — URINALYSIS, MICROSCOPIC (REFLEX)

## 2023-12-16 LAB — HCG, SERUM, QUALITATIVE: Preg, Serum: NEGATIVE

## 2023-12-16 LAB — LIPASE, BLOOD: Lipase: 26 U/L (ref 11–51)

## 2023-12-16 MED ORDER — SODIUM CHLORIDE 0.9 % IV BOLUS
1000.0000 mL | Freq: Once | INTRAVENOUS | Status: AC
  Administered 2023-12-16: 1000 mL via INTRAVENOUS

## 2023-12-16 MED ORDER — FENTANYL CITRATE PF 50 MCG/ML IJ SOSY
50.0000 ug | PREFILLED_SYRINGE | Freq: Once | INTRAMUSCULAR | Status: DC
  Filled 2023-12-16: qty 1

## 2023-12-16 MED ORDER — ONDANSETRON HCL 4 MG/2ML IJ SOLN
4.0000 mg | Freq: Once | INTRAMUSCULAR | Status: AC
  Administered 2023-12-16: 4 mg via INTRAVENOUS
  Filled 2023-12-16: qty 2

## 2023-12-16 MED ORDER — ONDANSETRON HCL 4 MG PO TABS: 4.0000 mg | ORAL_TABLET | Freq: Four times a day (QID) | ORAL | 0 refills | Status: DC

## 2023-12-16 MED ORDER — KETOROLAC TROMETHAMINE 30 MG/ML IJ SOLN
15.0000 mg | Freq: Once | INTRAMUSCULAR | Status: AC
  Administered 2023-12-16: 15 mg via INTRAVENOUS
  Filled 2023-12-16: qty 1

## 2023-12-16 NOTE — ED Provider Notes (Signed)
 [place] [facility] AT [facility] Provider Note   CSN: [id number] Arrival date & time: 12/16/23  1122     History  Chief Complaint  Patient presents with   Emesis    Jill Patel is a 28 y.o. female.  Patient complaining of left sided abdominal discomfort, cramp-like, onset last night. Associated nausea, vomiting, and diarrhea. Last diarrheal episode around 1100 today. Last vomiting just prior to arrival in ED. She reports eating seafood last night that tasted unusual.   The history is provided by the patient. No language interpreter was used.  Emesis Severity:  Moderate Duration:  12 hours Timing:  Intermittent Associated symptoms: abdominal pain and chills   Associated symptoms: no fever   Risk factors: suspect food intake        Home Medications Prior to Admission medications   Medication Sig Start Date End Date Taking? Authorizing Provider  HYDROcodone -acetaminophen  (NORCO/VICODIN) 5-325 MG tablet Take 1 tablet by mouth every 6 (six) hours as needed for severe pain. 02/25/23   Roemhildt, Lorin T, PA-C  methocarbamol  (ROBAXIN ) 500 MG tablet Take 1 tablet (500 mg total) by mouth 2 (two) times daily. 02/25/23   Roemhildt, Lorin T, PA-C  Prenatal Vit-Fe Fumarate-FA (PRENATAL PO) Take 1 tablet by mouth daily.    [provider]      Allergies    Kiwi extract, Aleve [naproxen sodium], Food, Mango flavoring agent (non-screening), and Pineapple    Review of Systems   Review of Systems  Constitutional:  Positive for chills. Negative for fever.  Gastrointestinal:  Positive for abdominal pain and vomiting.  All other systems reviewed and are negative.   Physical Exam Updated Vital Signs BP 115/75   Pulse (!) 102   Temp 97.8 F (36.6 C)   Resp 18   Ht 5\' 3"  (1.6 m)   Wt 49.4 kg   LMP 12/05/2023   SpO2 97%   BMI 19.29 kg/m  Physical Exam Constitutional:      Appearance: Normal appearance.  HENT:     Head: Normocephalic.      Mouth/Throat:     Mouth: Mucous membranes are moist.  Eyes:     Conjunctiva/sclera: Conjunctivae normal.  Cardiovascular:     Rate and Rhythm: Tachycardia present.  Pulmonary:     Effort: Pulmonary effort is normal.     Breath sounds: Normal breath sounds.  Abdominal:     General: Bowel sounds are normal. There is no distension.     Palpations: Abdomen is soft.     Tenderness: There is no abdominal tenderness.  Musculoskeletal:        General: Normal range of motion.  Skin:    General: Skin is warm and dry.  Neurological:     Mental Status: She is alert and oriented to person, place, and time.  Psychiatric:        Mood and Affect: Mood normal.        Behavior: Behavior normal.     ED Results / Procedures / Treatments   Labs (all labs ordered are listed, but only abnormal results are displayed) Labs Reviewed  CBC - Abnormal; Notable for the following components:      Result Value   WBC 12.2 (*)    All other components within normal limits  URINALYSIS, ROUTINE W REFLEX MICROSCOPIC - Abnormal; Notable for the following components:   Hgb urine dipstick TRACE (*)    Ketones, ur 80 (*)    Leukocytes,Ua TRACE (*)  All other components within normal limits  URINALYSIS, MICROSCOPIC (REFLEX) - Abnormal; Notable for the following components:   Bacteria, UA FEW (*)    All other components within normal limits  LIPASE, BLOOD  COMPREHENSIVE METABOLIC PANEL WITH GFR  HCG, SERUM, QUALITATIVE    EKG None  Radiology No results found.  Procedures Procedures    Medications Ordered in ED Medications  sodium chloride  0.9 % bolus 1,000 mL (1,000 mLs Intravenous New Bag/Given 12/16/23 1319)  ondansetron  (ZOFRAN ) injection 4 mg (4 mg Intravenous Given 12/16/23 1319)  ketorolac  (TORADOL ) 30 MG/ML injection 15 mg (15 mg Intravenous Given 12/16/23 1327)    ED Course/ Medical Decision Making/ A&P  Patient feels better after IV fluids and medication. Currently tolerating oral intake.                                 Medical Decision Making Amount and/or Complexity of Data Reviewed Labs: ordered.  Risk Prescription drug management.   Patient with symptoms consistent with gastroenteritis.  Vitals are stable, no fever.  IV fluids and antiemetic given in ED. Currently tolerating PO fluids > 6 oz.  Lungs are clear.  No focal abdominal pain, no concern for appendicitis, cholecystitis, pancreatitis, ruptured viscus, UTI, kidney stone, or any other abdominal etiology.  Supportive therapy indicated with return if symptoms worsen.  Patient counseled.         Final Clinical Impression(s) / ED Diagnoses Final diagnoses:  Nausea vomiting and diarrhea    Rx / DC Orders ED Discharge Orders          Ordered    ondansetron  (ZOFRAN ) 4 MG tablet  Every 6 hours        12/16/23 1435              Gigi Kyle, NP 12/16/23 1445    Roberts Ching, MD 12/17/23 [phone]

## 2023-12-16 NOTE — ED Triage Notes (Signed)
 Pt states left side upper abd pain and emesis that started last night, not tolerating po intake  Also states diarrhea that started this am   Had some seafood last night prior to emesis  Also states LMP only lasted 1 day, possibility of pregnancy

## 2023-12-16 NOTE — Discharge Instructions (Addendum)
 Please refer to the attached instructions. Follow-up with your primary care provider as needed.

## 2023-12-19 DIAGNOSIS — Z113 Encounter for screening for infections with a predominantly sexual mode of transmission: Secondary | ICD-10-CM | POA: Diagnosis not present

## 2023-12-19 DIAGNOSIS — N939 Abnormal uterine and vaginal bleeding, unspecified: Secondary | ICD-10-CM | POA: Diagnosis not present

## 2023-12-19 DIAGNOSIS — R109 Unspecified abdominal pain: Secondary | ICD-10-CM | POA: Diagnosis not present

## 2023-12-29 ENCOUNTER — Other Ambulatory Visit (HOSPITAL_COMMUNITY)
Admission: RE | Admit: 2023-12-29 | Discharge: 2023-12-29 | Disposition: A | Discharge status: Home / Self Care | Source: Ambulatory Visit | Attending: Obstetrics | Admitting: Obstetrics

## 2023-12-29 ENCOUNTER — Ambulatory Visit: Admitting: Obstetrics

## 2023-12-29 ENCOUNTER — Encounter: Payer: Self-pay | Admitting: Obstetrics

## 2023-12-29 VITALS — BP 108/66 | HR 66 | Ht 63.0 in | Wt 120.2 lb

## 2023-12-29 DIAGNOSIS — Z3009 Encounter for other general counseling and advice on contraception: Secondary | ICD-10-CM | POA: Diagnosis not present

## 2023-12-29 DIAGNOSIS — Z30011 Encounter for initial prescription of contraceptive pills: Secondary | ICD-10-CM

## 2023-12-29 DIAGNOSIS — N898 Other specified noninflammatory disorders of vagina: Secondary | ICD-10-CM | POA: Insufficient documentation

## 2023-12-29 DIAGNOSIS — R102 Pelvic and perineal pain: Secondary | ICD-10-CM | POA: Diagnosis not present

## 2023-12-29 DIAGNOSIS — Z113 Encounter for screening for infections with a predominantly sexual mode of transmission: Secondary | ICD-10-CM

## 2023-12-29 DIAGNOSIS — N939 Abnormal uterine and vaginal bleeding, unspecified: Secondary | ICD-10-CM | POA: Diagnosis not present

## 2023-12-29 MED ORDER — NORGESTIMATE-ETH ESTRADIOL 0.25-35 MG-MCG PO TABS: 1.0000 | ORAL_TABLET | Freq: Every day | ORAL | 11 refills | Status: DC

## 2023-12-29 MED ORDER — VITAFOL ULTRA 29-0.6-0.4-200 MG PO CAPS
1.0000 | ORAL_CAPSULE | Freq: Every day | ORAL | 4 refills | Status: AC
Start: 2023-12-29 — End: ?

## 2023-12-29 NOTE — Progress Notes (Unsigned)
 Patient ID: Jill Patel, female   DOB: 1995/10/26, 28 y.o.   MRN: [id number]  Chief Complaint  Patient presents with   Menstrual Problem    HPI Jill Patel is a 28 y.o. female.  Complains of irregular menstrual bleeding, pelvic pain and vaginal discharge since for the past 3 months.  Contraception:  None.  LPS 02/19/2023.  PMH significant for kidney stones. HPI  Past Medical History:  Diagnosis Date   Anemia    Anxiety    Asthma    Depression    never on meds   Headache    Heart murmur    at birth   History of kidney stones    Kidney cysts    Ovarian cyst    Scoliosis     Past Surgical History:  Procedure Laterality Date   CYSTOSCOPY W/ URETERAL STENT PLACEMENT Left 10/01/2022   Procedure: CYSTOSCOPY WITH RETROGRADE PYELOGRAM/URETERAL STENT PLACEMENT;  Surgeon: Andrez Banker, MD;  Location: [facility];  Service: Urology;  Laterality: Left;   CYSTOSCOPY WITH STENT PLACEMENT Left 10/31/2022   Procedure: CYSTOSCOPY WITH LEFT STENT EXCHANGE;  Surgeon: Andrez Banker, MD;  Location: [facility];  Service: Urology;  Laterality: Left;   CYSTOSCOPY/URETEROSCOPY/HOLMIUM LASER/STENT PLACEMENT Left 11/07/2022   Procedure: CYSTOSCOPY, LEFT URETEROSCOPY, HOLMIUM LASER LITHOTRIPSY, STONE EXTRACTION, LEFT RETROGRADE PYLEOGRAM AND LEFT URETERAL STENT EXCHANGE;  Surgeon: Andrez Banker, MD;  Location: [facility];  Service: Urology;  Laterality: Left;   TONSILLECTOMY      Family History  Problem Relation Age of Onset   Healthy Mother    Healthy Father    Diabetes Maternal Grandfather    Heart disease Maternal Grandfather    Cancer Maternal Grandfather     Social History Social History   Tobacco Use   Smoking status: Former    Types: Cigars    Passive exposure: Never   Smokeless tobacco: Never   Tobacco comments:    Black and mild (2022) and vapes (quit with preg)  Vaping Use   Vaping status: Former   Substances: Nicotine, Flavoring  Substance  Use Topics   Alcohol use: Not Currently    Comment: rarely   Drug use: No    Allergies  Allergen Reactions   Kiwi Extract Anaphylaxis   Aleve [Naproxen Sodium] Other (See Comments)    Stomach bleeds   Food Other (See Comments)    Eggplant - scratchy throat   Mango Flavoring Agent (Non-Screening) Other (See Comments)    Scratchy throat   Pineapple Other (See Comments)    Scratchy throat     Current Outpatient Medications  Medication Sig Dispense Refill   ondansetron  (ZOFRAN ) 4 MG tablet Take 1 tablet (4 mg total) by mouth every 6 (six) hours. 12 tablet 0   HYDROcodone -acetaminophen  (NORCO/VICODIN) 5-325 MG tablet Take 1 tablet by mouth every 6 (six) hours as needed for severe pain. (Patient not taking: Reported on 12/29/2023) 6 tablet 0   methocarbamol  (ROBAXIN ) 500 MG tablet Take 1 tablet (500 mg total) by mouth 2 (two) times daily. (Patient not taking: Reported on 12/29/2023) 20 tablet 0   Prenatal Vit-Fe Fumarate-FA (PRENATAL PO) Take 1 tablet by mouth daily. (Patient not taking: Reported on 12/29/2023)     No current facility-administered medications for this visit.    Review of Systems Review of Systems Constitutional: negative for fatigue and weight loss Respiratory: negative for cough and wheezing Cardiovascular: negative for chest pain, fatigue and palpitations Gastrointestinal: negative for abdominal pain and  change in bowel habits Genitourinary: positive for irregular menstrual bleeding, pelvic pain and vaginal discharge Integument/breast: negative for nipple discharge Musculoskeletal:negative for myalgias Neurological: negative for gait problems and tremors Behavioral/Psych: negative for abusive relationship, depression Endocrine: negative for temperature intolerance      Blood pressure 108/66, pulse 66, height 5\' 3"  (1.6 m), weight 120 lb 3.2 oz (54.5 kg), last menstrual period 12/19/2023, currently breastfeeding.  Physical Exam Physical Exam General:   Alert and no  distress  Skin:   no rash or abnormalities  Lungs:   clear to auscultation bilaterally  Heart:   regular rate and rhythm, S1, S2 normal, no murmur, click, rub or gallop  Breasts:   Not examined  Abdomen:  normal findings: no organomegaly, soft, non-tender and no hernia  Pelvis:  External genitalia: normal general appearance Urinary system: urethral meatus normal and bladder without fullness, nontender Vaginal: normal without tenderness, induration or masses Cervix: normal appearance Adnexa: normal bimanual exam Uterus: anteverted and non-tender, normal size    I have spent a total of 20 minutes of face-to-face time, excluding clinical staff time, reviewing notes and preparing to see patient, ordering tests and/or medications, and counseling the patient.   Data Reviewed Wet Prep and Cultures Pap Smear  Assessment     1. Abnormal uterine bleeding (AUB) (Primary) - anovulatory cycles.  Needs cycle regulation - options discussed for cycle regulation, and OCP's recommended Rx: - Ferritin  2. Pelvic pain Rx: - US  PELVIC COMPLETE WITH TRANSVAGINAL; Future  3. Vaginal discharge Rx: - Cervicovaginal ancillary only( Velva)  4. Screen for STD (sexually transmitted disease) Rx: - HIV antibody (with reflex) - RPR - Hepatitis C Antibody - Hepatitis B Surface AntiGEN  5. Encounter for counseling regarding contraception - options discussed - wants OCP's  6. Encounter for initial prescription of contraceptive pills Rx: - norgestimate-ethinyl estradiol (ORTHO-CYCLEN) 0.25-35 MG-MCG tablet; Take 1 tablet by mouth daily.  Dispense: 28 tablet; Refill: 11     Plan    Orders Placed This Encounter  Procedures   HIV antibody (with reflex)   RPR   Hepatitis C Antibody   Hepatitis B Surface AntiGEN   No orders of the defined types were placed in this encounter.   Need to obtain previous records Possible management options include:*** Follow up as needed.  ***          Jill Joiner, MD, FACOG Attending Obstetrician & Gynecologist, [facility] for [facility], [facility], [place] 12/29/2023

## 2023-12-29 NOTE — Progress Notes (Unsigned)
 Pt presents for irregular periods. Pt states that she had spotting April 20-29, May 16/17, May 30/31

## 2023-12-30 ENCOUNTER — Ambulatory Visit: Payer: Self-pay | Admitting: Obstetrics

## 2023-12-30 DIAGNOSIS — B9689 Other specified bacterial agents as the cause of diseases classified elsewhere: Secondary | ICD-10-CM

## 2023-12-30 LAB — CERVICOVAGINAL ANCILLARY ONLY
Bacterial Vaginitis (gardnerella): POSITIVE — AB
Candida Glabrata: NEGATIVE
Candida Vaginitis: NEGATIVE
Chlamydia: NEGATIVE
Comment: NEGATIVE
Comment: NEGATIVE
Comment: NEGATIVE
Comment: NEGATIVE
Comment: NEGATIVE
Comment: NORMAL
Neisseria Gonorrhea: NEGATIVE
Trichomonas: NEGATIVE

## 2023-12-30 LAB — RPR: RPR Ser Ql: NONREACTIVE

## 2023-12-30 LAB — HEPATITIS C ANTIBODY: Hep C Virus Ab: NONREACTIVE

## 2023-12-30 LAB — FERRITIN: Ferritin: 28 ng/mL (ref 15–150)

## 2023-12-30 LAB — HIV ANTIBODY (ROUTINE TESTING W REFLEX): HIV Screen 4th Generation wRfx: NONREACTIVE

## 2023-12-30 LAB — HEPATITIS B SURFACE ANTIGEN: Hepatitis B Surface Ag: NEGATIVE

## 2023-12-30 MED ORDER — METRONIDAZOLE 500 MG PO TABS: 500.0000 mg | ORAL_TABLET | Freq: Two times a day (BID) | ORAL | 2 refills | Status: DC

## 2024-01-01 ENCOUNTER — Other Ambulatory Visit: Payer: Self-pay

## 2024-01-01 ENCOUNTER — Other Ambulatory Visit: Payer: Self-pay | Admitting: Obstetrics

## 2024-01-01 DIAGNOSIS — N760 Acute vaginitis: Secondary | ICD-10-CM

## 2024-01-01 MED ORDER — METRONIDAZOLE 0.75 % VA GEL: 1.0000 | Freq: Two times a day (BID) | VAGINAL | 0 refills | Status: DC

## 2024-01-01 MED ORDER — METRONIDAZOLE 0.75 % VA GEL
1.0000 | Freq: Two times a day (BID) | VAGINAL | 5 refills | Status: DC
Start: 2024-01-01 — End: 2024-03-23

## 2024-01-02 ENCOUNTER — Ambulatory Visit (HOSPITAL_BASED_OUTPATIENT_CLINIC_OR_DEPARTMENT_OTHER)
Admission: RE | Admit: 2024-01-02 | Discharge: 2024-01-02 | Disposition: A | Discharge status: Home / Self Care | Source: Ambulatory Visit | Attending: Obstetrics | Admitting: Obstetrics

## 2024-01-02 DIAGNOSIS — R102 Pelvic and perineal pain: Secondary | ICD-10-CM | POA: Diagnosis present

## 2024-01-02 DIAGNOSIS — R1032 Left lower quadrant pain: Secondary | ICD-10-CM | POA: Diagnosis not present

## 2024-01-07 ENCOUNTER — Emergency Department (HOSPITAL_BASED_OUTPATIENT_CLINIC_OR_DEPARTMENT_OTHER)

## 2024-01-07 ENCOUNTER — Other Ambulatory Visit: Payer: Self-pay

## 2024-01-07 ENCOUNTER — Encounter (HOSPITAL_BASED_OUTPATIENT_CLINIC_OR_DEPARTMENT_OTHER): Payer: Self-pay

## 2024-01-07 ENCOUNTER — Emergency Department (HOSPITAL_BASED_OUTPATIENT_CLINIC_OR_DEPARTMENT_OTHER)
Admission: EM | Admit: 2024-01-07 | Discharge: 2024-01-07 | Disposition: A | Discharge status: Home / Self Care | Attending: Emergency Medicine | Admitting: Emergency Medicine

## 2024-01-07 DIAGNOSIS — N200 Calculus of kidney: Secondary | ICD-10-CM | POA: Diagnosis not present

## 2024-01-07 DIAGNOSIS — R1032 Left lower quadrant pain: Secondary | ICD-10-CM | POA: Diagnosis not present

## 2024-01-07 DIAGNOSIS — R109 Unspecified abdominal pain: Secondary | ICD-10-CM

## 2024-01-07 DIAGNOSIS — F17210 Nicotine dependence, cigarettes, uncomplicated: Secondary | ICD-10-CM | POA: Insufficient documentation

## 2024-01-07 DIAGNOSIS — K769 Liver disease, unspecified: Secondary | ICD-10-CM | POA: Diagnosis not present

## 2024-01-07 DIAGNOSIS — R509 Fever, unspecified: Secondary | ICD-10-CM | POA: Diagnosis not present

## 2024-01-07 DIAGNOSIS — R42 Dizziness and giddiness: Secondary | ICD-10-CM | POA: Diagnosis not present

## 2024-01-07 DIAGNOSIS — R9431 Abnormal electrocardiogram [ECG] [EKG]: Secondary | ICD-10-CM | POA: Diagnosis not present

## 2024-01-07 LAB — COMPREHENSIVE METABOLIC PANEL WITH GFR
ALT: 12 U/L (ref 0–44)
AST: 21 U/L (ref 15–41)
Albumin: 4.5 g/dL (ref 3.5–5.0)
Alkaline Phosphatase: 51 U/L (ref 38–126)
Anion gap: 12 (ref 5–15)
BUN: 11 mg/dL (ref 6–20)
CO2: 23 mmol/L (ref 22–32)
Calcium: 9.2 mg/dL (ref 8.9–10.3)
Chloride: 104 mmol/L (ref 98–111)
Creatinine, Ser: 0.8 mg/dL (ref 0.44–1.00)
GFR, Estimated: >60 mL/min (ref >60)
Glucose, Bld: 89 mg/dL (ref 70–99)
Potassium: 4.1 mmol/L (ref 3.5–5.1)
Sodium: 138 mmol/L (ref 135–145)
Total Bilirubin: 0.5 mg/dL (ref 0.0–1.2)
Total Protein: 7 g/dL (ref 6.5–8.1)

## 2024-01-07 LAB — CBC
HCT: 35.7 % — ABNORMAL LOW (ref 36.0–46.0)
Hemoglobin: 11.9 g/dL — ABNORMAL LOW (ref 12.0–15.0)
MCH: 29.8 pg (ref 26.0–34.0)
MCHC: 33.3 g/dL (ref 30.0–36.0)
MCV: 89.5 fL (ref 80.0–100.0)
Platelets: 278 10*3/uL (ref 150–400)
RBC: 3.99 MIL/uL (ref 3.87–5.11)
RDW: 12.5 % (ref 11.5–15.5)
WBC: 7 10*3/uL (ref 4.0–10.5)
nRBC: 0 % (ref 0.0–0.2)

## 2024-01-07 LAB — URINALYSIS, MICROSCOPIC (REFLEX)

## 2024-01-07 LAB — URINALYSIS, ROUTINE W REFLEX MICROSCOPIC
Bilirubin Urine: NEGATIVE
Glucose, UA: NEGATIVE mg/dL
Ketones, ur: 40 mg/dL — AB
Leukocytes,Ua: NEGATIVE
Nitrite: NEGATIVE
Protein, ur: NEGATIVE mg/dL
Specific Gravity, Urine: 1.02 (ref 1.005–1.030)
pH: 6.5 (ref 5.0–8.0)

## 2024-01-07 LAB — HCG, SERUM, QUALITATIVE: Preg, Serum: NEGATIVE

## 2024-01-07 MED ORDER — PROMETHAZINE HCL 25 MG RE SUPP: 25.0000 mg | Freq: Four times a day (QID) | RECTAL | 0 refills | Status: DC | PRN

## 2024-01-07 MED ORDER — LORAZEPAM 2 MG/ML IJ SOLN
1.0000 mg | Freq: Once | INTRAMUSCULAR | Status: AC
  Administered 2024-01-07: 1 mg via INTRAVENOUS
  Filled 2024-01-07: qty 1

## 2024-01-07 MED ORDER — CELECOXIB 200 MG PO CAPS: 200.0000 mg | ORAL_CAPSULE | Freq: Two times a day (BID) | ORAL | 0 refills | Status: AC | PRN

## 2024-01-07 MED ORDER — SODIUM CHLORIDE 0.9 % IV BOLUS
1000.0000 mL | Freq: Once | INTRAVENOUS | Status: AC
  Administered 2024-01-07: 1000 mL via INTRAVENOUS

## 2024-01-07 MED ORDER — POLYETHYLENE GLYCOL 3350 17 G PO PACK: 17.0000 g | PACK | Freq: Every day | ORAL | 0 refills | Status: AC

## 2024-01-07 MED ORDER — FENTANYL CITRATE PF 50 MCG/ML IJ SOSY
25.0000 ug | PREFILLED_SYRINGE | Freq: Once | INTRAMUSCULAR | Status: AC
  Administered 2024-01-07: 25 ug via INTRAVENOUS
  Filled 2024-01-07: qty 1

## 2024-01-07 MED ORDER — KETOROLAC TROMETHAMINE 30 MG/ML IJ SOLN
30.0000 mg | Freq: Once | INTRAMUSCULAR | Status: AC
  Administered 2024-01-07: 30 mg via INTRAVENOUS
  Filled 2024-01-07: qty 1

## 2024-01-07 MED ORDER — MORPHINE SULFATE (PF) 2 MG/ML IV SOLN
2.0000 mg | Freq: Once | INTRAVENOUS | Status: AC
  Administered 2024-01-07: 2 mg via INTRAVENOUS
  Filled 2024-01-07: qty 1

## 2024-01-07 MED ORDER — CEFADROXIL 500 MG PO CAPS: 500.0000 mg | ORAL_CAPSULE | Freq: Two times a day (BID) | ORAL | 0 refills | Status: DC

## 2024-01-07 MED ORDER — ONDANSETRON HCL 4 MG/2ML IJ SOLN
4.0000 mg | Freq: Once | INTRAMUSCULAR | Status: AC
  Administered 2024-01-07: 4 mg via INTRAVENOUS
  Filled 2024-01-07: qty 2

## 2024-01-07 NOTE — ED Triage Notes (Signed)
 C/o vomiting since last night, having left sided abdominal/flank pain, noted blood clots in her underwear/toilet when she urinates. Hx of kidney stones, feels similar. Fever of 101 last night. Also having dizziness today.

## 2024-01-07 NOTE — Discharge Instructions (Addendum)
 You have some bacteria in your urine so we will place you on antibiotics for this.  You do not have any obstructing stone causing your symptoms.  You have a moderate amount of stool in your colon which could be causing your symptoms.  Will send you home with MiraLAX  to use to help relieve constipation; recommend getting with 2-4 packets and increasing as needed to help with bowel movements or decreasing if you develop diarrhea.  Will also send in medicine for your abdominal discomfort as well as nausea.  Please not hesitate to return to the emergency department if the worrisome signs and symptoms we discussed become apparent.

## 2024-01-07 NOTE — ED Provider Notes (Signed)
 [place] [facility] Provider Note   CSN: [id number] Arrival date & time: 01/07/24  1016     Patient presents with: Flank Pain and Dizziness   Jill Patel is a 28 y.o. female.    Flank Pain  Dizziness   28 year old female presents emergency department complaints of left-sided flank pain, vomiting.  States that she began with symptoms yesterday evening.  States that he did notice bleeding in her underwear/toilet when she urinated but is unaware whether this was vaginal or from her urine.  States that she has not noticed it since 1 occurrence yesterday.  Does report history of kidney stones and the pain distribution feels similar to other kidney stone she has had in the past.  States that she had subjective fever last night.  States that she felt somewhat lightheaded ever since having episodes of vomiting.  Past medical history significant for kidney stone, pyelonephritis, anemia, cytopenia, prolonged QTc  Prior to Admission medications   Medication Sig Start Date End Date Taking? Authorizing Provider  HYDROcodone -acetaminophen  (NORCO/VICODIN) 5-325 MG tablet Take 1 tablet by mouth every 6 (six) hours as needed for severe pain. Patient not taking: Reported on 12/29/2023 02/25/23   Roemhildt, Lorin T, PA-C  methocarbamol  (ROBAXIN ) 500 MG tablet Take 1 tablet (500 mg total) by mouth 2 (two) times daily. Patient not taking: Reported on 12/29/2023 02/25/23   Roemhildt, Lorin T, PA-C  metroNIDAZOLE  (METROGEL ) 0.75 % vaginal gel Place 1 Applicatorful vaginally 2 (two) times daily. Use monthly after the period for 6 months. 01/01/24   Rudy Carlin LABOR, MD  norgestimate -ethinyl estradiol  (ORTHO-CYCLEN) 0.25-35 MG-MCG tablet Take 1 tablet by mouth daily. 12/29/23   Rudy Carlin LABOR, MD  ondansetron  (ZOFRAN ) 4 MG tablet Take 1 tablet (4 mg total) by mouth every 6 (six) hours. 12/16/23   Claudene Lenis, NP  Prenat-Fe Poly-Methfol-FA-DHA (VITAFOL  ULTRA)  29-0.6-0.4-200 MG CAPS Take 1 capsule by mouth daily before breakfast. 12/29/23   Rudy Carlin LABOR, MD    Allergies: Kiwi extract, Aleve [naproxen sodium], Food, Mango flavoring agent (non-screening), and Pineapple    Review of Systems  Genitourinary:  Positive for flank pain.  Neurological:  Positive for dizziness.  All other systems reviewed and are negative.   Updated Vital Signs BP (!) 121/53 (BP Location: Right Arm)   Pulse 64   Temp 98.3 F (36.8 C) (Oral)   Resp 16   Ht 5' 3 (1.6 m)   Wt 54.4 kg   LMP 11/08/2023 (Exact Date)   SpO2 100%   Breastfeeding No   BMI 21.26 kg/m   Physical Exam Vitals and nursing note reviewed.  Constitutional:      General: She is not in acute distress.    Appearance: She is well-developed.  HENT:     Head: Normocephalic and atraumatic.   Eyes:     Conjunctiva/sclera: Conjunctivae normal.    Cardiovascular:     Rate and Rhythm: Normal rate and regular rhythm.     Heart sounds: No murmur heard. Pulmonary:     Effort: Pulmonary effort is normal. No respiratory distress.     Breath sounds: Normal breath sounds.  Abdominal:     Palpations: Abdomen is soft.     Tenderness: There is abdominal tenderness. There is left CVA tenderness.     Comments: Left lower quadrant abdominal tenderness.   Musculoskeletal:        General: No swelling.     Cervical back: Neck supple.   Skin:  General: Skin is warm and dry.     Capillary Refill: Capillary refill takes less than 2 seconds.   Neurological:     Mental Status: She is alert.   Psychiatric:        Mood and Affect: Mood normal.     (all labs ordered are listed, but only abnormal results are displayed) Labs Reviewed  CBC - Abnormal; Notable for the following components:      Result Value   Hemoglobin 11.9 (*)    HCT 35.7 (*)    All other components within normal limits  COMPREHENSIVE METABOLIC PANEL WITH GFR  PREGNANCY, URINE  URINALYSIS, ROUTINE W REFLEX MICROSCOPIC     EKG: None  Radiology: No results found.   Procedures   Medications Ordered in the ED  sodium chloride  0.9 % bolus 1,000 mL (1,000 mLs Intravenous New Bag/Given 01/07/24 1118)  ondansetron  (ZOFRAN ) injection 4 mg (4 mg Intravenous Given 01/07/24 1120)  morphine  (PF) 2 MG/ML injection 2 mg (2 mg Intravenous Given 01/07/24 1121)                                    Medical Decision Making Amount and/or Complexity of Data Reviewed Labs: ordered. Radiology: ordered.  Risk OTC drugs. Prescription drug management.   This patient presents to the ED for concern of left flank pain, this involves an extensive number of treatment options, and is a complaint that carries with it a high risk of complications and morbidity.  The differential diagnosis includes pyonephritis, nephrolithiasis, diverticulitis, ectopic pregnancy, ovarian torsion, SBO/LBO, volvulus, other   Co morbidities that complicate the patient evaluation  See HPI   Additional history obtained:  Additional history obtained from EMR External records from outside source obtained and reviewed including hospital records   Lab Tests:  I Ordered, and personally interpreted labs.  The pertinent results include: No leukocytes.  Anemia hemoglobin of 11.9.  Platelets within range.  UA rare bacteria, 6-10 RBCs with negative nitrates/leukocytes.  UA also with 40 ketones, moderate hemoglobin.  No leukocytosis.  Anemia hemoglobin 11.9 which is near baseline.  Platelets within normal range.  hCG negative.   Imaging Studies ordered:  I ordered imaging studies including CT renal stone study I independently visualized and interpreted imaging which showed punctate nonobstructing nephrolithiasis.  Moderate amount of stool in colon. I agree with the radiologist interpretation   Cardiac Monitoring: / EKG:  The patient was maintained on a cardiac monitor.  I personally viewed and interpreted the cardiac monitored which showed an  underlying rhythm of: Sinus rhythm.  QTc 452.  Nonspecific T wave changes.   Consultations Obtained:  N/a   Problem List / ED Course / Critical interventions / Medication management  Left flank pain I ordered medication including Zofran , morphine , normal saline, fentanyl , Toradol , Ativan    Reevaluation of the patient after these medicines showed that the patient improved I have reviewed the patients home medicines and have made adjustments as needed   Social Determinants of Health:  Cigar use.  Denies illicit drug use.   Test / Admission - Considered:  Left flank pain Vitals signs within normal range and stable throughout visit. Laboratory/imaging studies significant for: See above 28 year old female presents emergency department complaints of left-sided flank pain, vomiting.  States that she began with symptoms yesterday evening.  States that she did notice bleeding in her underwear/toilet when she urinated but is unaware whether this was  vaginal or from her urine.  States that she has not noticed it since 1 occurrence yesterday.  Does report history of kidney stones and the pain distribution feels similar to other kidney stone she has had in the past.  States that she had subjective fever last night.  States that she felt somewhat lightheaded ever since having episodes of vomiting. On exam, patient with reproducible tenderness left upper quadrant of the abdomen as well as left-sided CVA tenderness.  Workup today reassuring. CT scan did show evidence of punctate stones but no obstructing urolithiasis with moderate stool in the colon.  UA with possible infection with bacteria RBCs present in the setting of urinary symptoms.  Will treat with antibiotics empirically given concern for possible UTI with development of pyelonephritis, left-sided flank pain.  Will additionally treat patient's constipation with MiraLAX  titrated to effect.  Will recommend follow-up with PCP in the outpatient  setting for reassessment of symptoms.  Treatment plan discussed with patient and she denies understanding was agreeable to start them.  Patient well-appearing, afebrile in no acute distress. Worrisome signs and symptoms were discussed with the patient, and the patient acknowledged understanding to return to the ED if noticed. Patient was stable upon discharge.       Final diagnoses:  None    ED Discharge Orders     None          Silver Wonda LABOR, [place] 01/07/24 1622    Darra Fonda MATSU, MD 01/16/24 1806

## 2024-01-13 ENCOUNTER — Telehealth: Admitting: Obstetrics

## 2024-02-20 DIAGNOSIS — M25512 Pain in left shoulder: Secondary | ICD-10-CM | POA: Diagnosis not present

## 2024-02-20 DIAGNOSIS — N644 Mastodynia: Secondary | ICD-10-CM | POA: Diagnosis not present

## 2024-02-20 DIAGNOSIS — G8929 Other chronic pain: Secondary | ICD-10-CM | POA: Diagnosis not present
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
## 2024-02-25 ENCOUNTER — Encounter: Payer: Self-pay | Admitting: Obstetrics

## 2024-02-25 ENCOUNTER — Ambulatory Visit (INDEPENDENT_AMBULATORY_CARE_PROVIDER_SITE_OTHER): Admitting: Obstetrics

## 2024-02-25 VITALS — BP 115/69 | HR 67 | Ht 63.0 in | Wt 117.5 lb

## 2024-02-25 DIAGNOSIS — N644 Mastodynia: Secondary | ICD-10-CM | POA: Diagnosis not present

## 2024-02-25 NOTE — Progress Notes (Unsigned)
 Pt presents for bc/fu. Pt states that she does not want to be on bc at this time. Pt is having pain in her left breast. Pt is currently trying to conceive.

## 2024-02-25 NOTE — Progress Notes (Unsigned)
 Patient ID: Jill Patel, female   DOB: 11/18/1995, 28 y.o.   MRN: [id number]  Chief Complaint  Patient presents with   Follow-up    HPI Jill Patel is a 28 y.o. female.  Left breast pain for ~ 1 month.  Pain is worse when on period.  Denies palpating any masses. HPI  Past Medical History:  Diagnosis Date   Anemia    Anxiety    Asthma    Depression    never on meds   Headache    Heart murmur    at birth   History of kidney stones    Kidney cysts    Ovarian cyst    Scoliosis     Past Surgical History:  Procedure Laterality Date   CYSTOSCOPY W/ URETERAL STENT PLACEMENT Left 10/01/2022   Procedure: CYSTOSCOPY WITH RETROGRADE PYELOGRAM/URETERAL STENT PLACEMENT;  Surgeon: Cam Morene ORN, MD;  Location: [facility];  Service: Urology;  Laterality: Left;   CYSTOSCOPY WITH STENT PLACEMENT Left 10/31/2022   Procedure: CYSTOSCOPY WITH LEFT STENT EXCHANGE;  Surgeon: Cam Morene ORN, MD;  Location: [facility];  Service: Urology;  Laterality: Left;   CYSTOSCOPY/URETEROSCOPY/HOLMIUM LASER/STENT PLACEMENT Left 11/07/2022   Procedure: CYSTOSCOPY, LEFT URETEROSCOPY, HOLMIUM LASER LITHOTRIPSY, STONE EXTRACTION, LEFT RETROGRADE PYLEOGRAM AND LEFT URETERAL STENT EXCHANGE;  Surgeon: Cam Morene ORN, MD;  Location: [facility];  Service: Urology;  Laterality: Left;   TONSILLECTOMY      Family History  Problem Relation Age of Onset   Healthy Mother    Healthy Father    Diabetes Maternal Grandfather    Heart disease Maternal Grandfather    Cancer Maternal Grandfather     Social History Social History   Tobacco Use   Smoking status: Former    Types: Cigars    Passive exposure: Never   Smokeless tobacco: Never   Tobacco comments:    Black and mild (2022) and vapes (quit with preg)  Vaping Use   Vaping status: Former   Substances: Nicotine, Flavoring  Substance Use Topics   Alcohol use: Not Currently    Comment: rarely   Drug use: No    Allergies   Allergen Reactions   Kiwi Extract Anaphylaxis   Aleve [Naproxen Sodium] Other (See Comments)    Stomach bleeds   Food Other (See Comments)    Eggplant - scratchy throat   Mango Flavoring Agent (Non-Screening) Other (See Comments)    Scratchy throat   Pineapple Other (See Comments)    Scratchy throat     Current Outpatient Medications  Medication Sig Dispense Refill   cefadroxil  (DURICEF) 500 MG capsule Take 1 capsule (500 mg total) by mouth 2 (two) times daily. 12 capsule 0   celecoxib  (CELEBREX ) 200 MG capsule Take 1 capsule (200 mg total) by mouth 2 (two) times daily as needed. 30 capsule 0   ondansetron  (ZOFRAN ) 4 MG tablet Take 1 tablet (4 mg total) by mouth every 6 (six) hours. 12 tablet 0   Prenat-Fe Poly-Methfol-FA-DHA (VITAFOL  ULTRA) 29-0.6-0.4-200 MG CAPS Take 1 capsule by mouth daily before breakfast. 34 each 4   HYDROcodone -acetaminophen  (NORCO/VICODIN) 5-325 MG tablet Take 1 tablet by mouth every 6 (six) hours as needed for severe pain. (Patient not taking: Reported on 02/25/2024) 6 tablet 0   methocarbamol  (ROBAXIN ) 500 MG tablet Take 1 tablet (500 mg total) by mouth 2 (two) times daily. (Patient not taking: Reported on 02/25/2024) 20 tablet 0   metroNIDAZOLE  (METROGEL ) 0.75 % vaginal gel Place 1 Applicatorful  vaginally 2 (two) times daily. Use monthly after the period for 6 months. (Patient not taking: Reported on 02/25/2024) 70 g 5   norgestimate -ethinyl estradiol  (ORTHO-CYCLEN) 0.25-35 MG-MCG tablet Take 1 tablet by mouth daily. (Patient not taking: Reported on 02/25/2024) 28 tablet 11   promethazine  (PHENERGAN ) 25 MG suppository Place 1 suppository (25 mg total) rectally every 6 (six) hours as needed for nausea or vomiting. (Patient not taking: Reported on 02/25/2024) 12 each 0   No current facility-administered medications for this visit.    Review of Systems Review of Systems Constitutional: negative for fatigue and weight loss Respiratory: negative for cough and  wheezing Cardiovascular: negative for chest pain, fatigue and palpitations Gastrointestinal: negative for abdominal pain and change in bowel habits Genitourinary:negative Integument/breast: positive for left breast pain.  negative for nipple discharge Musculoskeletal:negative for myalgias Neurological: negative for gait problems and tremors Behavioral/Psych: negative for abusive relationship, depression Endocrine: negative for temperature intolerance      Blood pressure 115/69, pulse 67, height 5' 3 (1.6 m), weight 117 lb 8 oz (53.3 kg), last menstrual period 02/12/2024, not currently breastfeeding.  Physical Exam Physical Exam General:   Alert and no distress  Skin:   no rash or abnormalities  Lungs:   clear to auscultation bilaterally  Heart:   regular rate and rhythm, S1, S2 normal, no murmur, click, rub or gallop  Breasts:   normal without suspicious masses, skin or nipple changes or axillary nodes  The remainder of the physical exam deferred due to the type of encounter.  I have spent a total of 20 minutes of face-to-face time, excluding clinical staff time, reviewing notes and preparing to see patient, ordering tests and/or medications, and counseling the patient.   Data Reviewed Labs  Assessment     1. Mastodynia of left breast (Primary) Rx: - US  LIMITED ULTRASOUND INCLUDING AXILLA LEFT BREAST ; Future     Plan   Follow up in 2 weeks  Orders Placed This Encounter  Procedures   US  LIMITED ULTRASOUND INCLUDING AXILLA LEFT BREAST     [employer] MCD #[id number] T BASELINE/Breast pain, left breast/ NO HX/ NO NEEDS NO BR SXS/ TOMO LC W/PT    Standing Status:   Future    Expected Date:   02/26/2024    Expiration Date:   02/25/2025    Reason for Exam (SYMPTOM  OR DIAGNOSIS REQUIRED):   Breast pain, left breast    Preferred Imaging Location?:   [facility]     CARLIN RONAL CENTERS, MD, FACOG Attending Obstetrician & Gynecologist, [facility], [place] 02/25/2024

## 2024-03-02 ENCOUNTER — Ambulatory Visit
Admission: RE | Admit: 2024-03-02 | Discharge: 2024-03-02 | Disposition: A | Discharge status: Home / Self Care | Source: Ambulatory Visit | Attending: Obstetrics | Admitting: Obstetrics

## 2024-03-02 DIAGNOSIS — N644 Mastodynia: Secondary | ICD-10-CM | POA: Diagnosis not present

## 2024-03-11 ENCOUNTER — Telehealth: Admitting: Physician Assistant

## 2024-03-18 DIAGNOSIS — Z113 Encounter for screening for infections with a predominantly sexual mode of transmission: Secondary | ICD-10-CM | POA: Diagnosis not present

## 2024-03-18 DIAGNOSIS — Z0000 Encounter for general adult medical examination without abnormal findings: Secondary | ICD-10-CM | POA: Diagnosis not present

## 2024-03-18 DIAGNOSIS — Z23 Encounter for immunization: Secondary | ICD-10-CM | POA: Diagnosis not present

## 2024-03-18 DIAGNOSIS — M79644 Pain in right finger(s): Secondary | ICD-10-CM | POA: Diagnosis not present

## 2024-03-19 ENCOUNTER — Encounter: Payer: Self-pay | Admitting: Obstetrics

## 2024-03-23 ENCOUNTER — Telehealth

## 2024-03-23 DIAGNOSIS — N926 Irregular menstruation, unspecified: Secondary | ICD-10-CM

## 2024-03-23 DIAGNOSIS — Z789 Other specified health status: Secondary | ICD-10-CM | POA: Diagnosis not present

## 2024-03-23 MED ORDER — ONDANSETRON HCL 4 MG PO TABS: 4.0000 mg | ORAL_TABLET | Freq: Four times a day (QID) | ORAL | 0 refills | Status: AC

## 2024-03-23 MED ORDER — LETROZOLE 2.5 MG PO TABS: 2.5000 mg | ORAL_TABLET | Freq: Every day | ORAL | 2 refills | Status: AC

## 2024-03-23 NOTE — Progress Notes (Signed)
 F/u from breast ultrasound and fertility medication. Original appt with Dr Rudy had to be rescheduled.

## 2024-03-23 NOTE — Progress Notes (Addendum)
   TELEHEALTH OBSTETRICS VISIT ENCOUNTER NOTE  Provider location: [facility]  Patient location: Home  I connected with Jill Patel on 05/21/24 at 11:15 AM EDT by telephone at home and verified that I am speaking with the correct person using two identifiers. Of note, unable to do video encounter due to technical difficulties.    I discussed the limitations, risks, security and privacy concerns of performing an evaluation and management service by telephone and the availability of in person appointments. I also discussed with the patient that there may be a patient responsible charge related to this service. The patient expressed understanding and agreed to proceed.  Subjective:  HPI F/u breast US : normal, breast pain during mesnse only, has imporved since stoppoing OCP  Desire to conceive - trying for over a year - current partner not same FOB, current partner does not have other children. Pt reports normal semen analysis.  - 2023 conceived w/o assistance - menses have always been irregular - used birth control for 1-18mo to try to regulate, stopped in Jul2025, one cycle since then, 31d - LMP: 8/27-31 - planning to use ovulation test this cycle, has tried this intermittently over the past year with smiley face result   Objective:   Physical Exam Constitutional:      General: She is not in acute distress.    Appearance: She is not ill-appearing.  Cardiovascular:     Rate and Rhythm: Normal rate.  Pulmonary:     Effort: Pulmonary effort is normal.  Neurological:     Mental Status: She is alert.       Assessment & Plan:  1. Attempting to conceive (Primary) 2. Irregular menstrual cycle Ddx: anovulatory cycles, female factors. Normal adnexa on TVUS 12/2023. Appropriate withdrawal bleeding after DC OCP. Has been trying for a little over a year. Patient has conceived w/o assistance before, current partner does not have other children. Patient  states partner testing was WNL. Considered trial ovulation testing and timed intercourse for another 2-22mo now that cycle seems appropriate. Patient not interested in waiting longer, would prefer to try letrozole . Advised to f/u after next cycle.  - letrozole  (FEMARA ) 2.5 MG tablet; Take 1 tablet (2.5 mg total) by mouth daily. Take on days 3 to 7 following a spontaneous menses or progestin-induced bleed.  Dispense: 5 tablet; Refill: 2 - ondansetron  (ZOFRAN ) 4 MG tablet; Take 1 tablet (4 mg total) by mouth every 6 (six) hours.  Dispense: 12 tablet; Refill: 0

## 2024-03-23 NOTE — Progress Notes (Incomplete Revision)
   TELEHEALTH OBSTETRICS VISIT ENCOUNTER NOTE  Provider location: [facility]  Patient location: Home  I connected with Jill Patel on 05/21/24 at 11:15 AM EDT by telephone at home and verified that I am speaking with the correct person using two identifiers. Of note, unable to do video encounter due to technical difficulties.    I discussed the limitations, risks, security and privacy concerns of performing an evaluation and management service by telephone and the availability of in person appointments. I also discussed with the patient that there may be a patient responsible charge related to this service. The patient expressed understanding and agreed to proceed.  Subjective:  HPI F/u breast US : normal, breast pain during mesnse only, has imporved since stoppoing OCP  Desire to conceive - trying for over a year - current partner not same FOB, current partner does not have other children. Pt reports normal semen analysis.  - 2023 conceived w/o assistance - menses have always been irregular - used birth control for 1-18mo to try to regulate, stopped in Jul2025, one cycle since then, 31d - LMP: 8/27-31 - planning to use ovulation test this cycle, has tried this intermittently over the past year with smiley face result   Objective:   Physical Exam Constitutional:      General: She is not in acute distress.    Appearance: She is not ill-appearing.  Cardiovascular:     Rate and Rhythm: Normal rate.  Pulmonary:     Effort: Pulmonary effort is normal.  Neurological:     Mental Status: She is alert.       Assessment & Plan:  1. Attempting to conceive (Primary) 2. Irregular menstrual cycle Ddx: anovulatory cycles, female factors. Normal adnexa on TVUS 12/2023. Appropriate withdrawal bleeding after DC OCP. Has been trying for a little over a year. Patient has conceived w/o assistance before, current partner does not have other children. Patient  states partner testing was WNL. Considered trial ovulation testing and timed intercourse for another 2-22mo now that cycle seems appropriate. Patient not interested in waiting longer, would prefer to try letrozole . Advised to f/u after next cycle.  - letrozole  (FEMARA ) 2.5 MG tablet; Take 1 tablet (2.5 mg total) by mouth daily. Take on days 3 to 7 following a spontaneous menses or progestin-induced bleed.  Dispense: 5 tablet; Refill: 2 - ondansetron  (ZOFRAN ) 4 MG tablet; Take 1 tablet (4 mg total) by mouth every 6 (six) hours.  Dispense: 12 tablet; Refill: 0

## 2024-03-23 NOTE — Progress Notes (Deleted)
  Subjective:     Patient ID: Jill Patel, female   DOB: 1996-06-27, 28 y.o.   MRN: [id number]  HPI   Review of Systems     Objective:   Physical Exam     Assessment:     ***    Plan:     ***

## 2024-06-19 ENCOUNTER — Emergency Department (HOSPITAL_COMMUNITY)
Admission: EM | Admit: 2024-06-19 | Discharge: 2024-06-19 | Disposition: A | Discharge status: Home / Self Care | Attending: Emergency Medicine | Admitting: Emergency Medicine

## 2024-06-19 MED ORDER — SODIUM CHLORIDE 0.9 % IV BOLUS: 500.0000 mL | Freq: Once | INTRAVENOUS | Status: DC
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
## 2024-06-19 NOTE — ED Notes (Signed)
 This nurse attempted to start IV on patient. Patient states she would just like pain medication and discharge. This nurse notified primary nurse and provider.
